# Patient Record
Sex: Female | Born: 1950 | Race: Black or African American | Hispanic: No | Marital: Single | Smoking: Former smoker
Health system: Southern US, Community
[De-identification: ages and names within clinical notes are randomized; demographics above are authoritative.]

## PROBLEM LIST (undated history)

## (undated) DIAGNOSIS — N189 Chronic kidney disease, unspecified: Secondary | ICD-10-CM

## (undated) DIAGNOSIS — G4730 Sleep apnea, unspecified: Secondary | ICD-10-CM

## (undated) DIAGNOSIS — R0602 Shortness of breath: Secondary | ICD-10-CM

## (undated) DIAGNOSIS — N186 End stage renal disease: Secondary | ICD-10-CM

## (undated) DIAGNOSIS — I219 Acute myocardial infarction, unspecified: Secondary | ICD-10-CM

## (undated) DIAGNOSIS — R569 Unspecified convulsions: Secondary | ICD-10-CM

## (undated) DIAGNOSIS — E785 Hyperlipidemia, unspecified: Secondary | ICD-10-CM

## (undated) DIAGNOSIS — J189 Pneumonia, unspecified organism: Secondary | ICD-10-CM

## (undated) DIAGNOSIS — C801 Malignant (primary) neoplasm, unspecified: Secondary | ICD-10-CM

## (undated) DIAGNOSIS — I10 Essential (primary) hypertension: Secondary | ICD-10-CM

## (undated) DIAGNOSIS — M1990 Unspecified osteoarthritis, unspecified site: Secondary | ICD-10-CM

## (undated) DIAGNOSIS — G709 Myoneural disorder, unspecified: Secondary | ICD-10-CM

## (undated) DIAGNOSIS — I2510 Atherosclerotic heart disease of native coronary artery without angina pectoris: Secondary | ICD-10-CM

## (undated) HISTORY — PX: CORONARY ANGIOPLASTY: SHX604

## (undated) HISTORY — PX: OTHER SURGICAL HISTORY: SHX169

## (undated) HISTORY — DX: Hyperlipidemia, unspecified: E78.5

## (undated) HISTORY — DX: Atherosclerotic heart disease of native coronary artery without angina pectoris: I25.10

## (undated) HISTORY — DX: Essential (primary) hypertension: I10

## (undated) HISTORY — PX: ABDOMINAL HYSTERECTOMY: SHX81

## (undated) HISTORY — PX: NECK SURGERY: SHX720

## (undated) HISTORY — PX: ROTATOR CUFF REPAIR: SHX139

## (undated) HISTORY — DX: End stage renal disease: N18.6

---

## 1988-05-07 DIAGNOSIS — J189 Pneumonia, unspecified organism: Secondary | ICD-10-CM

## 1988-05-07 HISTORY — DX: Pneumonia, unspecified organism: J18.9

## 1998-05-07 HISTORY — PX: OTHER SURGICAL HISTORY: SHX169

## 2000-02-11 ENCOUNTER — Encounter: Payer: Self-pay | Admitting: *Deleted

## 2000-02-11 ENCOUNTER — Inpatient Hospital Stay (HOSPITAL_COMMUNITY): Admission: EM | Admit: 2000-02-11 | Discharge: 2000-02-14 | Payer: Self-pay | Admitting: *Deleted

## 2000-05-07 ENCOUNTER — Encounter: Admission: RE | Admit: 2000-05-07 | Discharge: 2000-05-07 | Payer: Self-pay | Admitting: Cardiology

## 2000-06-07 ENCOUNTER — Encounter: Admission: RE | Admit: 2000-06-07 | Discharge: 2000-06-07 | Payer: Self-pay | Admitting: Cardiology

## 2000-07-23 ENCOUNTER — Encounter: Admission: RE | Admit: 2000-07-23 | Discharge: 2000-07-23 | Payer: Self-pay | Admitting: Emergency Medicine

## 2000-07-29 ENCOUNTER — Encounter: Admission: RE | Admit: 2000-07-29 | Discharge: 2000-07-29 | Payer: Self-pay | Admitting: Internal Medicine

## 2003-02-12 ENCOUNTER — Ambulatory Visit (HOSPITAL_COMMUNITY): Admission: RE | Admit: 2003-02-12 | Discharge: 2003-02-12 | Payer: Self-pay | Admitting: Internal Medicine

## 2003-02-12 ENCOUNTER — Encounter: Payer: Self-pay | Admitting: Internal Medicine

## 2003-09-10 ENCOUNTER — Ambulatory Visit (HOSPITAL_COMMUNITY): Admission: RE | Admit: 2003-09-10 | Discharge: 2003-09-10 | Payer: Self-pay | Admitting: Internal Medicine

## 2004-05-15 ENCOUNTER — Ambulatory Visit: Payer: Self-pay | Admitting: Cardiology

## 2004-07-04 ENCOUNTER — Ambulatory Visit (HOSPITAL_BASED_OUTPATIENT_CLINIC_OR_DEPARTMENT_OTHER): Admission: RE | Admit: 2004-07-04 | Discharge: 2004-07-04 | Payer: Self-pay | Admitting: Ophthalmology

## 2004-07-11 ENCOUNTER — Ambulatory Visit (HOSPITAL_COMMUNITY): Admission: RE | Admit: 2004-07-11 | Discharge: 2004-07-11 | Payer: Self-pay | Admitting: Internal Medicine

## 2005-02-22 ENCOUNTER — Ambulatory Visit (HOSPITAL_COMMUNITY): Admission: RE | Admit: 2005-02-22 | Discharge: 2005-02-22 | Payer: Self-pay | Admitting: Unknown Physician Specialty

## 2005-03-02 ENCOUNTER — Encounter: Admission: RE | Admit: 2005-03-02 | Discharge: 2005-03-02 | Payer: Self-pay | Admitting: Unknown Physician Specialty

## 2005-03-16 ENCOUNTER — Encounter: Admission: RE | Admit: 2005-03-16 | Discharge: 2005-03-16 | Payer: Self-pay | Admitting: Unknown Physician Specialty

## 2005-04-09 ENCOUNTER — Encounter: Admission: RE | Admit: 2005-04-09 | Discharge: 2005-04-09 | Payer: Self-pay | Admitting: Unknown Physician Specialty

## 2005-05-22 ENCOUNTER — Ambulatory Visit: Payer: Self-pay | Admitting: *Deleted

## 2005-08-09 ENCOUNTER — Ambulatory Visit (HOSPITAL_COMMUNITY): Admission: RE | Admit: 2005-08-09 | Discharge: 2005-08-09 | Payer: Self-pay | Admitting: Internal Medicine

## 2005-10-07 ENCOUNTER — Inpatient Hospital Stay (HOSPITAL_COMMUNITY): Admission: EM | Admit: 2005-10-07 | Discharge: 2005-10-09 | Payer: Self-pay | Admitting: Cardiology

## 2005-10-07 ENCOUNTER — Encounter: Payer: Self-pay | Admitting: Emergency Medicine

## 2005-10-07 ENCOUNTER — Ambulatory Visit: Payer: Self-pay | Admitting: *Deleted

## 2005-10-23 ENCOUNTER — Ambulatory Visit: Payer: Self-pay | Admitting: *Deleted

## 2005-11-09 ENCOUNTER — Emergency Department (HOSPITAL_COMMUNITY): Admission: EM | Admit: 2005-11-09 | Discharge: 2005-11-09 | Payer: Self-pay | Admitting: Emergency Medicine

## 2005-11-24 ENCOUNTER — Emergency Department (HOSPITAL_COMMUNITY): Admission: EM | Admit: 2005-11-24 | Discharge: 2005-11-24 | Payer: Self-pay | Admitting: Emergency Medicine

## 2005-12-13 ENCOUNTER — Ambulatory Visit: Payer: Self-pay | Admitting: Orthopedic Surgery

## 2006-01-08 ENCOUNTER — Encounter: Admission: RE | Admit: 2006-01-08 | Discharge: 2006-01-08 | Payer: Self-pay | Admitting: Orthopedic Surgery

## 2006-01-11 ENCOUNTER — Ambulatory Visit: Payer: Self-pay | Admitting: Cardiology

## 2006-01-24 ENCOUNTER — Ambulatory Visit: Payer: Self-pay | Admitting: Orthopedic Surgery

## 2006-01-28 ENCOUNTER — Encounter: Admission: RE | Admit: 2006-01-28 | Discharge: 2006-01-28 | Payer: Self-pay | Admitting: Orthopedic Surgery

## 2006-02-21 ENCOUNTER — Encounter: Admission: RE | Admit: 2006-02-21 | Discharge: 2006-02-21 | Payer: Self-pay | Admitting: Orthopedic Surgery

## 2006-03-04 ENCOUNTER — Encounter: Admission: RE | Admit: 2006-03-04 | Discharge: 2006-03-04 | Payer: Self-pay | Admitting: Orthopaedic Surgery

## 2006-04-03 ENCOUNTER — Encounter: Admission: RE | Admit: 2006-04-03 | Discharge: 2006-04-03 | Payer: Self-pay | Admitting: Orthopaedic Surgery

## 2006-07-30 ENCOUNTER — Ambulatory Visit (HOSPITAL_BASED_OUTPATIENT_CLINIC_OR_DEPARTMENT_OTHER): Admission: RE | Admit: 2006-07-30 | Discharge: 2006-07-31 | Payer: Self-pay | Admitting: Orthopedic Surgery

## 2006-11-12 ENCOUNTER — Ambulatory Visit: Payer: Self-pay | Admitting: Cardiovascular Disease

## 2006-11-12 ENCOUNTER — Ambulatory Visit (HOSPITAL_COMMUNITY): Admission: RE | Admit: 2006-11-12 | Discharge: 2006-11-12 | Payer: Self-pay | Admitting: Family Medicine

## 2006-11-19 ENCOUNTER — Ambulatory Visit: Payer: Self-pay | Admitting: Cardiovascular Disease

## 2006-11-19 ENCOUNTER — Encounter (HOSPITAL_COMMUNITY): Admission: RE | Admit: 2006-11-19 | Discharge: 2006-12-19 | Payer: Self-pay | Admitting: Cardiovascular Disease

## 2006-11-21 ENCOUNTER — Ambulatory Visit (HOSPITAL_COMMUNITY): Admission: RE | Admit: 2006-11-21 | Discharge: 2006-11-21 | Payer: Self-pay | Admitting: Cardiovascular Disease

## 2006-11-26 ENCOUNTER — Ambulatory Visit: Payer: Self-pay | Admitting: Cardiovascular Disease

## 2006-11-26 ENCOUNTER — Inpatient Hospital Stay (HOSPITAL_BASED_OUTPATIENT_CLINIC_OR_DEPARTMENT_OTHER): Admission: RE | Admit: 2006-11-26 | Discharge: 2006-11-26 | Payer: Self-pay | Admitting: Cardiovascular Disease

## 2007-01-03 ENCOUNTER — Inpatient Hospital Stay (HOSPITAL_COMMUNITY): Admission: RE | Admit: 2007-01-03 | Discharge: 2007-01-04 | Payer: Self-pay | Admitting: Orthopaedic Surgery

## 2007-02-11 ENCOUNTER — Ambulatory Visit: Payer: Self-pay | Admitting: Cardiovascular Disease

## 2007-10-31 ENCOUNTER — Ambulatory Visit: Payer: Self-pay | Admitting: Cardiovascular Disease

## 2007-11-14 ENCOUNTER — Ambulatory Visit (HOSPITAL_COMMUNITY): Admission: RE | Admit: 2007-11-14 | Discharge: 2007-11-14 | Payer: Self-pay | Admitting: Family Medicine

## 2008-07-29 ENCOUNTER — Ambulatory Visit: Payer: Self-pay | Admitting: Cardiology

## 2008-09-29 DIAGNOSIS — E782 Mixed hyperlipidemia: Secondary | ICD-10-CM | POA: Insufficient documentation

## 2008-09-29 DIAGNOSIS — I2510 Atherosclerotic heart disease of native coronary artery without angina pectoris: Secondary | ICD-10-CM | POA: Insufficient documentation

## 2008-09-29 DIAGNOSIS — I10 Essential (primary) hypertension: Secondary | ICD-10-CM

## 2008-10-01 ENCOUNTER — Ambulatory Visit: Payer: Self-pay | Admitting: Cardiovascular Disease

## 2008-11-17 ENCOUNTER — Other Ambulatory Visit: Admission: RE | Admit: 2008-11-17 | Discharge: 2008-11-17 | Payer: Self-pay | Admitting: Orthopaedic Surgery

## 2009-04-28 ENCOUNTER — Emergency Department (HOSPITAL_COMMUNITY): Admission: EM | Admit: 2009-04-28 | Discharge: 2009-04-28 | Payer: Self-pay | Admitting: Emergency Medicine

## 2009-06-07 ENCOUNTER — Ambulatory Visit (HOSPITAL_COMMUNITY): Admission: RE | Admit: 2009-06-07 | Discharge: 2009-06-07 | Payer: Self-pay | Admitting: Family Medicine

## 2009-06-07 ENCOUNTER — Encounter (INDEPENDENT_AMBULATORY_CARE_PROVIDER_SITE_OTHER): Payer: Self-pay | Admitting: *Deleted

## 2009-06-07 LAB — CONVERTED CEMR LAB
Alkaline Phosphatase: 67 units/L
CO2: 21 meq/L
Creatinine, Ser: 1.07 mg/dL
Free T4: 11.1 ng/dL
Glucose, Bld: 273 mg/dL
TSH: 1.488 microintl units/mL

## 2009-06-10 ENCOUNTER — Encounter (INDEPENDENT_AMBULATORY_CARE_PROVIDER_SITE_OTHER): Payer: Self-pay | Admitting: *Deleted

## 2009-06-22 ENCOUNTER — Ambulatory Visit: Payer: Self-pay | Admitting: Cardiovascular Disease

## 2009-10-28 ENCOUNTER — Emergency Department (HOSPITAL_COMMUNITY): Admission: EM | Admit: 2009-10-28 | Discharge: 2009-10-28 | Payer: Self-pay | Admitting: Emergency Medicine

## 2009-10-30 ENCOUNTER — Emergency Department (HOSPITAL_COMMUNITY): Admission: EM | Admit: 2009-10-30 | Discharge: 2009-10-30 | Payer: Self-pay | Admitting: Emergency Medicine

## 2010-05-28 ENCOUNTER — Encounter: Payer: Self-pay | Admitting: Orthopedic Surgery

## 2010-06-06 NOTE — Assessment & Plan Note (Signed)
Summary: past due for 6 mth f/u per pt phone call/tg   Visit Type:  Follow-up Primary Provider:  DR.DONDIEGO  CC:  NO COMPLAINTS.  History of Present Illness: [name] returns today for followup.  She has known coronary disease.  Shehas a total right coronary artery with collaterals.  She does not havecritical left-sided disease.  I recall doing her catheterization in July2008.  She did not have a lot of insight into her coronary disease.  Itook pictures for and explained to her her anatomy. Her BS have been well controlled according to her.  She does not know her A1c but it was just checked cy [facility]go.  She Denies SSCP, palpitations, or dyspnea.  She has been compliant with her meds.  She is active.  With a birthday coming up on the 29[birth year].    Current Problems (verified): 1)  Mixed Hyperlipidemia  (ICD-272.2) 2)  Hypertension  (ICD-401.9) 3)  Cad  (ICD-414.00)  Current Medications (verified): 1)  Vytorin 10-10 Mg Tabs (Ezetimibe-Simvastatin) .... Take 1 Tab Daily 2)  Cosopt 2-0.5 % Soln (Dorzolamide-Timolol) .... Place 1 Drop Each Eye Two Times A Day 3)  Fish Oil Maximum Strength 1200 Mg Caps (Omega-3 Fatty Acids) .... Take 1 Cap Daily 4)  Imdur 30 Mg Xr24h-Tab (Isosorbide Mononitrate) .... Take 1 Tab Daily 5)  Metoprolol Succinate 100 Mg Xr24h-Tab (Metoprolol Succinate) .... Take 1 Tab Daily 6)  Metformin Hcl 500 Mg Tabs (Metformin Hcl) .... Take 1 Tab Daily 7)  Hydrochlorothiazide 25 Mg Tabs (Hydrochlorothiazide) .... Take 1 Tab Daily 8)  Aspirin Adult Low Strength 81 Mg Tbec (Aspirin) .... Take 1 Tab Daily 9)  Nitro-Dur 0.4 Mg/hr Pt24 (Nitroglycerin) .... Take As Needed For Chestpain 10)  Altace 2.5 Mg Caps (Ramipril) .... Take 1 Tab Daily 11)  Daily Multi  Tabs (Multiple Vitamins-Minerals) .... Take 1 Tab Daily  Allergies (verified): No Known Drug Allergies  Past History:  Past Medical History: Last updated: 09/29/2008 Current Problems:  MIXED HYPERLIPIDEMIA  (ICD-272.2) HYPERTENSION (ICD-401.9) CAD (ICD-414.00)  Past Surgical History: Last updated: 09/29/2008 cath (ptca +stent) mid RCA EF 40-45%  Family History: Last updated: 09/29/2008 Father:alive with cad Mother:deceased  Social History: Last updated: 09/29/2008 Single  Tobacco Use - Former.  Alcohol Use - no Regular Exercise - no Drug Use - yes  Review of Systems       Denies fever, malais, weight loss, blurry vision, decreased visual acuity, cough, sputum, SOB, hemoptysis, pleuritic pain, palpitaitons, heartburn, abdominal pain, melena, lower extremity edema, claudication, or rash. All other systems reviewed and negative  Vital Signs:  Patient profile:   60 year old female Height:      59 inches Weight:      164 pounds BMI:     33.24 Pulse rate:   79 / minute BP sitting:   134 / 75  (right arm)  Vitals Entered By: Doretha Sou, CNA (June 22, 2009 9:40 AM)  Physical Exam  General:  Affect appropriate Healthy:  appears stated age 10: normal Neck supple with no adenopathy JVP normal no bruits no thyromegaly Lungs clear with no wheezing and good diaphragmatic motion Heart:  S1/S2 no murmur,rub, gallop or click PMI normal Abdomen: benighn, BS positve, no tenderness, no AAA no bruit.  No HSM or HJR Distal pulses intact with no bruits No edema Neuro non-focal Skin warm and dry    Impression & Recommendations:  Problem # 1:  CAD (ICD-414.00) Stable no aninga continue ASA and BB Her updated medication list for  this problem includes:    Imdur 30 Mg Xr24h-tab (Isosorbide mononitrate) .Marland Kitchen... Take 1 tab daily    Metoprolol Succinate 100 Mg Xr24h-tab (Metoprolol succinate) .Marland Kitchen... Take 1 tab daily    Aspirin Adult Low Strength 81 Mg Tbec (Aspirin) .Marland Kitchen... Take 1 tab daily    Nitro-dur 0.4 Mg/hr Pt24 (Nitroglycerin) .Marland Kitchen... Take as needed for chestpain    Altace 2.5 Mg Caps (Ramipril) .Marland Kitchen... Take 1 tab daily  Problem # 2:  HYPERTENSION (ICD-401.9) WEll controlled  continue ACE given DM Her updated medication list for this problem includes:    Metoprolol Succinate 100 Mg Xr24h-tab (Metoprolol succinate) .Marland Kitchen... Take 1 tab daily    Hydrochlorothiazide 25 Mg Tabs (Hydrochlorothiazide) .Marland Kitchen... Take 1 tab daily    Aspirin Adult Low Strength 81 Mg Tbec (Aspirin) .Marland Kitchen... Take 1 tab daily    Altace 2.5 Mg Caps (Ramipril) .Marland Kitchen... Take 1 tab daily  Problem # 3:  MIXED HYPERLIPIDEMIA (ICD-272.2) Will check labs from primary office.  Disucssed low carb diet with patient Her updated medication list for this problem includes:    Vytorin 10-10 Mg Tabs (Ezetimibe-simvastatin) .Marland Kitchen... Take 1 tab daily  CHOL: 221 (06/07/2009)   HDL: 31 (06/07/2009)   TG: 485 (06/07/2009)  Patient Instructions: 1)  Your physician recommends that you schedule a follow-up appointment in: 6 months

## 2010-06-06 NOTE — Miscellaneous (Signed)
Summary: LABS CMP,LIPIDS,TSH,T4 06/07/09  Clinical Lists Changes

## 2010-06-06 NOTE — Miscellaneous (Signed)
Summary: LABS CMP,LIPIDS,A1C,T4,TSH 06/07/2009  Clinical Lists Changes  Observations: Added new observation of CALCIUM: 10.0 mg/dL (06/07/2009 14:48) Added new observation of SGPT (ALT): 45 units/L (06/07/2009 14:48) Added new observation of SGOT (AST): 26 units/L (06/07/2009 14:48) Added new observation of ALK PHOS: 67 units/L (06/07/2009 14:48) Added new observation of CREATININE: 1.07 mg/dL (06/07/2009 14:48) Added new observation of BUN: 22 mg/dL (06/07/2009 14:48) Added new observation of BG RANDOM: 273 mg/dL (06/07/2009 14:48) Added new observation of CO2 PLSM/SER: 21 meq/L (06/07/2009 14:48) Added new observation of CL SERUM: 101 meq/L (06/07/2009 14:48) Added new observation of K SERUM: 4.4 meq/L (06/07/2009 14:48) Added new observation of NA: 137 meq/L (06/07/2009 14:48) Added new observation of HDL: 31 mg/dL (06/07/2009 14:48) Added new observation of TRIGLYC TOT: 485 mg/dL (06/07/2009 14:48) Added new observation of CHOLESTEROL: 221 mg/dL (06/07/2009 14:48) Added new observation of TSH: 1.488 microintl units/mL (06/07/2009 14:48) Added new observation of T4, FREE: 11.1 ng/dL (06/07/2009 14:48) Added new observation of HGBA1C: 11.6 % (06/07/2009 14:48)

## 2010-07-12 ENCOUNTER — Ambulatory Visit (INDEPENDENT_AMBULATORY_CARE_PROVIDER_SITE_OTHER): Payer: Medicare Other | Admitting: Cardiovascular Disease

## 2010-07-12 ENCOUNTER — Encounter: Payer: Self-pay | Admitting: Cardiovascular Disease

## 2010-07-12 DIAGNOSIS — I10 Essential (primary) hypertension: Secondary | ICD-10-CM

## 2010-07-12 DIAGNOSIS — I2510 Atherosclerotic heart disease of native coronary artery without angina pectoris: Secondary | ICD-10-CM

## 2010-07-12 DIAGNOSIS — E782 Mixed hyperlipidemia: Secondary | ICD-10-CM

## 2010-07-12 DIAGNOSIS — E119 Type 2 diabetes mellitus without complications: Secondary | ICD-10-CM

## 2010-07-12 DIAGNOSIS — E7800 Pure hypercholesterolemia, unspecified: Secondary | ICD-10-CM

## 2010-07-12 NOTE — Progress Notes (Signed)
[name] returns today for followup.  She has known coronary disease.  Shehas a total right coronary artery with collaterals.  She does not havecritical left-sided disease.  I recall doing her catheterization in July2008.  She did not have a lot of insight into her coronary disease.  Itook pictures for and explained to her her anatomy. .  She Denies SSCP, palpitations, or dyspnea.  She has been compliant with her meds.  She is active.   ]She indicates that her BS in better controlled with A1c down to 8 from 11.  Walks with her mini daschund zodie   ROS: Denies fever, malais, weight loss, blurry vision, decreased visual acuity, cough, sputum, SOB, hemoptysis, pleuritic pain, palpitaitons, heartburn, abdominal pain, melena, lower extremity edema, claudication, or rash.   General:  Affect appropriate Healthy:  appears stated age 10: normal Neck supple with no adenopathy JVP normal no bruits no thyromegaly Lungs clear with no wheezing and good diaphragmatic motion Heart:  S1/S2 no murmur,rub, gallop or click PMI normal Abdomen: benighn, BS positve, no tenderness, no AAA no bruit.  No HSM or HJR Distal pulses intact with no bruits No edema Neuro non-focal Skin warm and dry No muscular weakness

## 2010-07-12 NOTE — Assessment & Plan Note (Signed)
Well controlled continue vytorin

## 2010-07-12 NOTE — Progress Notes (Deleted)
Subjective:      Patient ID: [name] is a 60 y.o. female.  Chief Complaint: HPI {Common ambulatory SmartLinks:[id number]} ROS    Objective:    Physical Exam  Lab Review:  {Recent 0000000 applicable"}    Assessment:     No diagnosis found.   Plan:     ***

## 2010-07-12 NOTE — Patient Instructions (Signed)
F/U Dr [name] 12 months

## 2010-07-12 NOTE — Assessment & Plan Note (Signed)
Stable no angina.  Continue nitrates given collaterals

## 2010-07-12 NOTE — Assessment & Plan Note (Signed)
Well controlled continue ACE and other meds

## 2010-07-18 NOTE — Assessment & Plan Note (Signed)
Summary: 6 mth f/u per pt call/tmj/tg   Visit Type:  Follow-up Primary Provider:  DR.DONDIEGO  CC:  some sob.  History of Present Illness: [name] returns today for followup.  She has known coronary disease.  Shehas a total right coronary artery with collaterals.  She does not havecritical left-sided disease.  I recall doing her catheterization in July2008.  She did not have a lot of insight into her coronary disease.  Itook pictures for and explained to her her anatomy. Her BS have been well controlled according to her.  She does not know her A1c but it was just checked cy DonDiego.  She Denies SSCP, palpitations, or dyspnea.  She has been compliant with her meds.  She is active.    Current Problems (verified): 1)  Mixed Hyperlipidemia  (ICD-272.2) 2)  Hypertension  (ICD-401.9) 3)  Cad  (ICD-414.00)  Current Medications (verified): 1)  Vytorin 10-10 Mg Tabs (Ezetimibe-Simvastatin) .... Take 1 Tab Daily 2)  Cosopt 2-0.5 % Soln (Dorzolamide-Timolol) .... Place 1 Drop Each Eye Two Times A Day 3)  Fish Oil Maximum Strength 1200 Mg Caps (Omega-3 Fatty Acids) .... Take 1 Cap Daily 4)  Imdur 30 Mg Xr24h-Tab (Isosorbide Mononitrate) .... Take 1 Tab Daily 5)  Metoprolol Succinate 100 Mg Xr24h-Tab (Metoprolol Succinate) .... Take 1 Tab Daily 6)  Metformin Hcl 500 Mg Tabs (Metformin Hcl) .... Take 1 Tab Two Times A Day 7)  Hydrochlorothiazide 25 Mg Tabs (Hydrochlorothiazide) .... Take 1/2 Tab Daily 8)  Aspirin Adult Low Strength 81 Mg Tbec (Aspirin) .... Take 1 Tab Daily 9)  Nitro-Dur 0.4 Mg/hr Pt24 (Nitroglycerin) .... Take As Needed For Chestpain 10)  Altace 10 Mg Caps (Ramipril) .... Take 1 Tab Daily 11)  Daily Multi  Tabs (Multiple Vitamins-Minerals) .... Take 1 Tab Daily 12)  Nabumetone 750 Mg Tabs (Nabumetone) .... Take 1 Tab Two Times A Day 13)  Glipizide 5 Mg Tabs (Glipizide) .... Take 1 Tab Daily 14)  Co Q-10 200 Mg Caps (Coenzyme Q10) .... Take 1 Tab Daily 15)  L-Carnitine 500 Mg Tabs  (Levocarnitine) .... Take 1 Tab Daily 16)  Calcium-Magnesium-Zinc 333-133-8.3 Mg Tabs (Calcium-Magnesium-Zinc) .... Take 1 Tab Daily 17)  Daily Multi  Tabs (Multiple Vitamins-Minerals) .... Take 1 Tab Daily 18)  Cinnamon 500 Mg Caps (Cinnamon) .... Take 1 Tab Daily 19)  Chromium 200 Mcg Tabs (Chromium) .... Take 1 Tab Daily 20)  Alpha-Lipoic Acid 50 Mg Caps (Alpha-Lipoic Acid) .... Take 1 Tab Daily 21)  Lutein 22)  Ginkgo Biloba 40 Mg Caps (Ginkgo Biloba) .... Take 1 Tab Daily 23)  Vitamin B-12 1000 Mcg Tabs (Cyanocobalamin) .... Take 1 Tab Daily  Allergies (verified): No Known Drug Allergies  Comments:  Nurse/Medical Assistant: brought meds and list [facility]  Past History:  Past Medical History: Last updated: 09/29/2008 Current Problems:  MIXED HYPERLIPIDEMIA (ICD-272.2) HYPERTENSION (ICD-401.9) CAD (ICD-414.00)  Past Surgical History: Last updated: 09/29/2008 cath (ptca +stent) mid RCA EF 40-45%  Family History: Last updated: 09/29/2008 Father:alive with cad Mother:deceased  Social History: Last updated: 09/29/2008 Single  Tobacco Use - Former.  Alcohol Use - no Regular Exercise - no Drug Use - yes  Review of Systems       Denies fever, malais, weight loss, blurry vision, decreased visual acuity, cough, sputum, SOB, hemoptysis, pleuritic pain, palpitaitons, heartburn, abdominal pain, melena, lower extremity edema, claudication, or rash.   Vital Signs:  Patient profile:   60 year old female Weight:  164 pounds BMI:     33.24 Pulse rate:   88 / minute BP sitting:   146 / 83  (left arm)  Vitals Entered By: Doretha Sou, CNA (July 12, 2010 10:04 AM)  Physical Exam  General:  Affect appropriate Healthy:  appears stated age 56: normal Neck supple with no adenopathy JVP normal no bruits no thyromegaly Lungs clear with no wheezing and good diaphragmatic motion Heart:  S1/S2 no murmur,rub, gallop or click PMI  normal Abdomen: benighn, BS positve, no tenderness, no AAA no bruit.  No HSM or HJR Distal pulses intact with no bruits No edema Neuro non-focal Skin warm and dry    Impression & Recommendations:  Problem # 1:  MIXED HYPERLIPIDEMIA (ICD-272.2)  Continue Vytorin  Discussed low fat diet.  F/U labs Dr Cindie Laroche Her updated medication list for this problem includes:    Vytorin 10-10 Mg Tabs (Ezetimibe-simvastatin) .Marland Kitchen... Take 1 tab daily  CHOL: 221 (06/07/2009)   HDL: 31 (06/07/2009)   TG: 485 (06/07/2009)  Her updated medication list for this problem includes:    Vytorin 10-10 Mg Tabs (Ezetimibe-simvastatin) .Marland Kitchen... Take 1 tab daily  Problem # 2:  HYPERTENSION (ICD-401.9) Well controlled  Her updated medication list for this problem includes:    Metoprolol Succinate 100 Mg Xr24h-tab (Metoprolol succinate) .Marland Kitchen... Take 1 tab daily    Hydrochlorothiazide 25 Mg Tabs (Hydrochlorothiazide) .Marland Kitchen... Take 1/2 tab daily    Aspirin Adult Low Strength 81 Mg Tbec (Aspirin) .Marland Kitchen... Take 1 tab daily    Altace 10 Mg Caps (Ramipril) .Marland Kitchen... Take 1 tab daily  Problem # 3:  CAD (ICD-414.00)  Total RCA with collaterals  No angina  Continue asa, bb and nitrates Her updated medication list for this problem includes:    Imdur 30 Mg Xr24h-tab (Isosorbide mononitrate) .Marland Kitchen... Take 1 tab daily    Metoprolol Succinate 100 Mg Xr24h-tab (Metoprolol succinate) .Marland Kitchen... Take 1 tab daily    Aspirin Adult Low Strength 81 Mg Tbec (Aspirin) .Marland Kitchen... Take 1 tab daily    Nitro-dur 0.4 Mg/hr Pt24 (Nitroglycerin) .Marland Kitchen... Take as needed for chestpain    Altace 10 Mg Caps (Ramipril) .Marland Kitchen... Take 1 tab daily  Her updated medication list for this problem includes:    Imdur 30 Mg Xr24h-tab (Isosorbide mononitrate) .Marland Kitchen... Take 1 tab daily    Metoprolol Succinate 100 Mg Xr24h-tab (Metoprolol succinate) .Marland Kitchen... Take 1 tab daily    Aspirin Adult Low Strength 81 Mg Tbec (Aspirin) .Marland Kitchen... Take 1 tab daily    Nitro-dur 0.4 Mg/hr Pt24 (Nitroglycerin)  .Marland Kitchen... Take as needed for chestpain    Altace 10 Mg Caps (Ramipril) .Marland Kitchen... Take 1 tab daily

## 2010-07-23 LAB — CULTURE, ROUTINE-ABSCESS

## 2010-08-07 LAB — SEDIMENTATION RATE: Sed Rate: 12 mm/hr (ref 0–22)

## 2010-08-09 ENCOUNTER — Other Ambulatory Visit: Payer: Self-pay | Admitting: Orthopaedic Surgery

## 2010-08-09 ENCOUNTER — Other Ambulatory Visit (HOSPITAL_COMMUNITY)
Admission: RE | Admit: 2010-08-09 | Discharge: 2010-08-09 | Disposition: A | Discharge status: Home / Self Care | Payer: Medicare Other | Source: Ambulatory Visit | Attending: Orthopaedic Surgery | Admitting: Orthopaedic Surgery

## 2010-08-09 DIAGNOSIS — M6790 Unspecified disorder of synovium and tendon, unspecified site: Secondary | ICD-10-CM | POA: Insufficient documentation

## 2010-08-09 DIAGNOSIS — M719 Bursopathy, unspecified: Secondary | ICD-10-CM | POA: Insufficient documentation

## 2010-09-19 NOTE — Assessment & Plan Note (Signed)
[name]   [name], [name]                      MRN:          [id number]  DATE:11/12/2006                            DOB:          Sep 26, 1950    [name] is seen today as a new patient by me.  She has previously  been seen by Dr. Wilhemina Cash, and most recently by Dr. Verl Blalock in September  2007.   She had a heart catheterization in June 2007.  At that time, she had  left-to-right and right-to-right collaterals to an occluded right.  She  had a previous stent there which was totally occluded.  She had a 60%  moderate mid LAD lesion.  Her EF was 50% with inferior akinesis.  She  has not had a followup stress test since then.  She has cervical spine  problems, and is actually preoperative for possible disk surgery in  August or September.  She has not had any significant chest pain.  She  has mild exertional dyspnea, which is stable.  She is able to walk quite  well.  She, unfortunately, does not typically get angina, and mostly had  shortness of breath and diaphoresis with her MI.   REVIEW OF SYSTEMS:  The patient has noted increasing lower extremity  edema.  She has had some chronic left lower extremity edema from  previous fasciitis from a boil 5 years ago, but she has noted increased  swelling in the right ankle.  There has been no history of DVT.  She has  been fairly compliant with her diabetic diet, but does use salt.  Review  of systems otherwise negative.   CURRENT MEDICATIONS:  1. Vytorin 10/10.  2. Altace 10 a day.  3. Metformin 1 b.i.d.  4. She has stopped her Actos, and is now on Januvia.  She does not      know the dose.  5. Imdur 30 a day.  6. Hydrochlorothiazide 12.5 a day.  7. Lantus as indicated 15 units at night.  8. Lopressor 100 mg b.i.d.   EXAMINATION:  Remarkable for a fairly healthy-appearing middle aged  black female in no distress.  Affect is appropriate.  Weight is actually down from 198 in September 2007 to 182.  Blood  pressure is 124/78.  Respiratory rate is 14.  Pulse 78 and regular.  HEENT:  Normal.  There is no thyromegaly.  No lymphadenopathy.  No bruits.  NECK:  Supple.  There is no JVP elevation.  LUNGS:  Clear with good diaphragmatic motion.  There is an S1 and S2 with normal heart sounds.  PMI is normal.  ABDOMEN:  Benign.  Bowel sounds are positive.  No hepatosplenomegaly.  No hepatojugular reflux.  No tenderness.  No organomegaly.  Femorals are +2 bilaterally.  She has a tattoo in the medial aspect of  her left ankle.  She has +1 edema on the left and +2 edema, particularly  on the outside of the lateral malleolus in the right leg.  She has a  peripheral neuropathy below the ankles.  Neurologic was otherwise  nonfocal.  There  was no muscular weakness.   IMPRESSION:  1. Coronary artery disease.  My biggest concern is that she has      moderate left anterior descending disease from previous      catheterization, which supplies collateral to the right.  She does      not have a good warning sign, and she is preoperative.  She will      have an adenosine Myoview.  She will continue her aspirin and beta      blocker therapy.  2. Decreased left ventricular function.  Continue ACE inhibitor.  We      will reassess her left ventricular function by Myoview.  She may      need a followup echo.  3. Lower extremity edema.  It sounds dependent on the right side.  She      may have had some ankle injury, as there is fairly focal swelling      outside of the ankle.  For the time being, we will increase her      hydrochlorothiazide to 25 a day.  I will see her back after her      stress test, and we may do a BMET and BNP to further assess this.  4. Diabetes.  I am glad she is off her Actos.  She will continue her      current medications and follow up with Dr. Legrand Rams for quarterly      hemoglobin A1c.  5. Mixed hyperlipidemia.  Stable.   Continue low-fat diet.  Continue      fish oil and Vytorin.  Again, I will see her back after her stress      test to further assess her risk for surgical spine surgery.     [name]. [name], MD, [facility]  Electronically Signed    PCN/MedQ  DD: 11/12/2006  DT: 11/12/2006  Job #: [id number]

## 2010-09-19 NOTE — Assessment & Plan Note (Signed)
[facility] OFFICE NOTE   Amanda, May                      MRN:          [id number]  DATE:10/01/2008                            DOB:          February 16, 1951    [name] returns today for followup.  She has known coronary disease.  She  has a total right coronary artery with collaterals.  She does not have  critical left-sided disease.  I recall doing her catheterization in July  2008.  She did not have a lot of insight into her coronary disease.  I  took pictures for and explained to her her anatomy.   She likes to come to the [facility] as Dr. Cindie Laroche is her medical  doctor.   She has not had a Myoview study since July 2008 when she had her heart  cath.   She is active.  She is under a little bit less stress as her daughter  moved out and she is living in an apartment by herself now.  Her sugar  apparently has been reasonably controlled.  She has not had a change in  her medication.  Unfortunately, she continues to smoke.  I counseled her  on this for less than 10 minutes and I explained to her the relationship  between coronary disease progression and smoking.  She only smokes a  pack every 2 days.  She was just given her prescription for Chantix by  Dr. Cindie Laroche, and I concur with this therapy for her.  I warned her  about sleeping problems and nightmares.   She was seen to be a good candidate for Chantix, otherwise, she is  fairly sedentary.  She is not getting chest pain, PND, or orthopnea.  There has been no diaphoresis.  There has been trace lower extremity  edema.   Her past medical history is otherwise remarkable for hypertension,  diabetes, mixed hyperlipidemia, known coronary artery disease with a  total right coronary artery that is collateralized.   She lives by herself.  She has family in the area.  She is on disability  for previous fasciitis and left lower extremity.  She  smokes a pack  every 2 days and does not drink.   MEDICATIONS:  1. She is on Vytorin 10/10.  2. Altace 10 mg a day.  3. Fish oil.  4. Imdur 60 mg a day.  5. Metoprolol 100 a day.  6. Januvia 100 mics daily.  7. Metformin 500 b.i.d.  8. HCTZ 25 a day.  9. Relafen.   PHYSICAL EXAMINATION:  GENERAL:  Her exam is remarkable for an  overweight black female in no distress.  Affect is appropriate.  VITAL SIGNS:  Weight is 168, blood pressure 140/80, pulse 77 regular,  respiratory rate 14, afebrile.  HEENT:  Unremarkable.  Carotids are normal without bruit.  No  lymphadenopathy, thyromegaly, or JVP elevation.  LUNGS:  Clear.  Good diaphragmatic motion.  No wheezing.  S1 and S2 with  normal heart sounds.  PMI normal.  ABDOMEN:  Benign.  Bowel sounds positive.  No AAA.  No tenderness.  No  bruit.  No hepatosplenomegaly, hepatojugular reflux.  EXTREMITIES:  Edema 1+ bilaterally.  NEURO:  Nonfocal.  SKIN:  Warm and dry.  MUSCULOSKELETAL:  No muscular weakness.   EKG shows sinus rhythm with poor R-wave progression and previous  inferior wall MI.   IMPRESSION:  1. Coronary disease, previous coronary artery occlusion of the right      with collaterals not having symptoms consider followup Myoview in      the year.  Continue aspirin and beta-blocker.  2. Diabetes.  Followup with Dr. Cindie Laroche.  Hemoglobin A1c quarterly.      Continue current dose of medications.  3. Lower extremity edema, chronic low-sodium diet.  Continue current      dose of HCTZ.  4. Hyperlipidemia.  She tells me Dr. Cindie Laroche checked her liver and      lipid profile and they were within range.  Continue Vytorin.  No      side effects.  5. Hypertension currently well-controlled.  Continue current dose of      Altace.  6. Smoking cessation.  The patient will start Chantix.  She will      follow up with Dr. Cindie Laroche.  She was counseled for less than 10      minutes in smoking cessation.     [name]. [name], MD,  [facility]  Electronically Signed    PCN/MedQ  DD: 10/01/2008  DT: 10/01/2008  Job #: [id number]

## 2010-09-19 NOTE — Cardiovascular Report (Signed)
NAMEANAIE, SEAMON               ACCOUNT NO.:  [id number]   MEDICAL RECORD NO.:  [id number]          PATIENT TYPE:  OIB   LOCATION:  1962                         FACILITY:  [facility]   PHYSICIAN:  Peter C. [name], MD, FACCDATE OF BIRTH:  1951-03-22   DATE OF PROCEDURE:  DATE OF DISCHARGE:                            CARDIAC CATHETERIZATION   PROGRESS NOTE:  Coronary arteriography   INDICATIONS:  Abnormal Myoview with known coronary artery disease.   Cine catheterization was done from the right femoral artery and vein.  A  peripheral IV could not be started.  We therefore placed a 5-French  venous sheath   Left main coronary artery was normal.   Left anterior descending artery had a smooth 50% tubular lesion in the  midvessel.  Distal vessel had 20-30% multiple discrete lesions.  There  were two small diagonal branches without focal lesions.   Circumflex coronary artery primarily consisted of an AV groove branch  and a single obtuse marginal branch.  There was no critical disease.   There is significant left-to-right collaterals to the distal right  coronary artery.   The right coronary artery was known to be totally occluded.  The patient  had the entire mid and distal right coronary artery previously stented.  There was trickle flow through a portion of the stents, however there is  primarily right to right collaterals to the distal vessel.   RAO VENTRICULOGRAPHY:  RAO ventriculography showed minimal inferobasal  hypokinesis, mild left ventricular cavity enlargement.  There is no  significant MR.  The EF was in the 50% range.   Aortic pressure was 160/74, LV pressure was 164/10.   IMPRESSION:  The patient's LAD disease is stable. It actually looks less  stenotic than previously described.  It is very smooth area. I do not  think there is any indication for bypass surgery at this time.   The patient's proximal right coronary artery occlusion is chronic.  She  has good right  to right and left-to-right collaterals.  Continued  medical therapy is warranted.   The patient tolerated the procedure well.      [name]. [name], MD, [facility]  Electronically Signed     PCN/MEDQ  D:  11/26/2006  T:  11/26/2006  Job:  [id number]

## 2010-09-19 NOTE — Op Note (Signed)
NAMECORNIE, BOSCH               ACCOUNT NO.:  [id number]   MEDICAL RECORD NO.:  [id number]          PATIENT TYPE:  INP   LOCATION:  5030                         FACILITY:  [facility]   PHYSICIAN:  Rennis Harding, M.D.        DATE OF BIRTH:  06/25/1950   DATE OF PROCEDURE:  01/03/2007  DATE OF DISCHARGE:  01/04/2007                               OPERATIVE REPORT   DIAGNOSIS:  Cervical spondylotic myeloradiculopathy.   PROCEDURE:  1. Anterior cervical diskectomy C3-4, C4-5 and C5-6 with decompression      of the spinal cord and nerve roots bilaterally.  2. Anterior cervical arthrodesis C3-4, C4-5 and C5-6 with placement of      3 PEEK cages, 1 at each level.  3. Anterior cervical plating C3 through C6 using the [employer] Spine      System.  4. Local autogenous bone graft, supplemented with 1-mL Grafton      allograft.   SURGEON:  Rennis Harding, M.D.   ASSISTANT:  Karenann Cai, P.A.   ANESTHESIA:  General endotracheal.   ESTIMATED BLOOD LOSS:  Minimal.   COMPLICATIONS:  None.   NEEDLE AND SPONGE COUNT:  Correct.   INDICATIONS:  Patient is a pleasant 60 year old female with progressive  neck and upper extremity myeloradiculopathy.  Her imaging study shows  severe disk degeneration and spinal stenosis, most advanced at C3-4, C4-  5 and C5-6, secondary to spondylosis and disk herniations.  She has  failed other attempts at conservative treatment modalities and now  presents for ACDF.  Risks, benefits and alternatives were reviewed and  the patient elected to proceed.   PROCEDURE:  After informed consent, she was taken to the operating room.  She underwent general endotracheal anesthesia without difficulty, given  prophylactic IV antibiotics.  Carefully positioned supine on the  operating room table with Mayfield headrest, 5 pounds halter traction  applied.  The neck was prepped and draped in the usual sterile fashion.  A transverse incision was made in the natural skin crease at the  level  of the thyroid cartilage.  Dissection was carried sharply to the  platysma, and the interval between the SCM and strap muscles medially  was developed down to the prevertebral space.  A spinal needle was then  placed at C3-4.  Intraoperative x-ray was taken to confirm the level.  The esophagus, trachea and carotid sheath were identified and protected  at all times.  The longus colli muscle was elevated out over the C3-4,  C4-5, C5-6 disk spaces bilaterally.  Deep retractors were placed.  Leksell rongeur was then used to remove the large anterior osteophytes  from the disk spaces.  The microscope was then draped and brought into  the field.  Caspar retraction pins were placed at C3, C4, C5, C6.  Starting at C3-4 level, general distraction was applied using the [employer].  Radical diskectomy completed.  The disk material was  degenerative and the disk space was collapsed.  High-speed bur was used  to take down the anterior vertebral joint and posterior vertebral  margins.  This bone was  saved for later autografting.  Using Micro-  Kerrisons and curettes, the posterior longitudinal ligament was taken  down and a subligamentous disk herniation was decompressed from the  spinal canal, which was compressing the thecal sac.  The vertebral  margins were undercut.  When we were done, we had a good decompression  of the cord at this level.  We prepared the cartilaginous endplates for  the arthrodesis.  A 6-mm PEEK cage was packed with the local bone graft  obtained from the drill and also from the anterior osteophytes.  This  was packed with the bone graft, along with 1 mL of allograft paste.  Once the cage was packed of the bone graft mixture, it was inserted in  the interspace and countersunk 1 mm.  We then performed a similar  procedure at C4-5.  At this level, the disk herniation was found to be  larger.  It had migrated up behind the vertebral body, and this was  removed using  a nerve hook.  The vertebral margins were undercut and  wide foraminotomies were again completed.  We again used a 6-mm PEEK  cage at this level, which was packed with the bone graft mixture.  The  cage was inserted into the interspace and countersunk 1 mm.  We then  moved down to the C5-6 level.  Again, the disk material was  degenerative.  The posterior longitudinal ligament was taken down and  the wide foraminotomies were completed.  The posterior vertebral margins  were undercut.  We again found subligamentous disk, which was  compressing the cord.  We were satisfied with the decompression.  We  placed a 6-mm PEEK cage at this level, as well, again with the mixture  of local bone graft and Grafton.  The PEEK cage was countersunk 1 mm.  We then placed a 53-mm anterior cervical plate from C3 to C6.  We used  eight 12-mm screws.  The screw purchase was good, and we ensured that  the locking mechanism engaged.  The wound was irrigated.  Intraoperative  x-ray was taken, which showed appropriate positioning of the  instrumentation from C3 to C6.  The lower portion was hard to visualize  due to the patient's body habitus.  The esophagus, trachea and carotid  sheath were inspected and there were no apparent injuries.  Meticulous  hemostasis was achieved.  A deep TLS drain was left in place.  The  platysma was closed with interrupted 2-0 Vicryl, subcutaneous layer  closed with interrupted 3-0 Vicryl followed by a running 4-0  subcuticular Vicryl suture on the skin edge.  Dermabond was applied.  Cervical collar placed.  The patient was extubated without difficulty  and transferred to recovery in stable condition.   I had originally discussed with the patient using a left anterior iliac  crest bone graft.  While we were prepping her, I noted significant  scarring in her groin and left lower extremity from a previous  necrotizing fasciitis.  In addition, she had a very protuberant abdomen,  and  I was concerned about making an incision in this area, with the  possibility of her developing a postoperative infection.  We, therefore,  elected to use the local bone graft and Grafton mixture.   My assistant, Karenann Cai, P.A., was present throughout the procedure,  including the positioning and the exposure.  She assisted me under the  microscope, providing suction and retraction.  She also assisted with  the wound closure.  Rennis Harding, M.D.  Electronically Signed     MC/MEDQ  D:  01/03/2007  T:  01/04/2007  Job:  [id number]

## 2010-09-19 NOTE — Assessment & Plan Note (Signed)
[place] CARDIOLOGY OFFICE NOTE   MADDYSON, DOSSANTOS                      MRN:          [id number]  DATE:02/11/2007                            DOB:          [birth year]    [name] returns today for followup, and the last time I saw her she  needed pre-op clearance for neck surgery with Dr. Patrice Paradise.  She has an  occluded right coronary artery with collaterals and had moderate LAD  disease.  We also treat her for hypercholesterolemia and edema.   Her heart catheterization was done on November 26, 2006.  She had an  occluded right coronary artery which was filled with good collaterals.  The LAD had a 50% tubular lesion in the mid-vessel, which was quite  smooth and non-flow limiting.  Her EF was 50%.  We cleared her for  surgery.  She did fine.  She was very happy with the result and has much  less pain.   REVIEW OF SYSTEMS:  Currently remarkable for a boil under her right arm.  She is on Avelox for this, and it seems to be improving.  I  congratulated her.  She has lost about 40 pounds over the past year.  She is not having any significant angina, and as indicated her neck  seems to be improving. Edema is baseline on current dose of diuretic.   REVIEW OF SYSTEMS:  Otherwise negative.   CURRENT MEDICATIONS:  Include:  1. Vytorin 10/40.  2. Altace 10 mg a day.  3. Metformin 1 gram b.i.d.  4. Lantus 10 units q.h.s.  5. Fish oil.  6. Imdur 30 a day.  7. Hydrochlorothiazide 12.5 a day.  8. Lopressor 50 b.i.d.  9. Januvia 100 a day.   EXAMINATION:  GENERAL:  Remarkable for a healthy-appearing black female  in no distress.  VITAL SIGNS:  Her blood pressure is 130/70, pulse 68 and regular.  Weight is down to 165.  Respiratory rate is 12.  She is afebrile.  HEENT:  Normal.  NECK:  Carotids normal without bruits.  There is no lymphadenopathy, no  thyromegaly, no JVP elevation.  She has an anterior cervical scar from  her recent surgery.  LUNGS:  Clear.  Good diaphragmatic motion, no wheezing.  There is an S1,  S2 with normal heart sounds.  PMI is normal.  ABDOMEN:  Benign.  Bowel sounds are positive.  No tenderness, no AAA, no  bruits.  No hepatosplenomegaly or hepatojugular reflux.  She is status  post hysterectomy.  EXTREMITIES:  Femorals are +3 bilaterally without bruits.  Cath site is  well healed.  Distal pulses are intact.  She has a tattoo in her left  ankle area.  There is trace lower extremity edema, which is also  markedly improved on hydrochlorothiazide.  NEURO:  Nonfocal.  There is no muscular weakness.  SKIN:  Warm and dry.   IMPRESSION:  1. Coronary disease, stable, 50% LAD disease with collateralized      right.  No complications during surgery.  Continue aspirin and beta      blocker.  2. Hypertension,  currently well controlled.  Continue low-salt diet.      Current dose of Altace is adequate.  3. Hyperlipidemia.  Continue Vytorin [id number], particularly in light of      known coronary disease.  Follow-up lipid and liver profile in 6      months.  4. Boil under right arm, not specifically examined today.  Follow up      with Dr. Legrand Rams, continue Avelox.  She seems to think it is      improving.  5. Cervical spine problems.  Follow up with Dr. Rennis Harding.  Continue      physical therapy and rehab.  Significant improvement with      kyphoplasty.  6. Diabetes.  Continue current doses of oral hypoglycemic and Lantus.      Follow-up hemoglobin A1c quarterly.  See eye doctor on a yearly      basis.  7. Lower extremity edema.  Continue hydrochlorothiazide on the current      dose, markedly improved.  Again, I congratulated the patient on her      marked weight loss over the last 6 months.  I will see her back in      6 month's time.     [name]. [name], MD, [facility]  Electronically Signed    PCN/MedQ  DD: 02/11/2007  DT: 02/11/2007  Job #: [id number]

## 2010-09-19 NOTE — Assessment & Plan Note (Signed)
[name]   [name], [name]                      MRN:          [id number]  DATE:10/31/2007                            DOB:          Nov 01, 1950    [name] returns today for followup.  She has known coronary artery  disease.  We cathed her in July 2008.  At that time, she had an occluded  right coronary artery, which was chronic with 50% LAD disease.  She has  been treated medically.   She has done extremely well.  She is using a diet that involves high  protein and juicing and she has lost about 30 pounds.  I congratulated  her on this.  She is now off Lantus and only on Januvia and metformin  for her sugars.  She has been compliant with her other medications.   She has had a couple of episodes of chest pain since I last saw her;  however, they do not sound anginal.  They were at rest.  They were with  lying down.  They may be related to reflux or dysmotility.  She is  walking on a regular basis with her cane and does not get chest pain.  She had been complaint with her medications.   Her review of systems otherwise is remarkable for improved diabetes  control, the boil under her right arm and her axilla is gone and she is  no longer on antibiotics.  She had a recent trip to [place] to see a  relative with uterine cancer and developed shingles.  This has resolved.   Otherwise, negative.  She is on Vytorin 10/10, Cosopt eye drops, Altace  10 a day, fish oil, Imdur 30 a day, metoprolol 50 b.i.d., Januvia 100 a  day, metformin 500 b.i.d., HCTZ 12.5 b.i.d., and baby aspirin a day.   PHYSICAL EXAMINATION:  GENERAL:  Her exam is remarkable for an  overweight black female.  Weight is down to 166, blood pressure 112/72,  pulse 79 and regular, respiratory rate 14, afebrile.  HEENT:  Unremarkable.  Carotids are normal without bruit.  No  lymphadenopathy, thyromegaly, or JVP elevation.  Status  post anterior  cervical surgery.  LUNGS:  Clear, good diaphragmatic motion.  No wheezing.  CARDIAC:  S1 and S2 with normal heart sounds.  PMI normal.  ABDOMEN:  Benign, bowel sounds positive, no AAA, no bruit, no  hepatosplenomegaly, no hepatojugular reflux.  EXTREMITIES:  Distal pulses are intact.  No edema.  NEURO:  Nonfocal.  SKIN:  Warm and dry.  No muscular weakness.   IMPRESSION:  1. Coronary artery disease, chronic total occlusion of the right with      collaterals, 50% LAD disease.  Continue aspirin, beta blocker, and      long-acting nitrates, probable follow-up stress test in a year.  2. Hypercholesterolemia.  Continue Vytorin 10/10.  Follow up lipid and      liver profile with Dr. Lorriane Shire.  3. Diabetes, improved, controlled with weight loss.  Continue current      diet.  Continue Januvia and metformin.  Hemoglobin A1c  quarterly.  4. Lower extremity edema, improved, dependent, much better with weight      loss.  Continue HCTZ b.i.d., elevate legs at the end of the day.  5. Hypertension, currently well controlled.  Continue Altace 10 mg a      day, particularly in part due to its protein-sparing effect with      her diabetes.  She will be seen in followup in 6 months' time.     [name]. [name], MD, [facility]  Electronically Signed    PCN/MedQ  DD: 10/31/2007  DT: 11/01/2007  Job #: [id number]

## 2010-09-19 NOTE — Assessment & Plan Note (Signed)
[place] CARDIOLOGY OFFICE NOTE   [name], [name]                      MRN:          [id number]  DATE:07/29/2008                            DOB:          12-16-1950    CARDIOLOGIST:  Previously, Dr. Johnsie May in the [facility].   PRIMARY CARE PHYSICIAN:  Amanda Curet, MD   REASON FOR VISIT:  Routine followup.   HISTORY OF PRESENT ILLNESS:  [name] is a 60 year old female patient  with a history of coronary disease status post prior inferior myocardial  infarction in 2001 treated with PCI of the RCA who subsequently  underwent cardiac catheterization in 2007, that demonstrated occlusive  in-stent restenosis of the RCA.  She has been treated medically.  She  last underwent cardiac catheterization by Dr. Johnsie May in July 2008.  At  that time, she had stable anatomy with chronically occluded RCA, left to  right collaterals, 50% mid and 20-30% distal LAD stenosis.  Her last  stress test was in July 2008, that demonstrated inferior wall infarct  with a suggestion of apical ischemia.  Her EF was 58%.  She returns  today for routine followup.  Since last being seen, she has noted some  episodes of chest discomfort.  She describes left-sided tightness when  she exerted herself outside in the cooler temperatures in the fall.  This was with walking.  She did not have to stop and her symptoms  subsided while she continued to walk.  She did not take any  nitroglycerin.  She did not have any arm or jaw symptoms.  She has had  some slight shortness of breath with exertion, but this seems to be  stable without significant changes.  She denies any symptoms at rest.  She does have some back pain from time to time that concerns her.  She  does take nitroglycerin for this.  However, her symptoms subside with  changes in positioning.  She denies orthopnea, PND, or pedal edema.  She  denies syncope.  She has been  walking on the treadmill over the winter  without any exertional chest symptoms.  She has been walking outside for  the last couple of weeks without any exertional chest symptoms.   CURRENT MEDICATIONS:  Vytorin 10 mg daily, Cosopt eye drops b.i.d.,  Altace 10 mg daily, Fish oil 1200 mg b.i.d., Imdur 30 mg daily,  Metoprolol 100 mg daily, Januvia 100 mg daily,  Metformin 500 mg b.i.d., Hydrochlorothiazide 25 mg daily, Aspirin 81 mg  daily, Albuterol p.r.n., Nitroglycerin p.r.n.   SOCIAL HISTORY:  She continues to smoke cigarettes.   REVIEW OF SYSTEMS:  She has occasional left hip pain that limits her  walking up and down steps.   PHYSICAL EXAMINATION:  GENERAL:  She is a well-nourished well-developed  female in no acute distress.  VITAL SIGNS:  Blood pressure is 120/80, pulse 76, weight 164 pounds.  HEENT:  Normal.  NECK:  Without JVD.  CARDIAC:  Normal S1 and S2.  Regular rate and rhythm.  No murmur.  LUNGS:  Clear to auscultation bilaterally.  No wheezing, no  rhonchi, no  rales.  ABDOMEN:  Soft, nontender.  EXTREMITIES:  Without edema.  NEUROLOGIC:  She is alert and oriented x3.  Cranial nerves II-XII  grossly intact.  VASCULAR:  No carotid bruits noted bilaterally.  Distal pulses are  diminished bilaterally.  SKIN:  Warm and dry.   Electrocardiogram demonstrates normal sinus rhythm with a heart rate of  75, inferior Q-waves, poor R-wave progression, interventricular  conduction delay, no acute changes.   ASSESSMENT AND PLAN:  1. Chest discomfort in a 60 year old female patient with a history of      coronary artery disease status post prior inferior myocardial      infarction, treated with the right coronary artery stenting, and      subsequently cardiac catheterization demonstrating chronically      occluded right coronary artery with left to right collaterals, and      mild-to-moderate nonobstructive disease in the LAD with overall      preserved left ventricular  function.  She certainly has anatomy to      explain chronic exertional angina.  Overall, her symptoms are mild.      I have reassured her about her back pain today.  This seems to be      musculoskeletal.  I have recommended increasing her Imdur from 30-      60 mg a day.  She will follow up in several months.  I have asked      to return sooner if she notes an escalation of her symptoms.  2. Dyslipidemia.  This is followed by Dr. Lorriane May.  We will request      recent labs.  Her goal LDL is less than or equal to 70.  3. Diabetes mellitus.  She tells me that she is having problems      controlling her sugars.  I have recommended she look into the [employer] to help with food choices and controlling her sugars.      She will continue to follow up with Dr. Lorriane May for medical      management of her diabetes mellitus.  4. Hypertension.  This is well controlled.  5. Tobacco abuse.  I have recommended she discontinue smoking.   DISPOSITION:  The patient will be brought back in followup in the next 3  months with me or Dr. Johnsie May or sooner p.r.n.      Amanda Dopp, PA-C  Electronically Signed      [name]. Amanda Haw, MD, [facility]  Electronically Signed   SW/MedQ  DD: 07/29/2008  DT: 07/30/2008  Job #: [id number]   cc:   Amanda Lightning, MD

## 2010-09-22 NOTE — Cardiovascular Report (Signed)
NAMEMELISHA, [name]               ACCOUNT NO.:  [id number]   MEDICAL RECORD NO.:  [id number]          PATIENT TYPE:  INP   LOCATION:  2033                         FACILITY:  [facility]   PHYSICIAN:  Ethelle Lyon, M.D. LHCDATE OF BIRTH:  June 21, 1950   DATE OF PROCEDURE:  10/08/2005  DATE OF DISCHARGE:                              CARDIAC CATHETERIZATION   PROCEDURE:  Left heart catheterization, left ventriculography, coronary  angiography.   INDICATIONS:  [name] is a 60 year old woman with prior coronary disease  for which she is status post placement of two overlapping bare metal stents  in the right coronary artery over an area of 60 mm back in 2001.  She now  presents with at least several months of exertional chest discomfort and  then an episode of chest discomfort occurring at rest on Saturday which  prompted admission to the hospital.  She ruled out for myocardial infarction  by serial enzymes and electrocardiograms.  Electrocardiogram showed possible  old inferior infarction without acute change.  She was referred for  diagnostic angiography.   PROCEDURAL TECHNIQUE:  Informed consent was obtained.  Under 1% lidocaine  local anesthesia, a 5-French sheath was placed in the right common femoral  artery using the modified Seldinger technique.  Diagnostic angiography and  ventriculography were performed using JL-4, JR-4, and pigtail catheters.  The patient tolerated the procedure well, was transferred to the holding  room in stable condition.  Sheath will be removed there.   COMPLICATIONS:  None.   FINDINGS:  1.  LV 155/19/26.  EF 50% with inferior akinesis sparing the apex.  2.  No aortic stenosis or mitral regurgitation.  3.  Left main:  Angiographically normal.  4.  LAD:  The LAD is a large vessel reaching the apex of the heart and      giving rise to three diagonals.  The first diagonal is a very large      vessel and supplies essentially the entirety of the lateral  wall in a      fashion much like a large obtuse marginal.  The mid LAD has a 60%      stenosis spanning the region between the second and third diagonals.  5.  Circumflex:  Very small vessel which is angiographically normal.  6.  RCA:  Moderate-sized dominant vessel.  There is a previously placed      stent throughout the mid vessel.  The beginning of the stents has an      area of approximately 15 mm of total occlusion.  The vessel then      reconstitutes via collaterals.  The remainder of the stented segment has      90% in-stent restenosis throughout its length.  The distal vessel      receives collaterals from both the right and the left which are fairly      modest.   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  She also has a moderate  stenosis of the mid left anterior descending coronary artery.  The inferior  wall appears infarcted.  She may well have some peri-infarct ischemia to  account for her angina.  We will attempt medical therapy for this.  We will  continue her beta blocker.  We will add long-acting nitrate and add  hydrochlorothiazide with the goal of improving her blood pressure control.      Ethelle Lyon, M.D. [facility]  Electronically Signed     WED/MEDQ  D:  10/08/2005  T:  10/09/2005  Job:  [id number]   cc:   Tesfaye D. Legrand Rams, MD  Fax: [phone]   Scarlett Presto, M.D.  Fax: [phone]

## 2010-09-22 NOTE — Op Note (Signed)
[name], [name]               ACCOUNT NO.:  [id number]   MEDICAL RECORD NO.:  [id number]          PATIENT TYPE:  AMB   LOCATION:  [place]                          FACILITY:  [facility]   PHYSICIAN:  Youlanda Mighty. Sypher, M.D. DATE OF BIRTH:  [birth year]   DATE OF PROCEDURE:  07/30/2006  DATE OF DISCHARGE:                               OPERATIVE REPORT   PREOPERATIVE DIAGNOSIS:  Chronic pain right shoulder with clinical  examination and magnetic resonance imaging suggesting a full-thickness  rotator cuff tear with chronic subacromial impingement, mild  acromioclavicular arthropathy and possible adhesive capsulitis.   POSTOPERATIVE DIAGNOSIS:  Confirmed adhesive capsulitis right shoulder  with stage III subacromial impingement, mild acromioclavicular  arthropathy, labral degeneration and a full-thickness rotator cuff tear  involving the conjoint tendon.   OPERATION:  1. Examination of right shoulder under anesthesia.  2. Arthroscopic evaluation glenohumeral joint followed by arthroscopic      debridement of labrum and adhesive capsulitis tissues.  3. Subacromial decompression with bursectomy, coracoacromial ligament      relaxation and acromioplasty.  4. Open repair of a degenerative retracted tear of the conjoint      tendon.   OPERATING SURGEON:  Theodis Sato, M.D.   ASSISTANT:  Herbie Baltimore Dasnoit PA-C.   ANESTHESIA:  General endotracheal supplemented by right interscalene  block, supervising anesthesiologist is Dr. Maryjean Morn.   INDICATION:  [name] is a 60 year old woman referred by Dr. Rennis May and Dr. Legrand Rams of [facility] for evaluation and management of a  painful right shoulder.  [name] had a number of orthopedic concerns  after a fall.  She has a complex previous medical history with a history  of chronic spinal pain, type 2 diabetes, hypertension and possible  glaucoma.  She has a remote history of fasciitis involving a leg that  required skin grafting.   She was  referred to see Dr. Patrice Paradise for evaluation of spinal complaints.  During her evaluation by Dr. Patrice Paradise, she noted right shoulder pain.  Plain x-rays and MRI obtained by Dr. Patrice Paradise confirmed the presence of a  degenerative right rotator cuff tear.  Dr. Patrice Paradise referred her for  evaluation and management of her shoulder.   Preoperatively she was seen by Dr. Verl Blalock, her attending cardiologist and  was cleared for anesthesia.  She now presents for evaluation and  management of her chronic right shoulder predicament.   After informed consent, she is brought to the operating room at this  time.   Preoperatively she was provided 1 gram of Ancef as an IV prophylactic  antibiotic.  In view of her history of prior necrotizing fasciitis, I  will also provide vancomycin in the perioperative period to be certain  that she is not a MRSA carrier.   PROCEDURE:  Caroly Pagel is brought to the operating room and placed in  the supine position on the operating table.   Following an anesthesia consult by Dr. Maryjean Morn, a right interscalene block  was placed for perioperative analgesia.  [name] was transferred to  room six, placed in supine position on the operating table and under Dr.  Cruz direct supervision general endotracheal anesthesia induced.   [name] was carefully positioned in the beach-chair position with  __________ torso and head holders __________  arthroscopy.  The  procedure commenced with examination of the right shoulder under  anesthesia.  She was noted have significant adhesive capsulitis with  inability to extend the shoulder to neutral.  She can externally rotate  at 90 degrees abduction only 70 degrees and abduct approximately 160  degrees.  With gentle manipulation her abduction arc was increased to  175 degrees, extension increased to neutral, external rotation to 90  degrees and internal rotation 70 degrees.   The right arm and forequarter were prepped with DuraPrep and draped  with  impervious arthroscopy drapes.  The shoulder was then instrumented  through a standard posterior viewing portal with a arthroscope using a  blunt trocar.  Diagnostic arthroscopy confirmed adhesive capsulitis,  granulation tissue, a small full-thickness rotator cuff tear involving  the conjoint tendon and significant labral degeneration.   An anterior portal was created under direct vision followed by  debridement of the labrum, adhesive capsulitis tissues and deep surface  rotator cuff tear tissues.   After photographic documentation of the anatomy with a digital camera,  the scope was removed from the glenohumeral joint and placed in the  subacromial space.  Subacromial space was obliterated by florid  bursitis.  This was cleared with a suction shaver followed by release of  the coracoacromial ligament.  The acromion was leveled to a type 1  morphology with the suction bur.  The [facility] joint capsule was not violated  and the distal clavicle not particularly prominent.   The bursal side of the cuff was carefully inspected.  There was a full-  thickness tear involving the conjoint tendon measuring 2.5 cm from  anterior to posterior dimension and the full-thickness of the cuff.  This was debrided with the suction shaver and the cortical bone of the  greater tuberosity debrided to a bleeding edge.   We contemplated possible arthroscopic repair.  However, due to the  significant mesomorphic characteristics of Ms. Mangual, I elected to  proceed with an open incision to facilitate proper suture placement.   Following removal of the arthroscopic equipment, we fashioned a 4 cm  scission and proceeded through the subcutaneous tissue to the deltoid  which was split over a distance of 2.5 cm.  The cuff tear was  immediately visualized.  A power bur was used to decorticate the greater  tuberosity followed by placement of a McLaughlin through bone mattress  suture.   The mattress suture was  placed through bone tunnels and tensioned with  the arm in abduction.  This anatomically repaired the conjoint tendon  tear.   The margin of the acromion was feathered with a rasp creating a very  smooth margin.  The deltoid split was then repaired with a corner suture  of #2 FiberWire repaired to intact fascia of the deltoid anteriorly.  The deltoid split laterally was repaired with simple suture of 0 Vicryl.   Subcutaneous tissues were gathered with multiple interrupted sutures of  0 Vicryl followed by repair of the skin with intradermal 3-0 Prolene and  Steri-Strips.   Ms. Gleed was placed in a compressive dressing and sling.  She was  awakened from general anesthesia and transferred to recovery with stable  signs.   Given her history of MRSA infection in the past, she will be provided  prophylactic vancomycin as well as IV Ancef  as well as p.o. and IV  Dilaudid as a postoperative analgesic.   There were no apparent complications.      Youlanda Mighty Sypher, M.D.  Electronically Signed     RVS/MEDQ  D:  07/30/2006  T:  07/30/2006  Job:  [id number]   cc:   Rennis May, M.D.  Dr. Legrand Rams

## 2010-09-22 NOTE — Op Note (Signed)
NAME:  [name], [name]                         ACCOUNT NO.:  [id number]   MEDICAL RECORD NO.:  [id number]                   PATIENT TYPE:  AMB   LOCATION:  DAY                                  FACILITY:  [facility]   PHYSICIAN:  R. Garfield Cornea, M.D.              DATE OF BIRTH:  Dec 03, 1950   DATE OF PROCEDURE:  09/10/2003  DATE OF DISCHARGE:                                 OPERATIVE REPORT   PROCEDURE:  Colonoscopy with biopsy.   INDICATIONS FOR PROCEDURE:  The patient is a 60 year old lady sent over the  courtesy of Dr. Legrand Rams for colorectal cancer screening.  She is devoid of any  lower GI tract symptoms, aside from occasional constipation and laxative  use.   She tells me that she underwent a screening colonoscopy at age 76 at [facility] in [place], and no abnormalities were found.  There is no  family history of colorectal neoplasia.  Colonoscopy is now being done as a  screening maneuver.  This approach has been discussed with the patient at  length.  The potential risks, benefits, and alternatives have been reviewed  and questions answered.  She is agreeable.  Please see my handwritten H&P  for more information.   PROCEDURE:  O2 saturation, blood pressure, pulses, and respirations were  monitored throughout the entirety of the procedure.  Conscious sedation was  with Versed 4 mg IV, Demerol 100 mg IV in divided doses.  The instrument  used was the Olympus video chip system.   FINDINGS:  Digital rectal examination revealed no abnormalities.   ENDOSCOPIC FINDINGS:  The prep was good.   Rectum:  Examination of the rectal mucosa including retroflex view of the  anal verge revealed no abnormalities.   Colon:  The colonic mucosa was surveyed from the rectosigmoid junction  through the left, transverse, right colon to the area of the appendiceal  orifice, ileocecal valve, and cecum.  These structures were well-seen and  photographed for the record.  From this level, the  scope was slowly  withdrawn.  All previously mentioned mucosal surfaces were again seen.  The  patient was noted to have (1) melanosis coli, (2) a 5-mm polyp in the mid-  descending colon at 45 cm.  It was cold biopsy/removed.  Otherwise, the  colonic mucosa appeared normal.  The patient tolerated the procedure well  and was reactive in endoscopy.   IMPRESSION:  1. Normal rectum.  2. Melanosis coli.  3. Diminutive polyp in the mid-descending colon at 45 cm cold     biopsy/removed.  The remainder of the colonic mucosa appeared normal.   RECOMMENDATIONS:  1. Follow up on pathology.  2. Further recommendations to follow.      ___________________________________________  Bridgette Habermann, M.D.   RMR/MEDQ  D:  09/10/2003  T:  09/10/2003  Job:  [id number]   cc:   Tesfaye D. Legrand Rams, M.D.  [address]  [place]  [place] [address]  Fax: [phone]

## 2010-09-22 NOTE — Assessment & Plan Note (Signed)
[facility] OFFICE NOTE   Amanda, May                      MRN:          [id number]  DATE:01/11/2006                            DOB:          1950-08-19    [name] comes in today for follow up of her coronary artery disease. She  was admitted for chest pain and ruled out for myocardial infarction in June.  Cardiac catheterization showed an occluded proximal stent in the right  coronary artery with left-to-left, and right-to-right collaterals.  She had  a 60% moderate stenosis in the mid LAD,  EF 50% with inferior akinesia.  Her  circumflex had no obstructive disease.   She is having no angina.   MEDICATIONS:  1. Vytorin 10/10 daily.  2. Eye drops.  3. Altace 10 mg daily.  4. Metformin 1000 b.i.d.  5. Lantus 17 units q.h.s.  6. Fish oil 1200 mg b.i.d.  7. Actos 45 mg daily.  8. Imdur 30 mg daily.  9. Hydrochlorothiazide 12.5 daily.  10.Metoprolol 50 b.i.d.   PHYSICAL EXAMINATION:  VITAL SIGNS:  Her blood pressure is 98/62, pulse 70  and regular. Her weight is 198, up 12.  NECK:  Shows no JVD. Carotids are full without bruits, there is no  thyromegaly. Trachea is midline.  LUNGS:  Clear.  HEART:  Reveals a regular rate and rhythm. There is a normal S2.  ABDOMEN:  Soft with good bowel sounds.  EXTREMITIES:  No clubbing, cyanosis, 1+ edema at the ankles. Pulses are  intact.   ASSESSMENT AND PLAN:  [name] is doing well.  I am concerned about her  weight gain some of which may be due to Actos which seems to be added  recently. We talked about the Avandia controversy and it may spill over into  Actos.  She will discuss this with Dr. Legrand Rams.  If her blood sugars can be  controlled with metformin, I would probably opt for that at this point in  time.   I will plan on seeing her back in June of 2008.                                   Thomas C. Verl Blalock, MD, [facility]   TCW/MedQ  DD:   01/11/2006  DT:  01/11/2006  Job #:  [id number]   cc:   Tesfaye D. Legrand Rams, MD

## 2010-09-22 NOTE — Discharge Summary (Signed)
[place]. [facility]  Patient:    [name], [name]                      MRN: [id number] Adm. Date:  [id number] Disc. Date: 02/14/00 Attending:  Christy Sartorius Dictator:   Ermalinda Barrios, P.A.C.                  Referring Physician Discharge [place]  ADMITTING DIAGNOSIS:  Acute myocardial infarction.  DISCHARGE DIAGNOSES: 1. Status post diaphragmatic myocardial infarction, treated with stenting of    the right coronary artery with residual 50-60% left anterior descending    artery, ejection fraction 40-45%. 2. Diabetes mellitus. 3. Smoker. 4. Status post hysterectomy.  BRIEF HISTORY AND PHYSICAL:  Please see H&P for details.  This is a 60 year old black female patient with diabetes for the past five years, but has not been followed by a doctor since moving here two years ago to take care of her father.  She presented to [facility] approximately midnight complaining of substernal chest pain that had gone on and off all day long, since 1 p.m. on October 6.  She had shortness of breath, diaphoresis, and nausea and vomiting.  She had partial relief with nitroglycerin, and EKG showed ST elevation inferiorly.  She was transferred and underwent cardiac catheterization by Dr. Lyndel Safe, which revealed left main luminal irregularities, 50% proximal and distal LAD, small distal circumflex OM, ramus intermedius was a large vessel with two OMs, RCA was dominant with a mid 80% marginal and then a total, with severe throughout.  She underwent stenting of the long RCA lesion and ejection fraction was 40-45% with severe inferior hypokinesis.  The patient did have a small hematoma develop initially after catheterization, but that resolved quickly.  She was followed by a diabetes coordinator.  The patient drinks regular Coke, tea, and smokes regularly, and has not been taking care of her diabetes.  She was eventually transferred to telemetry.  She did have mild chest pain  on October 10, but EKG showed no change and she had no change with ambulation. Chest pain resolved and Dr. Lyndel Safe felt that she was stable for discharge.  LABORATORY VALUES:  Hemoglobin on admission 10.6, hematocrit 29.  Follow-up hemoglobin 9.8, hematocrit 27.  White count 1.7.  Sodium 136, potassium 3.6, chloride 105, CO2 26, BUN 5, creatinine 0.6.  Hemoglobin A1c was 13.4.  CK-MBs as follows:  First was 790/159; second was 1612/260; third was 1008/139; fourth was 365/21.  Troponins as follows:  5.77 and 29.  Cholesterol was 162, triglycerides 169, HDL 19, LDL 109.  Initial urinalysis showed large hemoglobin, amber color, rare bacteria.  Blood type O positive.  Chest x-ray:  Moderate bibasilar atelectasis, no evidence of failure or pneumonia.  EKG:  Normal sinus rhythm with inferior ST elevation.  Follow-up EKG:  Small inferior Qs.  DISPOSITION:  Patient is discharged home in stable condition on the following medications.  DISCHARGE MEDICATIONS: 1. Coated aspirin q.d. 2. Plavix 75 mg q.d. for four weeks. 3. Lopressor 50 mg one-and-a-half b.i.d. 4. Glucotrol XL 10 mg q.d. 5. Lipitor 10 mg q.d. 6. Nitroglycerin p.r.n.  ACTIVITY:  She is to do no heavy lifting or strenuous activity, or drive until she is seen by the doctor.  DIET:  She is to follow a low fat diabetic diet.  SPECIAL INSTRUCTIONS:  She is to stop smoking.  She is to contact [facility] diabetes education program and she has been given  that number.  She is to find a primary medical doctor and she is interested in someone in [place].  FOLLOW-UP:  She will see the P.A. back with Dr. Verl Blalock on February 27, 2000 at 11 a.m.  Because she has no insurance and financial burdens, case managers are trying to get her medicines here for her before discharge.  She should have a follow-up lipid profile and LFTs in approximately 4-6 weeks and a follow-up Cardiolite after that. DD:  02/14/00 TD:  02/14/00 Job:  [id number]

## 2010-09-22 NOTE — Letter (Signed)
May 28, 2006    Youlanda Mighty. Sypher, M.D.  [facility] [address]  [place], [place] [address]   RE:  EDELL, DELEON  MRN:  [id number]  /  DOB:  [birth year]   Dear Leonor Liv:   I received your letter May 22, 2006.  Misk Dorothy has been seen by  me in the office in September 2007.  She had had a catheterization in  June 2007.   She is stable from a cardiovascular standpoint.  I think she is low  operative risk from our standpoint.  I will go ahead and clear her for  surgery.   Thank you for your consideration and inquiry.  I sincerely hope she will  do well.    Sincerely,      Marijo Conception. Verl Blalock, MD, [facility]  Electronically Signed    TCW/MedQ  DD: 05/28/2006  DT: 05/28/2006  Job #: [id number]

## 2010-09-22 NOTE — Discharge Summary (Signed)
[name], [name]               ACCOUNT NO.:  [id number]   MEDICAL RECORD NO.:  [id number]          PATIENT TYPE:  INP   LOCATION:  2033                         FACILITY:  [facility]   PHYSICIAN:  Ethelle Lyon, M.D. LHCDATE OF BIRTH:  Jul 07, 1950   DATE OF ADMISSION:  10/07/2005  DATE OF DISCHARGE:  10/09/2005                                 DISCHARGE SUMMARY   PRIMARY CARDIOLOGIST:  Scarlett Presto, M.D.   PRINCIPAL DIAGNOSES:  1.  Acute coronary syndrome.      1.  Normal serial cardiac markers.  2.  Known coronary artery disease.      1.  100% InStent restenosis of proximal right coronary artery stent by          cardiac catheterization this admission.      2.  Residual non-critical coronary artery disease.      3.  Mild left ventricular dysfunction (EF 50%) with inferior akinesis.  3.  Probable peripheral vascular disease.      1.  Bilateral femoral bruits.   SECONDARY DIAGNOSES:  1.  Type 2 diabetes mellitus.  2.  Hypertension.  3.  Hyperlipidemia.   PROCEDURES:  Cardiac catheterization by Dr. Sabino Snipes on October 08, 2005.   REASON FOR ADMISSION:  [name] is a 60 year old female with known  coronary artery disease, status post prior stenting of the right mid  coronary artery in 2001, who presented with signs and symptoms worrisome for  unstable angina pectoris.   HOSPITAL COURSE:  The patient ruled out for myocardial infarction with all  serial cardiac markers within normal limits.   Cardiac catheterization performed by Dr. Sabino Snipes (see report for full  details) notable for 100% occlusion of the proximal RCA stent with left-  right and right-right distal collateralization.  The left coronary system  notable for normal CFX and non-obstructive LAD disease.  Left ventricular  function showed mild left ventricular dysfunction (EF 50%) with inferior  kinesis.   Dr. Albertine Patricia recommended continued medical therapy and added Imdur and  hydrochlorothiazide.   At  the time of discharge, Toprol was up-titrated to 150 daily for better  control of basal heart rate.  The patient was kept for overnight  observation, and cleared for discharge the following morning in  hemodynamically stable condition.  The right groin incision site was stable  with no evidence of hematoma; however, there was the finding of bilateral  femoral bruits (left greater than right) suggestive of underlying peripheral  vascular disease.  The distal pulses were brisk bilaterally.   The recommendation is to consider outpatient lower extremity arterial  Dopplers to exclude significant peripheral vascular disease.   OUTSTANDING LABORATORIES:  Serial cardiac markers normal.  Liver profile -  total cholesterol 91, triglycerides 124, HDL 26 and LDL 40.  Hemoglobin A1C  of 8.1.  Electrolytes and renal function normal.  Normal liver enzymes with  decreased albumin at 2.9.  Glucose 161 on admission.  WBC of 14.6 on  admission, subsequently normalized.  Normal hemoglobin/hematocrit and  platelets on admission.   ADMISSION CHEST X-RAY:  No acute changes.  DISCHARGE MEDICATIONS:  1.  Imdur 30 mg daily (new).  2.  Hydrochlorothiazide 12.5 mg daily (new).  3.  Coated aspirin 325 mg daily.  4.  Vytorin 10/10 mg daily.  5.  Altace 10 mg daily.  6.  Actos 45 mg daily.  7.  Lantus 17 units q.h.s.  8.  Metformin 1000 mg b.i.d.  9.  Toprol-XL 150 mg daily.  10. Fish oil 1200 mg b.i.d.  11. Cosopt eye solution, one drop OU b.i.d.  12. Nitrostat 0.4 mg as directed.   INSTRUCTIONS:  The patient is to refer to cardiac catheterization discharge  sheet for full instructions.   FOLLOWUP:  1.  Scarlett Presto, M.D. on October 23, 2005 at 1 p.m.  2.  The patient instructed to arrange follow up with Dr. Legrand Rams in 2 weeks.   DISPOSITION:  Stable.   DISCHARGE DURATION:  35 minutes.      Mannie Stabile, P.A. [facility]      Ethelle Lyon, M.D. [facility]  Electronically Signed    GS/MEDQ  D:   10/09/2005  T:  10/10/2005  Job:  [id number]   cc:   Tesfaye D. Legrand Rams, MD  Fax: [phone]

## 2010-09-22 NOTE — Cardiovascular Report (Signed)
[facility]  Patient:    [name], [name]                      MRN: [id number] Proc. Date: 02/11/00 Adm. Date:  [id number] Disc. Date: [id number] Attending:  Christy Sartorius CC:         Christy Sartorius, M.D. [facility]   Cardiac Catheterization  PROCEDURES PERFORMED: 1.  Left heart catheterization. 2.  Left ventriculogram. 3.  Selective coronary angiography. 4.  Percutaneous transluminal coronary angioplasty and stenting of the mid     right coronary artery. 5.  Perclose of right femoral artery.  DIAGNOSES: 1.  Severe single-vessel coronary artery disease. 2.  Mild left ventricular systolic dysfunction. 3.  Acute inferior wall myocardial infarction.  HISTORY:  Ms. Degraff is a 60 year old black female with a history of diabetes mellitus and tobacco use, who presents with substernal chest discomfort.  The patient has had waxing and waning symptoms x 24 hours and finally presented to [facility] where she had nonspecific ECG changes and slight elevation of her troponin enzymes.  The patient was transferred to [facility]. She initially was pain free, however, she has had recurrence of chest discomfort.  ECGs showed nondiagnostic ST elevation in the inferior leads and repeat cardiac markers were elevated, consistent with myocardial infarction. Due to recurrence of pain, the patient was brought to the catheterization laboratory for urgent left heart catheterization.  TECHNIQUE:  After informed consent was obtained, the patient was brought to the catheterization laboratory where both groins were sterilely prepped and draped.  One percent Lidocaine was used to infiltrate the right groin and a 7-French sheath was placed in the right femoral artery using the modified Seldinger technique.  Six [place] JL-4 and JR-4 catheters were then used to engage the left and right coronary arteries and selective angiography was performed in various projections  using manual injections of contrast.  A 6-French pigtail catheter was then advanced into the left ventricle and a left ventriculogram was performed using power injections of contrast.  FINDINGS:  The initial findings are as follows:  1.  Left main trunk:  Large caliber vessel with luminal irregularities.  2.  Left anterior descending artery:  This is a medium caliber vessel which     provides several small diagonal branches as it extended to the apex.     There is moderate diffuse disease throughout the LAD with narrowings of     50% in the proximal and distal segment.  3.  Left circumflex artery:  This is a trivial vessel that provides a small     distal marginal branch.  There is a large ramus intermedius that     essentially provides the circumflex territory and two marginal branches     are noted to arise from this branch.  Mild irregularities are seen in the     vessel.  4.  Right coronary artery:  This is a dominant vessel and is a medium caliber     vessel.  It provides the posterior descending artery and several     posteroventricular branches in its terminal segment.  The right coronary     artery has mild diffuse disease in the proximal of 30%.  The mid section     is severely and diffusely diseased with narrowings of 80% at the takeoff     of the RV marginal branch.  This is followed by a 100% occlusion.  The  distal RCA has moderate disease of 70% after the occlusion, but     terminating prior to the takeoff of the posterior descending artery.     Distal to the posterior descending artery, the right coronary artery     has moderate disease of 50% to 60%.  The branch vessels have mild     irregularities.  5.  Left ventriculogram:  Normal end-systolic and end-diastolic dimensions.     Overall left ventricular function is mildly impaired.  Ejection fraction     is approximately 40% to 45%.  There is severe hypokinesis of the inferior     wall.  No mitral regurgitation is  noted.  LV pressure is 110/10, aortic     pressure 110/70.  LV EDP is 17.  INTERVENTIONAL PROCEDURE:  With these findings, we elected to proceed with percutaneous intervention of the right coronary artery.  The patient had previously been on aspirin and heparin.  She was given Plavix 300 mg orally and started on a ReoPro bolus and drip on a weight-adjusted basis.  A 7-French JR-4 guide catheter with sideholes was advanced to the origin of the right coronary artery and a 0.014 inch high-torque floppy wire was advanced into the distal segment of the vessel.  A 2.5 mm x 20 mm CrossSail balloon was introduced and used to dilate the distal lesion at 8 atm x 60 seconds.  This balloon was also used to dilate the proximal lesion at 8 atm x 60 seconds. Repeat angiography showed recanalization of the vessel with improvement in blood flow, however, there was severe diffuse disease throughout the mid section of the right coronary artery and evidence of a small dissection at the distal lesion.  A 3.0 mm x 20 mm CrossSail balloon was introduced and used to further dilate the mid RCA.  A total of five inflations were performed at up to 14 atm x 30 seconds.  Again, angiography showed improvement in vessel lumen.  The proximal lesion was extremely fibrotic and difficult to dilate. There was moderate waste at the distal lesion as well.  We elected to proceed with stenting of the mid right coronary artery.  A 3.0 mm x 28 mm Penta stent was then carefully positioned at the distal lesion in the mid and distal RCA and deployed at 12 atm x 60 seconds.  A 3.0 mm x 33 mm Penta stent was then positioned in the mid RCA, just proximal to the previously placed stent and deployed at 12 atm x 60 seconds.  Repeat angiography showed excellent result with significant improvement in the vessel lumen and flow and moderate residual narrowings at the two fibrotic stenoses.  A 3.25 mm x 20 mm Quantum monorail balloon was  introduced and used to postdilate the stents.  The distal stent was dilated at 14 atm x 30 seconds in its distal segment and at  16 atm x 60 seconds in the proximal segment.  The junction of the two stents was dilated at 16 atm x 30 seconds.  The mid section of the proximally placed stent in the area of fibrotic plaque was dilated to 20 atm x 60 seconds and proximal segment of the stent was dilated at 18 atm x 60 seconds.  Repeat angiography showed significant improvement in the vessel lumen, no residual vessel damage, and mild residual narrowing of 30% at the two stenotic sites. Final angiography was performed in various projections.  There was no residual vessel damage that was uncovered and only mild  residual stenosis.  TIMI flow improved from grade 1 to grade 3.  A repeat angiogram was performed at the ostium of the right coronary artery to further access this lesion as the catheter deep-throated the vessel due to the angle of its takeoff and this showed only mild stenosis of 30%.  The guide catheter was then removed.  The Perclose suture closure device was then deployed to the right femoral artery until adequate hemostasis was achieved.  The patient tolerated the procedure well and was then transferred to the floor in stable condition.  FINAL RESULTS:  Successful percutaneous transluminal coronary angioplasty and stenting of the mid right coronary artery with reduction of sequential 80% and 100% narrowings to less than 20% with placement of a 3.0 mm x 33 mm Penta stent followed by a 3.0 mm x 28 mm Penta stent. DD:  02/11/00 TD:  02/12/00 Job: [id number]

## 2010-09-22 NOTE — H&P (Signed)
[name], [name]               ACCOUNT NO.:  [id number]   MEDICAL RECORD NO.:  [id number]          PATIENT TYPE:  INP   LOCATION:  2923                         FACILITY:  [facility]   PHYSICIAN:  Titus Mould, MD      DATE OF BIRTH:  07-Apr-1951   DATE OF ADMISSION:  10/07/2005  DATE OF DISCHARGE:                                HISTORY & PHYSICAL   CHIEF COMPLAINT:  Chest pain.   HISTORY OF PRESENT ILLNESS:  [name] is a 60 year old female with a  history of coronary artery disease status post stent placement to the mid-  RCA in 2001, who presented to an outside emergency department with  complaints of chest pain.  While lying in bed tonight, the patient had onset  of substernal mid chest discomfort/pressure that radiated to her left upper  extremity.  The patient took two sublingual nitroglycerin at home, with no  relief of her pain.  At worst, the chest pain was a 7/10.  The patient  presented to an outside emergency department, and her pain was improved with  morphine and nitroglycerin.  The patient had no associated shortness of  breath, no nausea or diaphoresis.  At the outside hospital, the patient was  placed on a heparin and nitroglycerin drip.  Currently, the patient  describes a 1/10 chest pressure.  The patient has had no episodes of  syncope, no presyncope, no palpitations.   PAST MEDICAL HISTORY:  1.  Coronary artery disease status post MI, status post Penta 3 x 33 stent      placement to the mid-RCA in 2001, status post 3 x 28 Penta to the mid-      RCA in 2001.  2.  Diabetes mellitus, type 2.  3.  History of tobacco use.  4.  Hypertension.  5.  Hyperlipidemia.  6.  Glaucoma.   MEDICATIONS:  1.  Aspirin 81 mg p.o. daily.  2.  Toprol-XL 100 mg p.o. daily.  3.  Metformin 1000 mg p.o. b.i.d.  4.  Altace 10 mg p.o. daily.  5.  Actos 45 mg p.o. daily.  6.  Lantus 15 to 20 units subcu nightly.  7.  Vytorin 10/10 one tablet p.o. daily.   ALLERGIES:  NO KNOWN  DRUG ALLERGIES.   SOCIAL HISTORY:  The patient is a former smoker, is not married.   FAMILY HISTORY:  Father with history of coronary artery disease in his 58s.   REVIEW OF SYSTEMS:  All systems are reviewed in detail and are negative,  except as noted in the history of present illness.   PHYSICAL EXAMINATION:  VITAL SIGNS:  Heart rate in the 70s, blood pressure  in the 120s/70s.  GENERAL:  Slightly obese female, alert and oriented x3, in no apparent  distress.  HEENT:  Atraumatic, normocephalic.  Pupils are equal, round, and reactive to  light.  Extraocular movements intact.  Oropharynx clear.  NECK:  Supple.  No adenopathy, no JVD, no carotid bruits.  CHEST:  Decreased breath sounds throughout; otherwise, clear and equal  bilaterally.  CORONARY:  Regular rhythm, normal rate.  Normal S1 and S2.  No murmurs,  rubs, or gallops.  ABDOMEN:  Soft, nontender, nondistended.  Active bowel sounds.  No  hepatosplenomegaly.  EXTREMITIES:  No clubbing, cyanosis, or edema.  NEUROLOGIC:  No focal deficits.  SKIN:  No rashes.  PSYCHIATRIC:  Normal affect.   LABORATORY DATA:  From the outside hospital, white blood cell count 14.6,  hematocrit 42, platelets 162.  Sodium 141, potassium 3.9, chloride 107,  bicarb 27, BUN 13, creatinine 0.9, glucose 161.  INR 1.  Myoglobin 72.  CK-  MB 1, troponin less than 0.05.   EKG from the outside hospital showed normal sinus rhythm, possible old  inferior infarct, nonspecific ST and T wave changes.   IMPRESSION:  [name] is a 60 year old female with multiple cardiac risk  factors, known coronary artery disease status post stent placement x2 to the  right coronary artery in 2001 who presents with chest discomfort concerning  for acute coronary syndrome.   PLAN:  1.  Pulmonary:  Portable chest x-ray.  2.  Cardiovascular:  EKG, serial cardiac enzymes, telemetry bed, continue      home dose of aspirin, statin, ACE inhibitor, and beta-blocker.  Continue       the patient on a heparin and nitroglycerin drip.  Check a BNP.  3.  Fluids, electrolytes, and nutrition:  Cardiac diet, check electrolytes.  4.  GI:  Check liver function tests.  5.  Hematologic:  Check CBC, coagulation panel, guaiac all stools.  6.  Endocrine:  Check a hemoglobin A1C, place the patient on sliding scale      insulin, and continue her home dose of Lantus.      Titus Mould, MD  Electronically Signed     TRK/MEDQ  D:  10/07/2005  T:  10/07/2005  Job:  [id number]

## 2011-02-16 LAB — URINALYSIS, ROUTINE W REFLEX MICROSCOPIC
Glucose, UA: NEGATIVE
Ketones, ur: NEGATIVE
Specific Gravity, Urine: 1.015
pH: 6.5

## 2011-02-16 LAB — URINE CULTURE: Colony Count: 35000

## 2011-02-16 LAB — COMPREHENSIVE METABOLIC PANEL
AST: 22
Albumin: 3.6
Alkaline Phosphatase: 52
Chloride: 106
Creatinine, Ser: 0.88
GFR calc Af Amer: >60
Potassium: 4
Total Bilirubin: 0.5

## 2011-02-16 LAB — DIFFERENTIAL
Basophils Absolute: 0.1
Eosinophils Relative: 1
Lymphocytes Relative: 30
Monocytes Absolute: 0.7

## 2011-02-16 LAB — CBC
Platelets: 177
WBC: 9.5

## 2011-04-17 ENCOUNTER — Other Ambulatory Visit (HOSPITAL_COMMUNITY): Payer: Self-pay | Admitting: Family Medicine

## 2011-04-17 DIAGNOSIS — Z139 Encounter for screening, unspecified: Secondary | ICD-10-CM

## 2011-05-08 HISTORY — PX: ROBOTIC ASSITED PARTIAL NEPHRECTOMY: SHX6087

## 2011-07-17 ENCOUNTER — Encounter: Payer: Self-pay | Admitting: Cardiovascular Disease

## 2011-07-18 ENCOUNTER — Other Ambulatory Visit: Payer: Self-pay

## 2011-07-18 ENCOUNTER — Encounter: Payer: Self-pay | Admitting: Cardiovascular Disease

## 2011-07-18 ENCOUNTER — Ambulatory Visit (INDEPENDENT_AMBULATORY_CARE_PROVIDER_SITE_OTHER): Payer: Medicare Other | Admitting: Cardiovascular Disease

## 2011-07-18 VITALS — BP 122/71 | HR 77 | Ht 59.0 in | Wt 165.0 lb

## 2011-07-18 DIAGNOSIS — I2510 Atherosclerotic heart disease of native coronary artery without angina pectoris: Secondary | ICD-10-CM

## 2011-07-18 DIAGNOSIS — E119 Type 2 diabetes mellitus without complications: Secondary | ICD-10-CM | POA: Insufficient documentation

## 2011-07-18 MED ORDER — NITROGLYCERIN 0.4 MG SL SUBL: 0.4000 mg | SUBLINGUAL_TABLET | SUBLINGUAL | Status: DC | PRN

## 2011-07-18 NOTE — Assessment & Plan Note (Signed)
Discussed low carb diet.  Target hemoglobin A1c is 7.0 or less.  Continue current medications.

## 2011-07-18 NOTE — Assessment & Plan Note (Signed)
Stable with no angina and good activity level.  Continue medical Rx  

## 2011-07-18 NOTE — Assessment & Plan Note (Signed)
Well controlled.  Continue current medications and low sodium Dash type diet.    

## 2011-07-18 NOTE — Assessment & Plan Note (Signed)
Cholesterol is at goal.  Continue current dose of statin and diet Rx.  No myalgias or side effects.  F/U  LFT's in 6 months. No results found for this basename: [place]  Labs with [facility]

## 2011-07-18 NOTE — Patient Instructions (Signed)
**Note De-identified  Obfuscation** Your physician recommends that you continue on your current medications as directed. Please refer to the Current Medication list given to you today.  Your physician recommends that you schedule a follow-up appointment in: 1 year  

## 2011-07-18 NOTE — Progress Notes (Signed)
[name] returns today for followup. She has known coronary disease. Shehas a total right coronary artery with collaterals. She does not have critical left-sided disease. I recall doing her catheterization in July2008. She did not have a lot of insight into her coronary disease. I took pictures for her  and explained to her her anatomy. . She Denies SSCP, palpitations, or dyspnea. She has been compliant with her meds. Needs nitro refilled  She is active.  BS poorly controlled again.  On lantus now and ? A1c as high as 15.  Was juicing and had too many fruits as well as bread.  Discussed low carb diet with her  ROS: Denies fever, malais, weight loss, blurry vision, decreased visual acuity, cough, sputum, SOB, hemoptysis, pleuritic pain, palpitaitons, heartburn, abdominal pain, melena, lower extremity edema, claudication, or rash.  All other systems reviewed and negative  General: Affect appropriate Affable Overweight black female HEENT: normal Neck supple with no adenopathy JVP normal no bruits no thyromegaly Lungs clear with no wheezing and good diaphragmatic motion Heart:  S1/S2 no murmur, no rub, gallop or click PMI normal Abdomen: benighn, BS positve, no tenderness, no AAA no bruit.  No HSM or HJR Distal pulses intact with no bruits No edema Neuro non-focal Skin warm and dry No muscular weakness   Current Outpatient Prescriptions  Medication Sig Dispense Refill  . aspirin 325 MG EC tablet Take 325 mg by mouth daily.        . brimonidine-timolol (COMBIGAN) 0.2-0.5 % ophthalmic solution Place 1 drop into both eyes every 12 (twelve) hours.      Marland Kitchen ezetimibe-simvastatin (VYTORIN) 10-10 MG per tablet Take 1 tablet by mouth at bedtime.        Marland Kitchen glipiZIDE (GLUCOTROL) 5 MG 24 hr tablet Take 10 mg by mouth daily.       . hydrochlorothiazide 25 MG tablet Take 25 mg by mouth daily.        Marland Kitchen HYDROcodone-acetaminophen (NORCO) 7.5-325 MG per tablet Take 1 tablet by mouth 2 (two) times daily.      .  insulin glargine (LANTUS) 100 UNIT/ML injection Inject 12 Units into the skin as needed.      . isosorbide mononitrate (IMDUR) 30 MG 24 hr tablet Take 30 mg by mouth daily.        . metFORMIN (GLUCOPHAGE) 500 MG tablet Take 500 mg by mouth 2 (two) times daily with a meal.        . methocarbamol (ROBAXIN) 750 MG tablet Take 750 mg by mouth as needed.      . metoprolol (TOPROL-XL) 100 MG 24 hr tablet Take 100 mg by mouth daily.        . Multiple Vitamin (MULTIVITAMIN) tablet Take 1 tablet by mouth daily. MULTIPLE OTC SUPPLEMENTS      . nitroGLYCERIN (NITRODUR - DOSED IN MG/24 HR) 0.4 mg/hr Place 1 patch onto the skin daily.      . nitroGLYCERIN (NITROSTAT) 0.4 MG SL tablet Place 0.4 mg under the tongue every 5 (five) minutes as needed.      . ramipril (ALTACE) 10 MG capsule Take 10 mg by mouth daily.        Marland Kitchen senna (SENOKOT) 8.6 MG tablet Take 1 tablet by mouth as needed.        Allergies  Review of patient's allergies indicates not on file.  Electrocardiogram:  Assessment and Plan

## 2011-07-19 ENCOUNTER — Telehealth (HOSPITAL_COMMUNITY): Payer: Self-pay | Admitting: Dietician

## 2011-07-19 NOTE — Telephone Encounter (Signed)
Received referral via telephone message from Dr. Glo Herring [facility]) for dx: diabetes on 07/19/11 at 9:44 AM.

## 2011-07-20 NOTE — Telephone Encounter (Signed)
Appointment scheduled for 07/25/11 at 10:30 AM.

## 2011-07-25 ENCOUNTER — Encounter (HOSPITAL_COMMUNITY): Payer: Self-pay | Admitting: Dietician

## 2011-07-25 NOTE — Progress Notes (Signed)
Outpatient Initial Nutrition Assessment  Date:07/25/2011   Time: 10:00 AM  Referring Physician: Dr. Glo Herring [facility]) Reason for Visit: diabetes  Noted pt scheduled at 10:30 AM. Pt arrived early, so pt worked in for 10:00 AM.   Nutrition Assessment:  Ht: Height: 4\' 11"  (149.9 cm)   Wt:Weight: 163 lb (73.936 kg)   IBW: 98# %IBW: 166% UBW: 165# %UBW: 199# BMI: Body mass index is 32.92 kg/(m^2).  Goal Weight: 140# (pt desire) Weight hx: Pt reports her lowest weight was 120#, when she was 61 years old. Her highest weight was 250# in 1995, when she was first diagnosed with diabetes. She reports that she has able to lose weight and maintain loss for several years. She is very proud of her weight loss.   Estimated nutritional needs: 1495-1749 kcals daily, 59-74 grams protein daily, 1.5-1.7 L fluid daily  PMH:  Past Medical History  Diagnosis Date  . CAD (coronary artery disease)     Cath 2008 total RCA with collaterals  . HTN (hypertension), benign   . Hyperlipidemia   . Diabetes mellitus     Medications:  Current Outpatient Rx  Name Route Sig Dispense Refill  . ASPIRIN 325 MG PO TBEC Oral Take 325 mg by mouth daily.      Marland Kitchen BRIMONIDINE TARTRATE-TIMOLOL 0.2-0.5 % OP SOLN Both Eyes Place 1 drop into both eyes every 12 (twelve) hours.    Marland Kitchen EZETIMIBE-SIMVASTATIN 10-10 MG PO TABS Oral Take 1 tablet by mouth at bedtime.      Marland Kitchen GLIPIZIDE ER 5 MG PO TB24 Oral Take 10 mg by mouth daily.     Marland Kitchen HYDROCHLOROTHIAZIDE 25 MG PO TABS Oral Take 25 mg by mouth daily.      Marland Kitchen HYDROCODONE-ACETAMINOPHEN 7.5-325 MG PO TABS Oral Take 1 tablet by mouth 2 (two) times daily.    . INSULIN GLARGINE 100 UNIT/ML  SOLN Subcutaneous Inject 12 Units into the skin as needed.    . ISOSORBIDE MONONITRATE ER 30 MG PO TB24 Oral Take 30 mg by mouth daily.      Marland Kitchen METFORMIN HCL 500 MG PO TABS Oral Take 500 mg by mouth 2 (two) times daily with a meal.      . METHOCARBAMOL 750 MG PO TABS Oral Take 750 mg by mouth  as needed.    Marland Kitchen METOPROLOL SUCCINATE ER 100 MG PO TB24 Oral Take 100 mg by mouth daily.      Marland Kitchen ONE-DAILY MULTI VITAMINS PO TABS Oral Take 1 tablet by mouth daily. MULTIPLE OTC SUPPLEMENTS    . NITROGLYCERIN 0.4 MG SL SUBL Sublingual Place 1 tablet (0.4 mg total) under the tongue every 5 (five) minutes as needed. 90 tablet 1  . RAMIPRIL 10 MG PO CAPS Oral Take 10 mg by mouth daily.      . SENNOSIDES 8.6 MG PO TABS Oral Take 1 tablet by mouth as needed.      Labs: CMP     Component Value Date/Time   NA 137 06/07/2009   K 4.4 06/07/2009   CL 101 06/07/2009   CO2 21 06/07/2009   GLUCOSE 273 06/07/2009   BUN 22 06/07/2009   CREATININE 1.07 06/07/2009   CALCIUM 10.0 06/07/2009   PROT 6.7 01/03/2007 0955   ALBUMIN 3.6 01/03/2007 0955   AST 26 06/07/2009   ALT 45 06/07/2009   ALKPHOS 67 06/07/2009   BILITOT 0.5 01/03/2007 0955   GFRNONAA >60 01/03/2007 0955   GFRAA  Value: >60  The eGFR has been calculated using the MDRD equation. This calculation has not been validated in all clinical 01/03/2007 0955    Lipid Panel     Component Value Date/Time   CHOL 221 06/07/2009   TRIG 485 06/07/2009   HDL 31 06/07/2009     Lab Results  Component Value Date   HGBA1C 11.6 06/07/2009   Lab Results  Component Value Date   CREATININE 1.07 06/07/2009     Lifestyle/ social habits: [name] lives alone in [place], [place]. She has an adult daughter who lives in [place]. She moved to [place] from [place] several years ago to help care for her father, who has since passed away. 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 She reports her stress level as an 8, but elaborated very little on her causes of stress, but did mention that her daughter's father recently passed away.   Nutrition hx/habits: [name] reports she has no set eating  schedule. She reports she does not enjoy eating and would rather drink her foods. She recently stopped juicing, after having this a main part of her diet for 6-7 years. She eats out once a month, usually frequenting [place] or [place]. She reports she has never checked her blood sugars, even though she has had diabetes since 1995. She reports that she has struggled the most with her diabetes since she moved to [facility], where "good foods" are not as readily available as they are in [place]. Most recent recorded Hgb A1c was 11.6, although Dr. Kyla Balzarine most recent note stated that it has been as high as 15. She reports she was given a script for a glucometer and she is awaiting for the glucometer to arrive in the mail. She has a very extensive food pantry of frozen foods that she prepared herself and multiple shelves of canned goods and baking supplies (she brought her most recent grocery receipts and pictures of contents of her pantry, which were reviewed). She drinks water and adds raw sugar to her tea. She also drinks soda once a week. She drinks soy and almond milk, as she cannot tolerate cow's milk; she has never been diagnosed as lactose intolerant. She is very interested in being able to control her diabetes with diet. She disclosed that she fears she may have to resort to eating baby food, as she did this for a period of time to help her lose weight. She walks about 1-3 hours daily for her job; she also uses her home treadmill. She joined [employer] in 2010, but discontinued membership due to lack of transportation.   Diet recall: Breakfast (1:00 PM): scrambled eggs, sausage OR toast; Lunch (3-4 PM): oatmeal OR egg OR rice and beans; Dinner (9 PM): sandwich or soup; HS snack: bowl of cereal  Nutrition Diagnosis: Inconsistent carbohydrate intake r/t disordered eating pattern, nutrition related knowledge deficit AEB Hgb A1c: 11.6.   Nutrition Intervention: Nutrition rx:1200 kcal NAS, diabetic diet;  3 meals per day (4-5 hours apart); limit 1 starch per meal; 30 minutes physical activity daily; low calorie beverages only  Education/Counseling Provided: Educated pt on diabetic diet. Emphasized plate method, sources of carbohydrate, and portion sizes. Discussed importance of eating 3 meals per day and developing a consistent meal schedule. Encouraged pt for regular exercise. Discussed importance of limiting sugar  sweetened beverages and discussed alternatives. Majority of visit was spent counseling pt on lifestyle changes. Provided plate method handout.   Understanding, Motivation, Ability to Follow Recommendations: Expect fair to good compliance.  Monitoring and Evaluation: Goals: 1) 1-2# weight loss per week; 2) 3 meals per day; 3) Hgb A1c < 7.0  Recommendations: 1) For weight loss: 1095-1249 kcals daily; 2) Meals 4-5 hours apart; 3) Drink water or low calorie beverages (ex low calorie powdered drink mix); 4) Continue with physical activity  F/U: PRN. Provided RD contact information.   [name], RD  07/25/2011  Time: 10:00 AM

## 2011-08-06 DIAGNOSIS — N189 Chronic kidney disease, unspecified: Secondary | ICD-10-CM

## 2011-08-06 HISTORY — DX: Chronic kidney disease, unspecified: N18.9

## 2011-08-13 ENCOUNTER — Emergency Department (HOSPITAL_COMMUNITY): Payer: Medicare Other

## 2011-08-13 ENCOUNTER — Emergency Department (HOSPITAL_COMMUNITY)
Admission: EM | Admit: 2011-08-13 | Discharge: 2011-08-13 | Disposition: A | Discharge status: Home / Self Care | Payer: Medicare Other | Attending: Emergency Medicine | Admitting: Emergency Medicine

## 2011-08-13 ENCOUNTER — Encounter (HOSPITAL_COMMUNITY): Payer: Self-pay | Admitting: *Deleted

## 2011-08-13 DIAGNOSIS — E785 Hyperlipidemia, unspecified: Secondary | ICD-10-CM | POA: Insufficient documentation

## 2011-08-13 DIAGNOSIS — E119 Type 2 diabetes mellitus without complications: Secondary | ICD-10-CM | POA: Insufficient documentation

## 2011-08-13 DIAGNOSIS — R1013 Epigastric pain: Secondary | ICD-10-CM | POA: Insufficient documentation

## 2011-08-13 DIAGNOSIS — K859 Acute pancreatitis without necrosis or infection, unspecified: Secondary | ICD-10-CM

## 2011-08-13 DIAGNOSIS — I2510 Atherosclerotic heart disease of native coronary artery without angina pectoris: Secondary | ICD-10-CM | POA: Insufficient documentation

## 2011-08-13 DIAGNOSIS — Z7982 Long term (current) use of aspirin: Secondary | ICD-10-CM | POA: Insufficient documentation

## 2011-08-13 DIAGNOSIS — H409 Unspecified glaucoma: Secondary | ICD-10-CM | POA: Insufficient documentation

## 2011-08-13 DIAGNOSIS — R197 Diarrhea, unspecified: Secondary | ICD-10-CM | POA: Insufficient documentation

## 2011-08-13 DIAGNOSIS — Z794 Long term (current) use of insulin: Secondary | ICD-10-CM | POA: Insufficient documentation

## 2011-08-13 DIAGNOSIS — R112 Nausea with vomiting, unspecified: Secondary | ICD-10-CM | POA: Insufficient documentation

## 2011-08-13 DIAGNOSIS — Z79899 Other long term (current) drug therapy: Secondary | ICD-10-CM | POA: Insufficient documentation

## 2011-08-13 DIAGNOSIS — I10 Essential (primary) hypertension: Secondary | ICD-10-CM | POA: Insufficient documentation

## 2011-08-13 DIAGNOSIS — N2889 Other specified disorders of kidney and ureter: Secondary | ICD-10-CM

## 2011-08-13 DIAGNOSIS — N289 Disorder of kidney and ureter, unspecified: Secondary | ICD-10-CM | POA: Insufficient documentation

## 2011-08-13 LAB — COMPREHENSIVE METABOLIC PANEL
ALT: 18 U/L (ref 0–35)
AST: 14 U/L (ref 0–37)
Alkaline Phosphatase: 58 U/L (ref 39–117)
GFR calc Af Amer: 67 mL/min — ABNORMAL LOW (ref >90)
Glucose, Bld: 265 mg/dL — ABNORMAL HIGH (ref 70–99)
Potassium: 3.6 mEq/L (ref 3.5–5.1)
Sodium: 133 mEq/L — ABNORMAL LOW (ref 135–145)
Total Protein: 6.9 g/dL (ref 6.0–8.3)

## 2011-08-13 LAB — CBC
Hemoglobin: 13.4 g/dL (ref 12.0–15.0)
MCHC: 33.3 g/dL (ref 30.0–36.0)
Platelets: 170 10*3/uL (ref 150–400)
RBC: 4.39 MIL/uL (ref 3.87–5.11)

## 2011-08-13 MED ORDER — IOHEXOL 300 MG/ML  SOLN
100.0000 mL | Freq: Once | INTRAMUSCULAR | Status: AC | PRN
  Administered 2011-08-13: 100 mL via INTRAVENOUS

## 2011-08-13 MED ORDER — ONDANSETRON HCL 4 MG PO TABS: 4.0000 mg | ORAL_TABLET | Freq: Four times a day (QID) | ORAL | Status: AC

## 2011-08-13 MED ORDER — MORPHINE SULFATE 4 MG/ML IJ SOLN
4.0000 mg | Freq: Once | INTRAMUSCULAR | Status: AC
  Administered 2011-08-13: 4 mg via INTRAVENOUS
  Filled 2011-08-13: qty 1

## 2011-08-13 MED ORDER — HYDROCODONE-ACETAMINOPHEN 5-500 MG PO TABS: 1.0000 | ORAL_TABLET | Freq: Four times a day (QID) | ORAL | Status: DC | PRN

## 2011-08-13 MED ORDER — ONDANSETRON HCL 4 MG/2ML IJ SOLN
4.0000 mg | Freq: Once | INTRAMUSCULAR | Status: AC
  Administered 2011-08-13: 4 mg via INTRAVENOUS
  Filled 2011-08-13: qty 2

## 2011-08-13 MED ORDER — SODIUM CHLORIDE 0.9 % IV BOLUS (SEPSIS)
1000.0000 mL | Freq: Once | INTRAVENOUS | Status: AC
  Administered 2011-08-13: 1000 mL via INTRAVENOUS

## 2011-08-13 MED ORDER — HYDROMORPHONE HCL 2 MG PO TABS: 2.0000 mg | ORAL_TABLET | Freq: Four times a day (QID) | ORAL | Status: AC | PRN

## 2011-08-13 MED ORDER — MORPHINE SULFATE 4 MG/ML IJ SOLN
INTRAMUSCULAR | Status: AC
  Administered 2011-08-13: 4 mg
  Filled 2011-08-13: qty 1

## 2011-08-13 MED ORDER — SODIUM CHLORIDE 0.9 % IV SOLN: INTRAVENOUS | Status: DC

## 2011-08-13 NOTE — ED Provider Notes (Signed)
History     CSN: [id number]  Arrival date & time 08/13/11  0122   First MD Initiated Contact with Patient 08/13/11 0211      Chief Complaint  Patient presents with  . Nausea  . Emesis  . Abdominal Pain  . Diarrhea    (Consider location/radiation/quality/duration/timing/severity/associated sxs/prior treatment) The history is provided by the patient.   symptoms began with diarrhea about a week ago lasted a few days and resolved. 2 days later developed diarrhea again this time with some epigastric burning. No radiation of pain. mild to moderate in severity. Some intermittent nausea but no vomiting. No history of similar symptoms in the past. No trauma. No fevers. No chills. No right upper quadrant abdominal pain. Symptoms not associated with eating. Patient states she's been drinking lots of water to her diarrhea. She denies any blood in her stools. No mucus in stools. She is diabetic but denies any risk factors for or history of C. Difficile otherwise.  no recent antibiotics. No recent travel.  Past Medical History  Diagnosis Date  . CAD (coronary artery disease)     Cath 2008 total RCA with collaterals  . HTN (hypertension), benign   . Hyperlipidemia   . Diabetes mellitus   . Glaucoma     Past Surgical History  Procedure Date  . Neck surgery   . Rotator cuff repair     right arm  . Abdominal hysterectomy   . Nacrotizing faschitis     Family History  Problem Relation Age of Onset  . Heart attack Father     History  Substance Use Topics  . Smoking status: Former Research scientist (life sciences)  . Smokeless tobacco: Not on file  . Alcohol Use: No    OB History    Grav Para Term Preterm Abortions TAB SAB Ect Mult Living                  Review of Systems  Constitutional: Negative for fever and chills.  HENT: Negative for neck pain and neck stiffness.   Eyes: Negative for pain.  Respiratory: Negative for shortness of breath.   Cardiovascular: Negative for chest pain.    Gastrointestinal: Positive for abdominal pain and diarrhea.  Genitourinary: Negative for dysuria.  Musculoskeletal: Negative for back pain.  Skin: Negative for rash.  Neurological: Negative for headaches.  All other systems reviewed and are negative.    Allergies  Other  Home Medications   Current Outpatient Rx  Name Route Sig Dispense Refill  . ASPIRIN 325 MG PO TBEC Oral Take 325 mg by mouth daily.      Marland Kitchen BRIMONIDINE TARTRATE-TIMOLOL 0.2-0.5 % OP SOLN Both Eyes Place 1 drop into both eyes every 12 (twelve) hours.    Marland Kitchen EZETIMIBE-SIMVASTATIN 10-10 MG PO TABS Oral Take 1 tablet by mouth at bedtime.      Marland Kitchen GLIPIZIDE ER 5 MG PO TB24 Oral Take 10 mg by mouth daily.     Marland Kitchen HYDROCHLOROTHIAZIDE 25 MG PO TABS Oral Take 25 mg by mouth daily.      Marland Kitchen HYDROCODONE-ACETAMINOPHEN 7.5-325 MG PO TABS Oral Take 1 tablet by mouth 2 (two) times daily.    . INSULIN GLARGINE 100 UNIT/ML [place] SOLN Subcutaneous Inject 12 Units into the skin as needed.    . ISOSORBIDE MONONITRATE ER 30 MG PO TB24 Oral Take 30 mg by mouth daily.      Marland Kitchen METFORMIN HCL 500 MG PO TABS Oral Take 500 mg by mouth 2 (two) times daily with  a meal.      . METOPROLOL SUCCINATE ER 100 MG PO TB24 Oral Take 100 mg by mouth daily.      Marland Kitchen ONE-DAILY MULTI VITAMINS PO TABS Oral Take 1 tablet by mouth daily. MULTIPLE OTC SUPPLEMENTS    . NITROGLYCERIN 0.4 MG SL SUBL Sublingual Place 1 tablet (0.4 mg total) under the tongue every 5 (five) minutes as needed. 90 tablet 1  . RAMIPRIL 10 MG PO CAPS Oral Take 10 mg by mouth daily.      . SENNOSIDES 8.6 MG PO TABS Oral Take 1 tablet by mouth as needed.      BP 151/57  Pulse 73  Temp 98.1 F (36.7 C)  Resp 20  Wt 163 lb (73.936 kg)  SpO2 98%  Physical Exam  Constitutional: She is oriented to person, place, and time. She appears well-developed and well-nourished.  HENT:  Head: Normocephalic and atraumatic.  Eyes: Conjunctivae and EOM are normal. Pupils are equal, round, and reactive to light.   Neck: Trachea normal. Neck supple. No thyromegaly present.  Cardiovascular: Normal rate, regular rhythm, S1 normal, S2 normal and normal pulses.     No systolic murmur is present   No diastolic murmur is present  Pulses:      Radial pulses are 2+ on the right side, and 2+ on the left side.  Pulmonary/Chest: Effort normal and breath sounds normal. She has no wheezes. She has no rhonchi. She has no rales. She exhibits no tenderness.  Abdominal: Soft. Normal appearance and bowel sounds are normal. She exhibits no mass. There is no rebound, no guarding, no CVA tenderness and negative Murphy's sign.       Mild epigastric tenderness without peritonitis. No right quadrant tenderness. No lower abdominal tenderness.  Musculoskeletal:       BLE:s Calves nontender, no cords or erythema, negative Homans sign  Neurological: She is alert and oriented to person, place, and time. She has normal strength. No cranial nerve deficit or sensory deficit. GCS eye subscore is 4. GCS verbal subscore is 5. GCS motor subscore is 6.  Skin: Skin is warm and dry. No rash noted. She is not diaphoretic.  Psychiatric: Her speech is normal.       Cooperative and appropriate    ED Course  Procedures (including critical care time)  Labs Reviewed  CBC - Abnormal; Notable for the following:    WBC 11.0 (*)    All other components within normal limits  COMPREHENSIVE METABOLIC PANEL - Abnormal; Notable for the following:    Sodium 133 (*)    Glucose, Bld 265 (*)    Total Bilirubin 0.2 (*)    GFR calc non Af Amer 58 (*)    GFR calc Af Amer 67 (*)    All other components within normal limits  LIPASE, BLOOD - Abnormal; Notable for the following:    Lipase 80 (*)    All other components within normal limits   Ct Abdomen Pelvis W Contrast  08/13/2011  *RADIOLOGY REPORT*  Clinical Data: Diffuse abdominal pain; history of diabetes.  CT ABDOMEN AND PELVIS WITH CONTRAST  Technique:  Multidetector CT imaging of the abdomen and  pelvis was performed following the standard protocol during bolus administration of intravenous contrast.  Contrast: 127mL OMNIPAQUE IOHEXOL 300 MG/ML  SOLN  Comparison: MRI of the lumbar spine performed 03/04/2006  Findings: Mild bibasilar atelectasis or scarring is noted.  There is mild soft tissue edema noted about the pancreatic head and second segment  of the duodenum, raising question for mild pancreatitis.  Minimal soft tissue stranding is noted tracking along Gerota's fascia on the right side.  There is slight distension of the pancreatic duct at the pancreatic head.  There is no evidence of devascularization or pseudocyst formation.  The body and tail of the pancreas are unremarkable in appearance.  The liver and spleen are within normal limits.  Relatively high attenuation within the gallbladder likely reflects vicarious region of contrast.  The adrenal glands are within normal limits.  There is a relatively high attenuation slightly heterogeneous soft tissue mass arising at the anterior aspect of the left kidney, measuring 3.7 cm in size.  This raises concern for malignancy.  Scattered smaller bilateral renal cysts are seen.  The kidneys are otherwise unremarkable in appearance.  There is no evidence of hydronephrosis.  No renal or ureteral stones are identified.  No significant perinephric stranding is seen.  The small bowel is otherwise unremarkable in appearance.  The stomach is within normal limits.  No acute vascular abnormalities are seen.  Diffuse calcification is noted along the abdominal aorta and its branches, with associated intramural thrombus noted along the distal abdominal aorta, causing likely moderate luminal narrowing.  The appendix is normal in caliber and contains minimal air, without evidence for appendicitis.  The colon is largely filled with stool and scattered high-density material, and is unremarkable in appearance.  The bladder is mildly distended and grossly unremarkable in  appearance.  The patient is status post hysterectomy; no suspicious adnexal masses are seen.  No inguinal lymphadenopathy is seen.  No acute osseous abnormalities are identified.  IMPRESSION:  1.  Suspect very mild pancreatitis involving the pancreatic head, with mild soft tissue edema about the pancreatic head and second segment of the duodenum.  No evidence of devascularization or pseudocyst formation; the body and tail of the pancreas are unremarkable in appearance. 2.  Somewhat heterogeneous soft tissue mass noted arising at the anterior aspect of the left kidney, measuring 3.7 cm in size.  This is suspicious for primary renal malignancy.  Further evaluation is recommended. 3.  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 4.  Scattered small bilateral renal cysts seen. 5.  Mild bibasilar atelectasis or scarring noted.  Original Report Authenticated By: Santa Lighter, M.D.   Patient feeling much better on recheck at 5 AM is requesting to be discharged home. She has scheduled followup with her physician Dr. Lorriane Shire, tomorrow.  Case discussed with hospitalist on-call Dr. Megan Salon who recommends consulting primary care physician versus hospitalist.   Case discussed as above with Dr. Luan Pulling on call for Dr. Lorriane Shire. He agrees with plan outpatient followup given minimal symptoms and improved condition in the ED.    MDM   Abdominal pain and diarrhea with mild pancreatitis as above.CT results shared with patient and planned followup tomorrow in clinic as scheduled. She also has left renal mass concerning for malignancy. She will followup with her doctor regarding this as well.         Teressa Lower, MD 08/13/11 2257

## 2011-08-13 NOTE — ED Notes (Signed)
Pt assisted to restroom by Hassan Rowan, RN. Pt has no complaints at this time.

## 2011-08-13 NOTE — ED Notes (Signed)
Pt drinking contrast. 

## 2011-08-13 NOTE — Discharge Instructions (Signed)
Is very important that you followup as discussed regarding new diagnosis of pancreatitis in addition to concerning mass on the CT scan involving the left kidney.  Acute Pancreatitis  The pancreas is a large gland located behind your stomach. It produces (secretes) enzymes. These enzymes help digest food. It also releases the hormones glucagon and insulin. These hormones help regulate blood sugar. When the pancreas becomes inflamed, the disease is called pancreatitis. Inflammation of the pancreas occurs when enzymes from the pancreas begin attacking and digesting the pancreas.  CAUSES  Most cases ofsudden onset (acute) pancreatitis are caused by:  Alcohol abuse.  Gallstones.  Other less common causes are:  Some medications.  Exposure to certain chemicals  Infection.  Damage caused by an accident (trauma).  Surgery of the belly (abdomen).  SYMPTOMS  Acute pancreatitis usually begins with pain in the upper abdomen and may radiate to the back. This pain may last a couple days. The constant pain varies from mild to severe. The acute form of this disease may vary from mild, nonspecific abdominal pain to profound shock with coma. About 1 in 5 cases are severe. These patients become dehydrated and develop low blood pressure. In severe cases, bleeding into the pancreas can lead to shock and death. The lungs, heart, and kidneys may fail.  DIAGNOSIS  Your caregiver will form a clinical opinion after giving you an exam. Laboratory work is used to confirm this diagnosis. Often,a digestive enzyme from the pancreas (serum amylase) and other enzymes are elevated. Sugars and fats (lipids) in the blood may be elevated. There may also be changes in the following levels: calcium, magnesium, potassium, chloride and bicarbonate (chemicals in the blood). X-rays, a CT scan, or ultrasound of your abdomen may be necessary to search for other causes of your abdominal pain.  TREATMENT  Most pancreatitis requires treatment of  symptoms. Most acute attacks last a couple of days. Your caregiver can discuss the treatment options with you.  If complications occur, hospitalization may be necessary for pain control and intravenous (IV) fluid replacement.  Sometimes, a tube may be put into the stomach to control vomiting.  Food may not be allowed for 3 to 4 days. This gives the pancreas time to rest. Giving the pancreas a rest means there is no stimulation that would produce more enzymes and cause more damage.  Medicines (antibiotics) that kill germs may be given if infection is the cause.  Sometimes, surgery may be required.  Following an acute attack, your caregiver will determine the cause, if possible, and offer suggestions to prevent recurrences.  HOME CARE INSTRUCTIONS  Eat smaller, more frequent meals. This reduces the amount of digestive juices the pancreas produces.  Decrease the amount of fat in your diet. This may help reduce loose, diarrheal stools.  Drink enough water and fluids to keep your urine clear or pale yellow. This is to avoid dehydration which can cause increased pain.  Talk to your caregiver about pain relievers or other medicines that may help.  Avoid anything that may have triggered your pancreatitis (for example, alcohol).  Follow the diet advised by your caregiver. Do not advance the diet too soon.  Take medicines as prescribed.  Get plenty of rest.  Check your blood sugar at home as directed by your caregiver.  If your caregiver has given you a follow-up appointment, it is very important to keep that appointment. Not keeping the appointment could result in a lasting (chronic) or permanent injury, pain, and disability. If there  is any problem keeping the appointment, you must call to reschedule.  SEEK MEDICAL CARE IF:  You are not recovering in the time described by your caregiver.  You have persistent pain, weakness, or feel sick to your stomach (nauseous).  You have recovered and then have  another bout of pain.  SEEK IMMEDIATE MEDICAL CARE IF:  You are unable to eat or keep fluids down.  Your pain increases a lot or changes.  You have an oral temperature above 102 F (38.9 C), not controlled by medicine.  Your skin or the white part of your eyes look yellow (jaundice).  You develop vomiting.  You feel dizzy or faint.  Your blood sugar is high (over 300).  MAKE SURE YOU:  Understand these instructions.  Will watch your condition.  Will get help right away if you are not doing well or get worse.

## 2011-08-13 NOTE — ED Notes (Signed)
Pt lying in bed listening to music

## 2011-08-13 NOTE — ED Notes (Signed)
Pt c/o n/v/d and abdominal pain since Easter Monday.

## 2011-10-24 ENCOUNTER — Encounter: Payer: Self-pay | Admitting: Cardiovascular Disease

## 2011-10-24 ENCOUNTER — Ambulatory Visit (INDEPENDENT_AMBULATORY_CARE_PROVIDER_SITE_OTHER): Payer: Medicare Other | Admitting: Cardiovascular Disease

## 2011-10-24 ENCOUNTER — Other Ambulatory Visit: Payer: Self-pay

## 2011-10-24 VITALS — BP 108/65 | HR 77 | Resp 16 | Ht 59.0 in | Wt 165.0 lb

## 2011-10-24 DIAGNOSIS — I2510 Atherosclerotic heart disease of native coronary artery without angina pectoris: Secondary | ICD-10-CM

## 2011-10-24 DIAGNOSIS — E782 Mixed hyperlipidemia: Secondary | ICD-10-CM

## 2011-10-24 DIAGNOSIS — Z01818 Encounter for other preprocedural examination: Secondary | ICD-10-CM

## 2011-10-24 DIAGNOSIS — E119 Type 2 diabetes mellitus without complications: Secondary | ICD-10-CM

## 2011-10-24 DIAGNOSIS — I10 Essential (primary) hypertension: Secondary | ICD-10-CM

## 2011-10-24 NOTE — Assessment & Plan Note (Signed)
Cholesterol is at goal.  Continue current dose of statin and diet Rx.  No myalgias or side effects.  F/U  LFT's in 6 months. No results found for this basename: LDLCALC             

## 2011-10-24 NOTE — Assessment & Plan Note (Signed)
Preop clearence.  Myovue with Lexiscan.  No chest pain but moderate risk surgery in diabetic with known disease

## 2011-10-24 NOTE — Assessment & Plan Note (Signed)
Well controlled.  Continue current medications and low sodium Dash type diet.    

## 2011-10-24 NOTE — Assessment & Plan Note (Signed)
Now on insulin.  F/U Dr Lorriane Shire.  Discussed low carb diet.  Target hemoglobin A1c is 6.5 or less.  Continue current medications.

## 2011-10-24 NOTE — Progress Notes (Signed)
Patient ID: [name], female   DOB: [birth year], 61 y.o.   MRN: [id number] Jenisse returns today for followup. She has known coronary disease. Shehas a total right coronary artery with collaterals. She does not havecritical left-sided disease Last cath 2008  . I recall doing her catheterization in July2008. She did not have a lot of insight into her coronary disease. Itook pictures for and explained to her her anatomy. Her BS have been well controlled according to her. She does not know her A1c but it was just checked cy [facility]. She Denies SSCP, palpitations, or dyspnea. She has been compliant with her meds. She is active.   Was hospitalized for pancreatitis in April.  CT showed left renal mass 3.7cm.  Is scheduled to have robotic nephrectomy with   [facility]   [facility] [place]  Fax [phone] Phone [phone]  Needs clearence.  Discussed with patient.  She is a diabetic with known disease and having significant abdominal surgery.  Myovue will be abnormal due to collateralized RCA but should be able to risk stratify with myovue and only do cath to clear for surgery if scan high risk and suggestive of multivessel/left sided disease  ROS: Denies fever, malais, weight loss, blurry vision, decreased visual acuity, cough, sputum, SOB, hemoptysis, pleuritic pain, palpitaitons, heartburn, abdominal pain, melena, lower extremity edema, claudication, or rash.  All other systems reviewed and negative  General: Affect appropriate Obese black female HEENT: normal Neck supple with no adenopathy JVP normal no bruits no thyromegaly Lungs clear with no wheezing and good diaphragmatic motion Heart:  S1/S2 no murmur, no rub, gallop or click PMI normal Abdomen: benighn, BS positve, no tenderness, no AAA no bruit.  No HSM or HJR Distal pulses intact with no bruits Plus one bilateral edema Neuro non-focal Skin warm and dry No muscular weakness   Current  Outpatient Prescriptions  Medication Sig Dispense Refill  . aspirin 325 MG EC tablet Take 325 mg by mouth daily.        . brimonidine-timolol (COMBIGAN) 0.2-0.5 % ophthalmic solution Place 1 drop into both eyes every 12 (twelve) hours.      Marland Kitchen ezetimibe-simvastatin (VYTORIN) 10-10 MG per tablet Take 1 tablet by mouth at bedtime.        Marland Kitchen glipiZIDE (GLUCOTROL) 5 MG 24 hr tablet Take 10 mg by mouth daily.       . hydrochlorothiazide 25 MG tablet Take 25 mg by mouth daily.        Marland Kitchen HYDROcodone-acetaminophen (NORCO) 7.5-325 MG per tablet Take 1 tablet by mouth 2 (two) times daily.      . insulin glargine (LANTUS) 100 UNIT/ML injection Inject 12 Units into the skin as needed.      . isosorbide mononitrate (IMDUR) 30 MG 24 hr tablet Take 30 mg by mouth daily.        . metFORMIN (GLUCOPHAGE) 500 MG tablet Take 500 mg by mouth 2 (two) times daily with a meal.        . metoprolol (TOPROL-XL) 100 MG 24 hr tablet Take 100 mg by mouth daily.        . Multiple Vitamin (MULTIVITAMIN) tablet Take 1 tablet by mouth daily. MULTIPLE OTC SUPPLEMENTS      . nitroGLYCERIN (NITROSTAT) 0.4 MG SL tablet Place 1 tablet (0.4 mg total) under the tongue every 5 (five) minutes as needed.  90 tablet  1  . ramipril (ALTACE) 10 MG capsule Take 10  mg by mouth daily.        Marland Kitchen senna (SENOKOT) 8.6 MG tablet Take 1 tablet by mouth as needed.        Allergies  Other  Electrocardiogram:  NSR rate 77 LAD low voltage Old IMI   Assessment and Plan

## 2011-10-24 NOTE — Patient Instructions (Addendum)
**Note De-Identified  Obfuscation** Your physician has requested that you have a lexiscan myoview. For further information please visit [email or web address]. Please follow instruction sheet, as given.  Your physician recommends that you continue on your current medications as directed. Please refer to the Current Medication list given to you today.  Your physician recommends that you schedule a follow-up appointment in: 1 year

## 2011-10-31 ENCOUNTER — Encounter (HOSPITAL_COMMUNITY)
Admission: RE | Admit: 2011-10-31 | Discharge: 2011-10-31 | Disposition: A | Discharge status: Home / Self Care | Payer: Medicare Other | Source: Ambulatory Visit | Attending: Cardiovascular Disease | Admitting: Cardiovascular Disease

## 2011-10-31 ENCOUNTER — Encounter (HOSPITAL_COMMUNITY): Admission: RE | Admit: 2011-10-31 | Payer: Medicare Other | Source: Ambulatory Visit

## 2011-10-31 DIAGNOSIS — Z01818 Encounter for other preprocedural examination: Secondary | ICD-10-CM

## 2011-11-05 ENCOUNTER — Encounter (HOSPITAL_COMMUNITY)
Admission: RE | Admit: 2011-11-05 | Discharge: 2011-11-05 | Disposition: A | Discharge status: Home / Self Care | Payer: Medicare Other | Source: Ambulatory Visit | Attending: Cardiovascular Disease | Admitting: Cardiovascular Disease

## 2011-11-05 ENCOUNTER — Ambulatory Visit (INDEPENDENT_AMBULATORY_CARE_PROVIDER_SITE_OTHER): Payer: Medicare Other

## 2011-11-05 ENCOUNTER — Encounter (HOSPITAL_COMMUNITY): Payer: Self-pay

## 2011-11-05 DIAGNOSIS — Z01810 Encounter for preprocedural cardiovascular examination: Secondary | ICD-10-CM | POA: Insufficient documentation

## 2011-11-05 DIAGNOSIS — Z01818 Encounter for other preprocedural examination: Secondary | ICD-10-CM

## 2011-11-05 DIAGNOSIS — E785 Hyperlipidemia, unspecified: Secondary | ICD-10-CM | POA: Insufficient documentation

## 2011-11-05 DIAGNOSIS — I2510 Atherosclerotic heart disease of native coronary artery without angina pectoris: Secondary | ICD-10-CM

## 2011-11-05 DIAGNOSIS — E119 Type 2 diabetes mellitus without complications: Secondary | ICD-10-CM | POA: Insufficient documentation

## 2011-11-05 HISTORY — DX: Malignant (primary) neoplasm, unspecified: C80.1

## 2011-11-05 MED ORDER — TECHNETIUM TC 99M TETROFOSMIN IV KIT
30.0000 | PACK | Freq: Once | INTRAVENOUS | Status: AC | PRN
  Administered 2011-11-05: 30 via INTRAVENOUS

## 2011-11-05 MED ORDER — TECHNETIUM TC 99M TETROFOSMIN IV KIT
10.0000 | PACK | Freq: Once | INTRAVENOUS | Status: AC | PRN
  Administered 2011-11-05: 10 via INTRAVENOUS

## 2011-11-05 NOTE — Progress Notes (Signed)
Stress Lab Nurses Notes - [name]  [name] 11/05/2011 Reason for doing test: CAD and Surgical Clearance Type of test: Leane Call Nurse performing test: Gerrit Halls, RN Nuclear Medicine Tech: Melburn Hake Echo Tech: Not Applicable MD performing test: Myles Gip & Jory Sims NP Family MD: [facility] Test explained and consent signed: yes IV started: 22g jelco, Saline lock flushed, No redness or edema and Saline lock started in radiology Symptoms: chest tightness Treatment/Intervention: None Reason test stopped: protocol completed After recovery IV was: Discontinued via X-ray tech and No redness or edema Patient to return to [place]. Med at : 12:45 Patient discharged: Home Patient's Condition upon discharge was: stable Comments: During test BP 132/68 & HR 93.  Recovery BP 128/68 & HR 90.  Symptoms resolved in recovery. Geanie Cooley T

## 2011-11-07 ENCOUNTER — Encounter: Payer: Self-pay | Admitting: *Deleted

## 2011-11-16 DIAGNOSIS — K859 Acute pancreatitis without necrosis or infection, unspecified: Secondary | ICD-10-CM | POA: Insufficient documentation

## 2011-11-16 DIAGNOSIS — M726 Necrotizing fasciitis: Secondary | ICD-10-CM | POA: Insufficient documentation

## 2011-11-16 DIAGNOSIS — Z720 Tobacco use: Secondary | ICD-10-CM | POA: Insufficient documentation

## 2012-03-20 DIAGNOSIS — C649 Malignant neoplasm of unspecified kidney, except renal pelvis: Secondary | ICD-10-CM | POA: Insufficient documentation

## 2012-07-04 ENCOUNTER — Encounter (HOSPITAL_COMMUNITY): Payer: Self-pay | Admitting: Pharmacy Technician

## 2012-07-15 ENCOUNTER — Encounter (HOSPITAL_COMMUNITY): Payer: Self-pay

## 2012-07-15 ENCOUNTER — Encounter (HOSPITAL_COMMUNITY)
Admission: RE | Admit: 2012-07-15 | Discharge: 2012-07-15 | Disposition: A | Discharge status: Home / Self Care | Payer: 59 | Source: Ambulatory Visit | Attending: Ophthalmology | Admitting: Ophthalmology

## 2012-07-15 HISTORY — DX: Chronic kidney disease, unspecified: N18.9

## 2012-07-15 HISTORY — DX: Myoneural disorder, unspecified: G70.9

## 2012-07-15 HISTORY — DX: Shortness of breath: R06.02

## 2012-07-15 HISTORY — DX: Pneumonia, unspecified organism: J18.9

## 2012-07-15 HISTORY — DX: Unspecified osteoarthritis, unspecified site: M19.90

## 2012-07-15 HISTORY — DX: Acute myocardial infarction, unspecified: I21.9

## 2012-07-15 HISTORY — DX: Unspecified convulsions: R56.9

## 2012-07-15 LAB — BASIC METABOLIC PANEL
CO2: 29 mEq/L (ref 19–32)
Chloride: 104 mEq/L (ref 96–112)
Glucose, Bld: 108 mg/dL — ABNORMAL HIGH (ref 70–99)
Potassium: 3.9 mEq/L (ref 3.5–5.1)
Sodium: 142 mEq/L (ref 135–145)

## 2012-07-15 MED ORDER — CYCLOPENTOLATE-PHENYLEPHRINE 0.2-1 % OP SOLN
OPHTHALMIC | Status: AC
  Filled 2012-07-15: qty 2

## 2012-07-15 NOTE — Patient Instructions (Addendum)
Your procedure is scheduled on:  07/21/2012  Report to [name] at   6:15   AM.  Call this number if you have problems the morning of surgery: [phone]   Remember:   Do not eat or drink :After Midnight.    Take these medicines the morning of surgery with A SIP OF WATER: Metoprplol, Altace, Imdur and use albuterol inhaler   Do not wear jewelry, make-up or nail polish.  Do not wear lotions, powders, or perfumes. You may wear deodorant.  Do not shave 48 hours prior to surgery.  Do not bring valuables to the hospital.  Contacts, dentures or bridgework may not be worn into surgery.  Patients discharged the day of surgery will not be allowed to drive home.  Name and phone number of your driver:    Please read over the following fact sheets that you were given: Pain Booklet, Surgical Site Infection Prevention, Anesthesia Post-op Instructions and Care and Recovery After Surgery  Cataract Surgery  A cataract is a clouding of the lens of the eye. When a lens becomes cloudy, vision is reduced based on the degree and nature of the clouding. Surgery may be needed to improve vision. Surgery removes the cloudy lens and usually replaces it with a substitute lens (intraocular lens, IOL). LET YOUR EYE DOCTOR KNOW ABOUT:  Allergies to food or medicine.   Medicines taken including herbs, eyedrops, over-the-counter medicines, and creams.   Use of steroids (by mouth or creams).   Previous problems with anesthetics or numbing medicine.   History of bleeding problems or blood clots.   Previous surgery.   Other health problems, including diabetes and kidney problems.   Possibility of pregnancy, if this applies.  RISKS AND COMPLICATIONS  Infection.   Inflammation of the eyeball (endophthalmitis) that can spread to both eyes (sympathetic ophthalmia).   Poor wound healing.   If an IOL is inserted, it can later fall out of proper position. This is very uncommon.   Clouding of the part of  your eye that holds an IOL in place. This is called an "after-cataract." These are uncommon, but easily treated.  BEFORE THE PROCEDURE  Do not eat or drink anything except small amounts of water for 8 to 12 before your surgery, or as directed by your caregiver.   Unless you are told otherwise, continue any eyedrops you have been prescribed.   Talk to your primary caregiver about all other medicines that you take (both prescription and non-prescription). In some cases, you may need to stop or change medicines near the time of your surgery. This is most important if you are taking blood-thinning medicine.Do not stop medicines unless you are told to do so.   Arrange for someone to drive you to and from the procedure.   Do not put contact lenses in either eye on the day of your surgery.  PROCEDURE There is more than one method for safely removing a cataract. Your doctor can explain the differences and help determine which is best for you. Phacoemulsification surgery is the most common form of cataract surgery.  An injection is given behind the eye or eyedrops are given to make this a painless procedure.   A small cut (incision) is made on the edge of the clear, dome-shaped surface that covers the front of the eye (cornea).   A tiny probe is painlessly inserted into the eye. This device gives off ultrasound waves that soften and break up the cloudy center of the  lens. This makes it easier for the cloudy lens to be removed by suction.   An IOL may be implanted.   The normal lens of the eye is covered by a clear capsule. Part of that capsule is intentionally left in the eye to support the IOL.   Your surgeon may or may not use stitches to close the incision.  There are other forms of cataract surgery that require a larger incision and stiches to close the eye. This approach is taken in cases where the doctor feels that the cataract cannot be easily removed using phacoemulsification. AFTER THE  PROCEDURE  When an IOL is implanted, it does not need care. It becomes a permanent part of your eye and cannot be seen or felt.   Your doctor will schedule follow-up exams to check on your progress.   Review your other medicines with your doctor to see which can be resumed after surgery.   Use eyedrops or take medicine as prescribed by your doctor.  Document Released: 04/12/2011 Document Reviewed: 04/09/2011 [facility] Patient Information 2012 [place].  .Cataract Surgery Care After Refer to this sheet in the next few weeks. These instructions provide you with information on caring for yourself after your procedure. Your caregiver may also give you more specific instructions. Your treatment has been planned according to current medical practices, but problems sometimes occur. Call your caregiver if you have any problems or questions after your procedure.  HOME CARE INSTRUCTIONS   Avoid strenuous activities as directed by your caregiver.   Ask your caregiver when you can resume driving.   Use eyedrops or other medicines to help healing and control pressure inside your eye as directed by your caregiver.   Only take over-the-counter or prescription medicines for pain, discomfort, or fever as directed by your caregiver.   Do not to touch or rub your eyes.   You may be instructed to use a protective shield during the first few days and nights after surgery. If not, wear sunglasses to protect your eyes. This is to protect the eye from pressure or from being accidentally bumped.   Keep the area around your eye clean and dry. Avoid swimming or allowing water to hit you directly in the face while showering. Keep soap and shampoo out of your eyes.   Do not bend or lift heavy objects. Bending increases pressure in the eye. You can walk, climb stairs, and do light household chores.   Do not put a contact lens into the eye that had surgery until your caregiver says it is okay to do so.    Ask your doctor when you can return to work. This will depend on the kind of work that you do. If you work in a dusty environment, you may be advised to wear protective eyewear for a period of time.   Ask your caregiver when it will be safe to engage in sexual activity.   Continue with your regular eye exams as directed by your caregiver.  What to expect:  It is normal to feel itching and mild discomfort for a few days after cataract surgery. Some fluid discharge is also common, and your eye may be sensitive to light and touch.   After 1 to 2 days, even moderate discomfort should disappear. In most cases, healing will take about 6 weeks.   If you received an intraocular lens (IOL), you may notice that colors are very bright or have a blue tinge. Also, if you have been  in bright sunlight, everything may appear reddish for a few hours. If you see these color tinges, it is because your lens is clear and no longer cloudy. Within a few months after receiving an IOL, these extra colors should go away. When you have healed, you will probably need new glasses.  SEEK MEDICAL CARE IF:   You have increased bruising around your eye.   You have discomfort not helped by medicine.  SEEK IMMEDIATE MEDICAL CARE IF:   You have a fever.   You have a worsening or sudden vision loss.   You have redness, swelling, or increasing pain in the eye.   You have a thick discharge from the eye that had surgery.  MAKE SURE YOU:  Understand these instructions.   Will watch your condition.   Will get help right away if you are not doing well or get worse.  Document Released: 11/10/2004 Document Revised: 04/12/2011 Document Reviewed: 12/15/2010 [facility] Patient Information 2012 [place].

## 2012-07-21 ENCOUNTER — Encounter (HOSPITAL_COMMUNITY)
Admission: RE | Disposition: A | Discharge status: Home / Self Care | Payer: Self-pay | Source: Ambulatory Visit | Attending: Ophthalmology

## 2012-07-21 ENCOUNTER — Encounter (HOSPITAL_COMMUNITY): Payer: Self-pay | Admitting: Anesthesiology

## 2012-07-21 ENCOUNTER — Ambulatory Visit (HOSPITAL_COMMUNITY)
Admission: RE | Admit: 2012-07-21 | Discharge: 2012-07-21 | Disposition: A | Discharge status: Home / Self Care | Payer: 59 | Source: Ambulatory Visit | Attending: Ophthalmology | Admitting: Ophthalmology

## 2012-07-21 ENCOUNTER — Ambulatory Visit (HOSPITAL_COMMUNITY): Payer: 59 | Admitting: Anesthesiology

## 2012-07-21 ENCOUNTER — Encounter (HOSPITAL_COMMUNITY): Payer: Self-pay

## 2012-07-21 DIAGNOSIS — Z01812 Encounter for preprocedural laboratory examination: Secondary | ICD-10-CM | POA: Insufficient documentation

## 2012-07-21 DIAGNOSIS — H2510 Age-related nuclear cataract, unspecified eye: Secondary | ICD-10-CM | POA: Insufficient documentation

## 2012-07-21 DIAGNOSIS — Z5309 Procedure and treatment not carried out because of other contraindication: Secondary | ICD-10-CM | POA: Insufficient documentation

## 2012-07-21 LAB — GLUCOSE, CAPILLARY: Glucose-Capillary: 214 mg/dL — ABNORMAL HIGH (ref 70–99)

## 2012-07-21 SURGERY — CANCELLED PROCEDURE
Anesthesia: Monitor Anesthesia Care

## 2012-07-21 MED ORDER — CYCLOPENTOLATE-PHENYLEPHRINE 0.2-1 % OP SOLN
1.0000 [drp] | Freq: Once | OPHTHALMIC | Status: AC
  Administered 2012-07-21: 1 [drp] via OPHTHALMIC

## 2012-07-21 MED ORDER — LIDOCAINE HCL 3.5 % OP GEL
OPHTHALMIC | Status: AC
  Filled 2012-07-21: qty 5

## 2012-07-21 MED ORDER — EPINEPHRINE HCL 1 MG/ML IJ SOLN
INTRAMUSCULAR | Status: AC
  Filled 2012-07-21: qty 1

## 2012-07-21 MED ORDER — GATIFLOXACIN 0.5 % OP SOLN OPTIME - NO CHARGE
1.0000 [drp] | Freq: Once | OPHTHALMIC | Status: AC
  Administered 2012-07-21: 1 [drp] via OPHTHALMIC
  Filled 2012-07-21: qty 2.5

## 2012-07-21 MED ORDER — KETOROLAC TROMETHAMINE 0.4 % OP SOLN - NO CHARGE
1.0000 [drp] | Freq: Once | OPHTHALMIC | Status: AC
  Administered 2012-07-21: 1 [drp] via OPHTHALMIC
  Filled 2012-07-21: qty 5

## 2012-07-21 MED ORDER — TETRACAINE HCL 0.5 % OP SOLN: 1.0000 [drp] | Freq: Once | OPHTHALMIC | Status: DC

## 2012-07-21 MED ORDER — MOXIFLOXACIN HCL 0.5 % OP SOLN - NO CHARGE
1.0000 [drp] | Freq: Once | OPHTHALMIC | Status: DC
  Filled 2012-07-21: qty 3

## 2012-07-21 MED ORDER — MIDAZOLAM HCL 2 MG/2ML IJ SOLN
INTRAMUSCULAR | Status: AC
  Filled 2012-07-21: qty 2

## 2012-07-21 MED ORDER — TETRACAINE HCL 0.5 % OP SOLN
OPHTHALMIC | Status: AC
  Filled 2012-07-21: qty 2

## 2012-07-21 SURGICAL SUPPLY — 6 items
CLOTH BEACON ORANGE TIMEOUT ST (SAFETY) ×2 IMPLANT
GLOVE BIOGEL PI IND STRL 7.0 (GLOVE) ×1 IMPLANT
GLOVE BIOGEL PI INDICATOR 7.0 (GLOVE) ×1
INST SET CATARACT ~~LOC~~ (KITS) ×3 IMPLANT
PAD ARMBOARD 7.5X6 YLW CONV (MISCELLANEOUS) ×2 IMPLANT
WATER STERILE IRR 250ML POUR (IV SOLUTION) ×2 IMPLANT

## 2012-07-21 NOTE — Anesthesia Preprocedure Evaluation (Signed)
Anesthesia Evaluation  Patient identified by MRN, date of birth, ID band Patient awake    Reviewed: Allergy & Precautions, H&P , NPO status , Patient's Chart, lab work & pertinent test results  Airway Mallampati: II  Neck ROM: Full    Dental  (+) Partial Upper and Partial Lower   Pulmonary shortness of breath and with exertion, pneumonia -, resolved,  breath sounds clear to auscultation        Cardiovascular hypertension, Pt. on medications + CAD ( ) and + Past MI Rhythm:Regular Rate:Normal     Neuro/Psych Seizures -, Well Controlled,     GI/Hepatic   Endo/Other  diabetes, Type 2  Renal/GU Renal InsufficiencyRenal disease     Musculoskeletal   Abdominal   Peds  Hematology   Anesthesia Other Findings   Reproductive/Obstetrics                           Anesthesia Physical Anesthesia Plan  ASA: III  Anesthesia Plan: MAC   Post-op Pain Management:    Induction: Intravenous  Airway Management Planned: Nasal Cannula  Additional Equipment:   Intra-op Plan:   Post-operative Plan:   Informed Consent: I have reviewed the patients History and Physical, chart, labs and discussed the procedure including the risks, benefits and alternatives for the proposed anesthesia with the patient or authorized representative who has indicated his/her understanding and acceptance.     Plan Discussed with:   Anesthesia Plan Comments:         Anesthesia Quick Evaluation

## 2012-07-21 NOTE — OR Nursing (Signed)
Office called to inform  Dr. Romona Curls of consult with patient . Patient waiting before discharge to see if he can see patient today.

## 2012-07-21 NOTE — OR Nursing (Signed)
Attempted multiple times to gain IV access for surgery. Dr Patsey Berthold also attempted multiple time both with peripheral iv and IJ attempt x3. Dr Iona Hansen notified and in to talk to patient. Patient cancelled for now. Dr Romona Curls paged to talk with patient about permanent IV access. Patient up to postop and drinking gingerale waiting on DR [facility].

## 2012-07-21 NOTE — H&P (Signed)
I have reviewed the pre printed H&P, the patient was re-examined, and I have identified no significant interval changes in the patient's medical condition.  There is no change in the plan of care since the history and physical of record. 

## 2012-07-21 NOTE — Progress Notes (Signed)
Patient has no available IV access sites on physical inspection or ultrasound.  After consultation with Dr. Patsey Berthold the procedure was cancelled and a consult with Dr. Romona Curls was requested for a  permanent IV access procedure.  Upon resolution the cataract surgery will be reconsidered.

## 2012-07-25 ENCOUNTER — Encounter (HOSPITAL_COMMUNITY): Payer: Self-pay | Admitting: Pharmacy Technician

## 2012-08-01 ENCOUNTER — Ambulatory Visit (HOSPITAL_COMMUNITY)
Admission: RE | Admit: 2012-08-01 | Discharge: 2012-08-01 | Disposition: A | Discharge status: Home / Self Care | Payer: 59 | Source: Ambulatory Visit | Attending: Ophthalmology | Admitting: Ophthalmology

## 2012-08-01 ENCOUNTER — Inpatient Hospital Stay (HOSPITAL_COMMUNITY): Admission: RE | Admit: 2012-08-01 | Payer: 59 | Source: Ambulatory Visit

## 2012-08-04 ENCOUNTER — Encounter (HOSPITAL_COMMUNITY): Admission: RE | Payer: Self-pay | Source: Ambulatory Visit

## 2012-08-04 ENCOUNTER — Ambulatory Visit (HOSPITAL_COMMUNITY): Admission: RE | Admit: 2012-08-04 | Payer: 59 | Source: Ambulatory Visit | Admitting: General Surgery

## 2012-08-04 SURGERY — INSERTION, TUNNELED CENTRAL VENOUS DEVICE, WITH PORT
Anesthesia: Choice

## 2012-08-13 ENCOUNTER — Ambulatory Visit (HOSPITAL_COMMUNITY)
Admission: RE | Admit: 2012-08-13 | Discharge: 2012-08-13 | Disposition: A | Discharge status: Home / Self Care | Payer: PRIVATE HEALTH INSURANCE | Source: Ambulatory Visit | Attending: Family Medicine | Admitting: Family Medicine

## 2012-08-13 ENCOUNTER — Other Ambulatory Visit (HOSPITAL_COMMUNITY): Payer: Self-pay | Admitting: Family Medicine

## 2012-08-13 DIAGNOSIS — M545 Low back pain, unspecified: Secondary | ICD-10-CM | POA: Insufficient documentation

## 2012-08-13 DIAGNOSIS — M25559 Pain in unspecified hip: Secondary | ICD-10-CM | POA: Insufficient documentation

## 2012-08-13 DIAGNOSIS — M1990 Unspecified osteoarthritis, unspecified site: Secondary | ICD-10-CM

## 2012-08-13 DIAGNOSIS — Z9181 History of falling: Secondary | ICD-10-CM | POA: Insufficient documentation

## 2012-09-15 ENCOUNTER — Ambulatory Visit (INDEPENDENT_AMBULATORY_CARE_PROVIDER_SITE_OTHER): Payer: 59 | Admitting: Cardiovascular Disease

## 2012-09-15 ENCOUNTER — Encounter: Payer: Self-pay | Admitting: Cardiovascular Disease

## 2012-09-15 VITALS — BP 116/64 | HR 86 | Ht 60.0 in

## 2012-09-15 DIAGNOSIS — E782 Mixed hyperlipidemia: Secondary | ICD-10-CM

## 2012-09-15 DIAGNOSIS — I2510 Atherosclerotic heart disease of native coronary artery without angina pectoris: Secondary | ICD-10-CM

## 2012-09-15 DIAGNOSIS — I10 Essential (primary) hypertension: Secondary | ICD-10-CM

## 2012-09-15 NOTE — Patient Instructions (Signed)
Your physician wants you to follow-up in: 1 year with Dr. Nishan You will receive a reminder letter in the mail two months in advance. If you don't receive a letter, please call our office to schedule the follow-up appointment.  Your physician recommends that you continue on your current medications as directed. Please refer to the Current Medication list given to you today.  

## 2012-09-15 NOTE — Assessment & Plan Note (Signed)
Well controlled.  Continue current medications and low sodium Dash type diet.    

## 2012-09-15 NOTE — Assessment & Plan Note (Signed)
Stable with no angina and good activity level.  Continue medical Rx  

## 2012-09-15 NOTE — Progress Notes (Signed)
Patient ID: [name], female   DOB: 03/04/1951, 62 y.o.   MRN: [id number] [name] returns today for followup. She has known coronary disease. Shehas a total right coronary artery with collaterals. She does not havecritical left-sided disease Last cath 2008 . I recall doing her catheterization in July2008. She did not have a lot of insight into her coronary disease. I took pictures for and explained to her her anatomy. Her BS have been well controlled according to her. She does not know her A1c but it was just checked by [facility]. She Denies SSCP, palpitations, or dyspnea. She has been compliant with her meds. She is active.  Was hospitalized for pancreatitis in April 2013 . 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 There were no diagnostic ST-segment changes. Perfusion imaging is consistent with breast attenuation in the anteroseptal distribution, and probable scar in the inferoposterior wall with minor lateral apical periinfarct ischemia. LVEF is 50% with basal septal hypokinesis.   ROS: Denies fever, malais, weight loss, blurry vision, decreased visual acuity, cough, sputum, SOB, hemoptysis, pleuritic pain, palpitaitons, heartburn, abdominal pain, melena, lower extremity edema, claudication, or rash.  All other systems reviewed and negative  General: Affect appropriate Obese black female HEENT: normal Neck supple with no adenopathy JVP normal no bruits no thyromegaly Lungs clear with no wheezing and good diaphragmatic motion Heart:  S1/S2 no murmur, no rub, gallop or click PMI normal Abdomen: benighn, BS positve, no tenderness, no AAA robotic left lap nephrectomy scar no bruit.  No HSM or HJR Distal pulses intact with no bruits No edema Neuro non-focal Skin warm and dry No muscular weakness   Current Outpatient Prescriptions  Medication Sig Dispense Refill  .  albuterol (PROVENTIL HFA;VENTOLIN HFA) 108 (90 BASE) MCG/ACT inhaler Inhale 2 puffs into the lungs every 6 (six) hours as needed for wheezing.      Marland Kitchen aspirin EC 81 MG tablet Take 81 mg by mouth daily.      . brimonidine-timolol (COMBIGAN) 0.2-0.5 % ophthalmic solution Place 1 drop into both eyes 2 (two) times daily.       . Coenzyme Q10 (CO Q 10) 100 MG CAPS Take 1 tablet by mouth at bedtime.      Marland Kitchen doxycycline (VIBRA-TABS) 100 MG tablet Take 100 mg by mouth 2 (two) times daily.      Marland Kitchen ezetimibe-simvastatin (VYTORIN) 10-10 MG per tablet Take 1 tablet by mouth at bedtime.        . Glucosamine-Chondroit-Vit C-Mn (GLUCOSAMINE CHONDR 1500 COMPLX PO) Take 1 tablet by mouth 2 (two) times daily.      . hydrochlorothiazide 25 MG tablet Take 25 mg by mouth daily.        . insulin aspart protamine-insulin aspart (NOVOLOG 70/30) (70-30) 100 UNIT/ML injection Inject 20 Units into the skin 2 (two) times daily with a meal.       . insulin glargine (LANTUS) 100 UNIT/ML injection Inject 40 Units into the skin at bedtime.       . isosorbide mononitrate (IMDUR) 30 MG 24 hr tablet Take 30 mg by mouth daily.        . magnesium oxide (MAG-OX) 400 MG tablet Take 400 mg by mouth daily.      . metoprolol (TOPROL-XL) 100 MG 24 hr tablet Take 100 mg by mouth daily.        . Multiple Vitamin (MULTIVITAMIN) tablet Take 1  tablet by mouth daily.       . nitroGLYCERIN (NITROSTAT) 0.4 MG SL tablet Place 0.4 mg under the tongue every 5 (five) minutes as needed for chest pain.      Marland Kitchen oxyCODONE (OXY IR/ROXICODONE) 5 MG immediate release tablet Take 5 mg by mouth every 4 (four) hours as needed for pain.      Marland Kitchen oxymetazoline (AFRIN) 0.05 % nasal spray Place 2 sprays into the nose 2 (two) times daily.      . ramipril (ALTACE) 10 MG capsule Take 10 mg by mouth daily.        Marland Kitchen senna (SENOKOT) 8.6 MG tablet Take 1 tablet by mouth as needed for constipation.       Marland Kitchen Spirulina 500 MG TABS Take 500 mg by mouth 3 (three) times daily.        No current facility-administered medications for this visit.    Allergies  Other; Propine; Travatan; and Xalatan  Electrocardiogram:  SR rate 81 poor R wave progression otherwise normal and no change from 2013  Assessment and Plan

## 2012-09-15 NOTE — Assessment & Plan Note (Signed)
Cholesterol is at goal.  Continue current dose of statin and diet Rx.  No myalgias or side effects.  F/U  LFT's in 6 months. No results found for this basename: LDLCALC             

## 2012-10-02 ENCOUNTER — Other Ambulatory Visit (HOSPITAL_COMMUNITY): Payer: Self-pay | Admitting: Orthopaedic Surgery

## 2012-10-02 DIAGNOSIS — M545 Low back pain: Secondary | ICD-10-CM

## 2012-10-07 ENCOUNTER — Ambulatory Visit (HOSPITAL_COMMUNITY)
Admission: RE | Admit: 2012-10-07 | Discharge: 2012-10-07 | Disposition: A | Discharge status: Home / Self Care | Payer: PRIVATE HEALTH INSURANCE | Source: Ambulatory Visit | Attending: Orthopaedic Surgery | Admitting: Orthopaedic Surgery

## 2012-10-07 DIAGNOSIS — M545 Low back pain: Secondary | ICD-10-CM

## 2012-10-22 ENCOUNTER — Ambulatory Visit: Payer: 59 | Admitting: Cardiovascular Disease

## 2012-10-23 ENCOUNTER — Ambulatory Visit (HOSPITAL_COMMUNITY): Admission: RE | Admit: 2012-10-23 | Payer: PRIVATE HEALTH INSURANCE | Source: Ambulatory Visit

## 2012-10-27 ENCOUNTER — Other Ambulatory Visit (HOSPITAL_COMMUNITY): Payer: Self-pay | Admitting: Orthopaedic Surgery

## 2012-10-27 DIAGNOSIS — M545 Low back pain: Secondary | ICD-10-CM

## 2012-11-03 ENCOUNTER — Ambulatory Visit (HOSPITAL_COMMUNITY)
Admission: RE | Admit: 2012-11-03 | Discharge: 2012-11-03 | Disposition: A | Discharge status: Home / Self Care | Payer: PRIVATE HEALTH INSURANCE | Source: Ambulatory Visit | Attending: Orthopaedic Surgery | Admitting: Orthopaedic Surgery

## 2012-11-03 DIAGNOSIS — M545 Low back pain, unspecified: Secondary | ICD-10-CM | POA: Insufficient documentation

## 2012-11-03 DIAGNOSIS — M5124 Other intervertebral disc displacement, thoracic region: Secondary | ICD-10-CM | POA: Insufficient documentation

## 2012-11-03 MED ORDER — GADOBENATE DIMEGLUMINE 529 MG/ML IV SOLN
15.0000 mL | Freq: Once | INTRAVENOUS | Status: AC | PRN
  Administered 2012-11-03: 15 mL via INTRAVENOUS

## 2013-01-27 ENCOUNTER — Ambulatory Visit (HOSPITAL_COMMUNITY)
Admission: RE | Admit: 2013-01-27 | Discharge: 2013-01-27 | Disposition: A | Discharge status: Home / Self Care | Payer: PRIVATE HEALTH INSURANCE | Source: Ambulatory Visit | Attending: Family Medicine | Admitting: Family Medicine

## 2013-01-27 ENCOUNTER — Other Ambulatory Visit (HOSPITAL_COMMUNITY): Payer: Self-pay | Admitting: Family Medicine

## 2013-01-27 DIAGNOSIS — R079 Chest pain, unspecified: Secondary | ICD-10-CM | POA: Insufficient documentation

## 2013-01-27 DIAGNOSIS — R0781 Pleurodynia: Secondary | ICD-10-CM

## 2013-03-20 ENCOUNTER — Other Ambulatory Visit (HOSPITAL_COMMUNITY): Payer: Self-pay | Admitting: Family Medicine

## 2013-03-20 DIAGNOSIS — Z139 Encounter for screening, unspecified: Secondary | ICD-10-CM

## 2013-04-09 ENCOUNTER — Ambulatory Visit (HOSPITAL_COMMUNITY)
Admission: RE | Admit: 2013-04-09 | Discharge: 2013-04-09 | Disposition: A | Discharge status: Home / Self Care | Payer: PRIVATE HEALTH INSURANCE | Source: Ambulatory Visit | Attending: Family Medicine | Admitting: Family Medicine

## 2013-04-09 DIAGNOSIS — Z139 Encounter for screening, unspecified: Secondary | ICD-10-CM

## 2013-04-09 DIAGNOSIS — Z1231 Encounter for screening mammogram for malignant neoplasm of breast: Secondary | ICD-10-CM | POA: Insufficient documentation

## 2013-10-29 ENCOUNTER — Ambulatory Visit (INDEPENDENT_AMBULATORY_CARE_PROVIDER_SITE_OTHER): Payer: 59 | Admitting: Cardiovascular Disease

## 2013-10-29 VITALS — BP 130/80 | HR 74 | Ht 59.0 in | Wt 174.0 lb

## 2013-10-29 DIAGNOSIS — I2510 Atherosclerotic heart disease of native coronary artery without angina pectoris: Secondary | ICD-10-CM

## 2013-10-29 DIAGNOSIS — E782 Mixed hyperlipidemia: Secondary | ICD-10-CM

## 2013-10-29 DIAGNOSIS — E1165 Type 2 diabetes mellitus with hyperglycemia: Secondary | ICD-10-CM

## 2013-10-29 DIAGNOSIS — E119 Type 2 diabetes mellitus without complications: Secondary | ICD-10-CM

## 2013-10-29 DIAGNOSIS — I10 Essential (primary) hypertension: Secondary | ICD-10-CM

## 2013-10-29 NOTE — Assessment & Plan Note (Signed)
Stable with no angina and good activity level.  Continue medical Rx  

## 2013-10-29 NOTE — Assessment & Plan Note (Signed)
Discussed low carb diet.  Target hemoglobin A1c is 6.5 or less.  Continue current medications.  

## 2013-10-29 NOTE — Assessment & Plan Note (Signed)
Cholesterol is at goal.  Continue current dose of statin and diet Rx.  No myalgias or side effects.  F/U  LFT's in 6 months. No results found for this basename: LDLCALC             

## 2013-10-29 NOTE — Progress Notes (Signed)
Patient ID: [name], female   DOB: 03/09/1951, 63 y.o.   MRN: [id number] [name] returns today for followup. She has known coronary disease. Shehas a total right coronary artery with collaterals. She does not havecritical left-sided disease Last cath 2008 . I recall doing her catheterization in July2008. She did not have a lot of insight into her coronary disease. I took pictures for and explained to her her anatomy. Her BS have been well controlled according to her. She does not know her A1c but it was just checked by [facility]. She Denies SSCP, palpitations, or dyspnea. She has been compliant with her meds. She is active.  Was hospitalized for pancreatitis in April 2013 . 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 There were no diagnostic ST-segment changes. Perfusion imaging is consistent with breast attenuation in the anteroseptal distribution, and probable scar in the inferoposterior wall with minor lateral apical periinfarct ischemia. LVEF is 50% with basal septal hypokinesis.  No chest pain  Still eating a lot of bread     ROS: Denies fever, malais, weight loss, blurry vision, decreased visual acuity, cough, sputum, SOB, hemoptysis, pleuritic pain, palpitaitons, heartburn, abdominal pain, melena, lower extremity edema, claudication, or rash.  All other systems reviewed and negative  General: Affect appropriate Obese black female HEENT: normal Neck supple with no adenopathy JVP normal no bruits no thyromegaly Lungs clear with no wheezing and good diaphragmatic motion Heart:  S1/S2 no murmur, no rub, gallop or click PMI normal Abdomen: benighn, BS positve, no tenderness, no AAA no bruit.  No HSM or HJR Distal pulses intact with no bruits No edema Neuro non-focal Skin warm and dry No muscular weakness   Current Outpatient Prescriptions  Medication Sig Dispense  Refill  . aspirin EC 81 MG tablet Take 81 mg by mouth daily.      Marland Kitchen b complex vitamins tablet Take 1 tablet by mouth daily.      . brimonidine-timolol (COMBIGAN) 0.2-0.5 % ophthalmic solution Place 1 drop into both eyes 2 (two) times daily.       . Coenzyme Q10 (CO Q 10) 100 MG CAPS Take 1 tablet by mouth at bedtime.      Marland Kitchen doxycycline (VIBRA-TABS) 100 MG tablet Take 100 mg by mouth 2 (two) times daily.      Marland Kitchen ezetimibe-simvastatin (VYTORIN) 10-10 MG per tablet Take 1 tablet by mouth at bedtime.        . Glucosamine-Chondroit-Vit C-Mn (GLUCOSAMINE CHONDR 1500 COMPLX PO) Take 1 tablet by mouth 2 (two) times daily.      . hydrochlorothiazide 25 MG tablet Take 25 mg by mouth daily.        Marland Kitchen HYDROmorphone (DILAUDID) 4 MG tablet Take 4 mg by mouth every 4 (four) hours as needed for severe pain.      Marland Kitchen insulin aspart protamine-insulin aspart (NOVOLOG 70/30) (70-30) 100 UNIT/ML injection Inject 20 Units into the skin 2 (two) times daily with a meal.       . insulin glargine (LANTUS) 100 UNIT/ML injection Inject 40 Units into the skin at bedtime.       . isosorbide mononitrate (IMDUR) 30 MG 24 hr tablet Take 30 mg by mouth daily.        . metoprolol (TOPROL-XL) 100 MG 24 hr tablet Take 100 mg by mouth daily.        . Multiple Vitamin (MULTIVITAMIN) tablet  Take 1 tablet by mouth daily.       . nitroGLYCERIN (NITROSTAT) 0.4 MG SL tablet Place 0.4 mg under the tongue every 5 (five) minutes as needed for chest pain.      Marland Kitchen oxymetazoline (AFRIN) 0.05 % nasal spray Place 2 sprays into the nose 2 (two) times daily.      . ramipril (ALTACE) 10 MG capsule Take 10 mg by mouth daily.        Marland Kitchen senna (SENOKOT) 8.6 MG tablet Take 1 tablet by mouth as needed for constipation.       Marland Kitchen Spirulina 500 MG TABS Take 500 mg by mouth 3 (three) times daily.      . vitamin B-12 (CYANOCOBALAMIN) 1000 MCG tablet Take 1,000 mcg by mouth daily.       No current facility-administered medications for this visit.     Allergies  Other; Propine; Travatan; and Xalatan  Electrocardiogram:  SR old IMI poor R wave progression 2014 today no change SR rate 85 Old IMI poor R wave progression minor lateral T wave changes   Assessment and Plan

## 2013-10-29 NOTE — Assessment & Plan Note (Signed)
Well controlled.  Continue current medications and low sodium Dash type diet.    

## 2013-10-29 NOTE — Patient Instructions (Signed)
Your physician wants you to follow-up in: 1 year You will receive a reminder letter in the mail two months in advance. If you don't receive a letter, please call our office to schedule the follow-up appointment.    Your physician recommends that you continue on your current medications as directed. Please refer to the Current Medication list given to you today.     Thank you for choosing [facility] !  

## 2014-05-20 ENCOUNTER — Encounter (HOSPITAL_COMMUNITY): Payer: Self-pay | Admitting: Ophthalmology

## 2014-07-05 ENCOUNTER — Other Ambulatory Visit: Payer: Self-pay | Admitting: Orthopedic Surgery

## 2014-07-05 DIAGNOSIS — M25512 Pain in left shoulder: Secondary | ICD-10-CM

## 2014-07-16 ENCOUNTER — Other Ambulatory Visit: Payer: Self-pay | Admitting: Rehabilitation

## 2014-07-16 DIAGNOSIS — M5416 Radiculopathy, lumbar region: Secondary | ICD-10-CM

## 2014-07-22 ENCOUNTER — Ambulatory Visit
Admission: RE | Admit: 2014-07-22 | Discharge: 2014-07-22 | Disposition: A | Discharge status: Home / Self Care | Payer: No Typology Code available for payment source | Source: Ambulatory Visit | Attending: Orthopedic Surgery | Admitting: Orthopedic Surgery

## 2014-07-22 DIAGNOSIS — M25512 Pain in left shoulder: Secondary | ICD-10-CM

## 2014-08-05 ENCOUNTER — Ambulatory Visit
Admission: RE | Admit: 2014-08-05 | Discharge: 2014-08-05 | Disposition: A | Discharge status: Home / Self Care | Payer: No Typology Code available for payment source | Source: Ambulatory Visit | Attending: Rehabilitation | Admitting: Rehabilitation

## 2014-08-05 DIAGNOSIS — M5416 Radiculopathy, lumbar region: Secondary | ICD-10-CM

## 2014-08-26 ENCOUNTER — Ambulatory Visit: Payer: Medicare Other | Admitting: Physical Therapy

## 2014-09-16 ENCOUNTER — Ambulatory Visit: Payer: Medicare Other | Attending: Family Medicine | Admitting: Physical Therapy

## 2014-09-16 DIAGNOSIS — M25612 Stiffness of left shoulder, not elsewhere classified: Secondary | ICD-10-CM | POA: Insufficient documentation

## 2014-09-16 DIAGNOSIS — M545 Low back pain: Secondary | ICD-10-CM

## 2014-09-16 DIAGNOSIS — M25512 Pain in left shoulder: Secondary | ICD-10-CM | POA: Insufficient documentation

## 2014-09-16 NOTE — Therapy (Signed)
[facility] [place], [place], [address] Phone: [phone]   Fax:  [phone]  Physical Therapy Evaluation  Patient Details  Name: [name] MRN: [id number] Date of Birth: June 14, 1950 Referring Provider:  Lucia Gaskins, MD  Encounter Date: 09/16/2014      PT End of Session - 09/16/14 1418    Visit Number 1   Number of Visits 12   Date for PT Re-Evaluation 11/11/14   PT Start Time 0145   PT Stop Time 0231   PT Time Calculation (min) 46 min   Activity Tolerance Patient tolerated treatment well   Behavior During Therapy [facility] for tasks assessed/performed      Past Medical History  Diagnosis Date  . CAD (coronary artery disease)     Cath 2008 total RCA with collaterals  . HTN (hypertension), benign   . Hyperlipidemia   . Diabetes mellitus   . Glaucoma   . Myocardial infarction 2000, 2007  . Shortness of breath   . Pneumonia 1990  . Chronic kidney disease 08/2011    kidney cancer  . Seizures 40 years ago    pre eclampsia  . Neuromuscular disorder     neck & bilateral hands  . Cancer     kidney  . Arthritis     Past Surgical History  Procedure Laterality Date  . Neck surgery    . Rotator cuff repair      right arm  . Abdominal hysterectomy    . Nacrotizing faschitis      There were no vitals filed for this visit.  Visit Diagnosis:  Left shoulder pain - Plan: PT plan of care cert/re-cert  Shoulder stiffness, left - Plan: PT plan of care cert/re-cert  Right low back pain, with sciatica presence unspecified - Plan: PT plan of care cert/re-cert      Subjective Assessment - 09/16/14 1415    Subjective Low back pain for 3 years+ and left shoulder year and a half.  Left leg weak due to necrotizing fasciitis.   Limitations Sitting;Standing;Walking   How long can you sit comfortably? 15-20 minutes.   How long can you stand comfortably? 15-20 minutes.   How long can you walk comfortably? 15-20  minutes.   Patient Stated Goals Want to be able to walk more---exercise more.   Currently in Pain? Yes   Pain Score 8    Pain Location Shoulder   Pain Orientation Left;Right   Pain Descriptors / Indicators Aching;Burning;Constant;Numbness   Pain Type Chronic pain   Pain Onset More than a month ago   Aggravating Factors  sit; stand and walking.   Pain Relieving Factors Lying with legs inclined.            [place] PT Assessment - 09/16/14 0001    Assessment   Medical Diagnosis Left shoulder and low back pain   Onset Date --  1.5 to 3 years.   Next MD Visit --  10/12/14   Precautions   Precautions None   Balance Screen   Has the patient fallen in the past 6 months No   Has the patient had a decrease in activity level because of a fear of falling?  No   Is the patient reluctant to leave their home because of a fear of falling?  Yes   Posture/Postural Control   Posture Comments Loss of lumbar lordosis.   ROM / Strength   AROM / PROM / Strength AROM;Strength   AROM   Overall  AROM Comments Left shoulder flexion=108 degrees; ER= 40 degrees and IR to left gluteal region.  Active lumbar extension= 5 degrees and flexion decreased by 50%.   Strength   Overall Strength Comments Left shoulder ER= 3+/5; Abduction= 2+/5.   Palpation   Palpation Very tender to palpation over right lumbar erector spinae musculature and right SIJ and some tenderness over left acromial ridge.   Special Tests    Special Tests Rotator Cuff Impingement;Lumbar;Sacrolliac Tests;Leg LengthTest   Rotator Cuff Impingment tests --  Positive left Hawkins-Neer test.  Drop arm mildly positive.   Sacroiliac Tests  --  Pos. right SLR. Pos right FABER.   Leg length test  --  Equal leg lengths.                   [facility] Adult PT Treatment/Exercise - Oct 04, 2014 0001    Modalities   Modalities Electrical Stimulation   Electrical Stimulation   Electrical Stimulation Location Left shoulder and right low back.    Electrical Stimulation Action 80-150HZ  x 20 minutes.   Electrical Stimulation Goals Pain                  PT Short Term Goals - 10/04/14 1421    PT SHORT TERM GOAL #1   Title Ind with initial HEP.   Time 2   Period Weeks   Status New           PT Long Term Goals - 10-04-2014 1421    PT LONG TERM GOAL #1   Title Ind wiht advanced HEP.   Time 6   Period Weeks   Status New   PT LONG TERM GOAL #2   Title Left shoulder flexion= 125 degrees.   Time 6   Period Weeks   Status New   PT LONG TERM GOAL #3   Title Left shoulder ER to 70 degrees to don/doff clothers easier.   Time 6   Period Weeks   Status New   PT LONG TERM GOAL #4   Title Perform ADL's with pain not > 3-4/10.   Time 6   Period Weeks   Status New   PT LONG TERM GOAL #5   Title Eliminate right LE symptoms.   Time 6   Period Weeks   Status New   Additional Long Term Goals   Additional Long Term Goals Yes   PT LONG TERM GOAL #6   Title Sit/stand 30 minutes with pain not > 3-4/10.               Plan - 10/04/2014 1351    Clinical Impression Statement [place] 3 years ago and low back pain wih radiation into right LE all the way to foot.  7-8/10 pain-level for left shoulder and back.  Feel numbness and tingling all long right LE.  Pain increases with walking, sitting and standing when done for prolonged periods oftime.   Pt will benefit from skilled therapeutic intervention in order to improve on the following deficits Decreased activity tolerance;Pain   Rehab Potential Good   PT Frequency 3x / week   PT Duration 4 weeks   PT Treatment/Interventions ADLs/Self Care Home Management;Moist Heat;Therapeutic activities;Patient/family education;Therapeutic exercise;Ultrasound;Neuromuscular re-education;Electrical Stimulation   PT Next Visit Plan Can try int traction at 70# x 15 minutes.  Right low back modalities.  P and AAROMto left shoulder.          G-Codes - 2014-10-04 1419    Functional Assessment  Tool Used FOTO.  Functional Limitation Mobility: Walking and moving around   Mobility: Walking and Moving Around Current Status [phone]) At least 60 percent but less than 80 percent impaired, limited or restricted   Mobility: Walking and Moving Around Goal Status [phone]) At least 20 percent but less than 40 percent impaired, limited or restricted       Problem List Patient Active Problem List   Diagnosis Date Noted  . DM (diabetes mellitus) 07/18/2011  . MIXED HYPERLIPIDEMIA 09/29/2008  . HYPERTENSION 09/29/2008  . CAD 09/29/2008    Konrad Hoak, [place] MPT 09/16/2014, 2:32 PM  [facility] [address] [place], [place], [address] Phone: [phone]   Fax:  [phone]

## 2014-09-23 ENCOUNTER — Encounter: Payer: Self-pay | Admitting: *Deleted

## 2014-09-23 ENCOUNTER — Ambulatory Visit: Payer: Medicare Other | Admitting: *Deleted

## 2014-09-23 DIAGNOSIS — M25612 Stiffness of left shoulder, not elsewhere classified: Secondary | ICD-10-CM

## 2014-09-23 DIAGNOSIS — M25512 Pain in left shoulder: Secondary | ICD-10-CM

## 2014-09-23 DIAGNOSIS — M545 Low back pain: Secondary | ICD-10-CM

## 2014-09-23 NOTE — Patient Instructions (Signed)
ROM: External / Internal Rotation - Wand   Holding wand with left hand palm up, push out from body with other hand, palm down. Keep both elbows bent. When stretch is felt, hold _5___ seconds. Repeat to other side, leading with same hand. Keep elbows bent. Repeat __10__ times per set. Do __2-3__ sets per session. Do __2__ sessions per day.  Isometric Abdominal   Lying on back with knees bent, tighten stomach by pulling navel down. Hold ____ seconds. Repeat __5__ times per set. Do __5__ sets per session. Do _3-5___ sessions per day.  http://orth.exer.us/1086   Copyright  VHI. All rights reserved.  Bent Leg Lift (Hook-Lying)   Tighten stomach and slowly raise right leg _6___ inches from floor. Keep trunk rigid. Hold _2-3___ seconds. Repeat _10___ times per set. Do _3___ sets per session. Do _2-3___ sessions per day.  http://orth.exer.us/1090   Copyright  VHI. All rights reserved.  Bridging   Slowly raise buttocks from floor, keeping stomach tight. Repeat __10__ times per set. Do _3___ sets per session. Do __2-3__ sessions per day.  http://orth.exer.us/1096   Copyright  VHI. All rights reserved.    http://orth.exer.us/748   Copyright  VHI. All rights reserved.  ROM: Flexion - Wand (Supine)   Lie on back holding wand. Raise arms over head.  Repeat __10__ times per set. Do _2-3___ sets per session. Do _2___ sessions per day.  http://orth.exer.us/928   Copyright  VHI. All rights reserved.

## 2014-09-23 NOTE — Therapy (Signed)
[facility] [place], [place], [address] Phone: [phone]   Fax:  [phone]  Physical Therapy Treatment  Patient Details  Name: [name] MRN: [id number] Date of Birth: 1950/06/02 Referring Provider:  Lucia Gaskins, MD  Encounter Date: 09/23/2014      PT End of Session - 09/23/14 1409    Visit Number 2   Number of Visits 12   Date for PT Re-Evaluation 11/11/14   PT Start Time [id number]   PT Stop Time [id number]   PT Time Calculation (min) 52 min      Past Medical History  Diagnosis Date  . CAD (coronary artery disease)     Cath 2008 total RCA with collaterals  . HTN (hypertension), benign   . Hyperlipidemia   . Diabetes mellitus   . Glaucoma   . Myocardial infarction 2000, 2007  . Shortness of breath   . Pneumonia 1990  . Chronic kidney disease 08/2011    kidney cancer  . Seizures 40 years ago    pre eclampsia  . Neuromuscular disorder     neck & bilateral hands  . Cancer     kidney  . Arthritis     Past Surgical History  Procedure Laterality Date  . Neck surgery    . Rotator cuff repair      right arm  . Abdominal hysterectomy    . Nacrotizing faschitis      There were no vitals filed for this visit.  Visit Diagnosis:  Shoulder stiffness, left  Right low back pain, with sciatica presence unspecified  Left shoulder pain      Subjective Assessment - 09/23/14 1308    Subjective Low back pain for 3 years+ and left shoulder year and a half.  Left leg weak due to necrotizing fasciitis. Sore from E-stim   Limitations Sitting;Standing;Walking   How long can you sit comfortably? 15-20 minutes.   How long can you stand comfortably? 15-20 minutes.   How long can you walk comfortably? 15-20 minutes.   Patient Stated Goals Want to be able to walk more---exercise more.   Currently in Pain? Yes   Pain Score 7    Pain Location Shoulder   Pain Orientation Left   Pain Descriptors / Indicators  Aching;Burning   Pain Type Chronic pain   Pain Onset More than a month ago   Aggravating Factors  sitting, standing, walking   Pain Relieving Factors Lying with legs inclined   Multiple Pain Sites Yes   Pain Score 6   Pain Location Back                         [facility] Adult PT Treatment/Exercise - 09/23/14 0001    Exercises   Exercises Lumbar;Shoulder   Lumbar Exercises: Supine   Ab Set 15 reps;5 seconds  Draw-in focus to activate TA   Bent Knee Raise 10 reps;3 seconds  SLOW and only 2in heel lift. Focus onTA   Shoulder Exercises: Supine   Flexion AAROM;Left;10 reps  with cane   Other Supine Exercises press up  x10, with cane   Electrical Stimulation   Electrical Stimulation Location Left shoulder and right low back.   Electrical Stimulation Action 80-150 hz x15 min in hooklying   Electrical Stimulation Goals Pain   Manual Therapy   Manual Therapy Passive ROM   Passive ROM PROM to LT shldr for elevation and ER with gentle end-range holds in supine  PT Education - 09/23/14 1408    Education provided Yes   Education Details core exs and LT shldr supine cane exs   Person(s) Educated Patient   Methods Explanation;Demonstration;Tactile cues;Verbal cues;Handout   Comprehension Verbalized understanding;Returned demonstration          PT Short Term Goals - 09/16/14 1421    PT SHORT TERM GOAL #1   Title Ind with initial HEP.   Time 2   Period Weeks   Status New           PT Long Term Goals - 09/16/14 1421    PT LONG TERM GOAL #1   Title Ind wiht advanced HEP.   Time 6   Period Weeks   Status New   PT LONG TERM GOAL #2   Title Left shoulder flexion= 125 degrees.   Time 6   Period Weeks   Status New   PT LONG TERM GOAL #3   Title Left shoulder ER to 70 degrees to don/doff clothers easier.   Time 6   Period Weeks   Status New   PT LONG TERM GOAL #4   Title Perform ADL's with pain not > 3-4/10.   Time 6   Period Weeks    Status New   PT LONG TERM GOAL #5   Title Eliminate right LE symptoms.   Time 6   Period Weeks   Status New   Additional Long Term Goals   Additional Long Term Goals Yes   PT LONG TERM GOAL #6   Title Sit/stand 30 minutes with pain not > 3-4/10.               Plan - 09/23/14 1410    Clinical Impression Statement Pt did fair with exs today and feels that she will be able to do them at home. She had good contraction of her TA today, but her LT shldr  ROM was limited by pain   PT Frequency 3x / week   PT Duration 4 weeks   PT Treatment/Interventions ADLs/Self Care Home Management;Moist Heat;Therapeutic activities;Patient/family education;Therapeutic exercise;Ultrasound;Neuromuscular re-education;Electrical Stimulation   PT Next Visit Plan Can try int traction at 70# x 15 minutes.  Right low back modalities.  P and AAROMto left shoulder.   Consulted and Agree with Plan of Care Patient        Problem List Patient Active Problem List   Diagnosis Date Noted  . DM (diabetes mellitus) 07/18/2011  . MIXED HYPERLIPIDEMIA 09/29/2008  . HYPERTENSION 09/29/2008  . CAD 09/29/2008    Kit Brubacher,CHRIS, PTA 09/23/2014, 2:15 PM  [facility] [address] [place], [place], [address] Phone: [phone]   Fax:  [phone]

## 2014-09-30 ENCOUNTER — Ambulatory Visit: Payer: Medicare Other | Admitting: *Deleted

## 2014-09-30 ENCOUNTER — Encounter: Payer: Self-pay | Admitting: *Deleted

## 2014-09-30 DIAGNOSIS — M545 Low back pain: Secondary | ICD-10-CM

## 2014-09-30 DIAGNOSIS — M25512 Pain in left shoulder: Secondary | ICD-10-CM

## 2014-09-30 DIAGNOSIS — M25612 Stiffness of left shoulder, not elsewhere classified: Secondary | ICD-10-CM

## 2014-09-30 NOTE — Therapy (Signed)
[facility] [place], [place], [address] Phone: [phone]   Fax:  [phone]  Physical Therapy Treatment  Patient Details  Name: ADALIYAH CHOUDRY MRN: [id number] Date of Birth: [birth year] Referring Provider:  Lucia Gaskins, MD  Encounter Date: 09/30/2014      PT End of Session - 09/30/14 1400    Visit Number 3   Number of Visits 12   Date for PT Re-Evaluation 11/11/14   PT Start Time 1350   PT Stop Time 1438   PT Time Calculation (min) 48 min      Past Medical History  Diagnosis Date  . CAD (coronary artery disease)     Cath 2008 total RCA with collaterals  . HTN (hypertension), benign   . Hyperlipidemia   . Diabetes mellitus   . Glaucoma   . Myocardial infarction 2000, 2007  . Shortness of breath   . Pneumonia 1990  . Chronic kidney disease 08/2011    kidney cancer  . Seizures 40 years ago    pre eclampsia  . Neuromuscular disorder     neck & bilateral hands  . Cancer     kidney  . Arthritis     Past Surgical History  Procedure Laterality Date  . Neck surgery    . Rotator cuff repair      right arm  . Abdominal hysterectomy    . Nacrotizing faschitis      There were no vitals filed for this visit.  Visit Diagnosis:  Shoulder stiffness, left  Right low back pain, with sciatica presence unspecified  Left shoulder pain      Subjective Assessment - 09/30/14 1357    Subjective Low back pain for 3 years+ and left shoulder year and a half.  Left leg weak due to necrotizing fasciitis. Did good with E-stim   Limitations Sitting;Standing;Walking   How long can you sit comfortably? 15-20 minutes.   Patient Stated Goals Want to be able to walk more---exercise more.   Currently in Pain? Yes   Pain Score 7    Pain Location Shoulder   Pain Orientation Left   Pain Descriptors / Indicators Aching   Pain Onset More than a month ago   Aggravating Factors  sitting, standing walking   Pain Relieving  Factors lying with legs elevated            [facility] PT Assessment - 09/30/14 0001    ROM / Strength   AROM / PROM / Strength PROM;AROM   PROM   Overall PROM  Deficits   Overall PROM Comments flexion 150, ER 60, IR 65 degrees                     [facility] Adult PT Treatment/Exercise - 09/30/14 0001    Lumbar Exercises: Supine   Ab Set 15 reps;5 seconds  also posterior pelvic tilt 3x10   Bent Knee Raise 10 reps;3 seconds   Shoulder Exercises: Supine   Flexion AAROM;Left;10 reps;20 reps  with cane   Shoulder Exercises: Standing   Other Standing Exercises IR stretch HBB   Modalities   Modalities Electrical Stimulation   Electrical Stimulation   Electrical Stimulation Location Left shoulder and right low back.   Electrical Stimulation Action 80-150 hz x15 mins in hooklying   Electrical Stimulation Goals Pain   Manual Therapy   Manual Therapy Passive ROM   Passive ROM PROM to LT shldr for elevation and IR/ER with gentle end-range holds in supine  PT Short Term Goals - 09/16/14 1421    PT SHORT TERM GOAL #1   Title Ind with initial HEP.   Time 2   Period Weeks   Status New           PT Long Term Goals - 09/16/14 1421    PT LONG TERM GOAL #1   Title Ind wiht advanced HEP.   Time 6   Period Weeks   Status New   PT LONG TERM GOAL #2   Title Left shoulder flexion= 125 degrees.   Time 6   Period Weeks   Status New   PT LONG TERM GOAL #3   Title Left shoulder ER to 70 degrees to don/doff clothers easier.   Time 6   Period Weeks   Status New   PT LONG TERM GOAL #4   Title Perform ADL's with pain not > 3-4/10.   Time 6   Period Weeks   Status New   PT LONG TERM GOAL #5   Title Eliminate right LE symptoms.   Time 6   Period Weeks   Status New   Additional Long Term Goals   Additional Long Term Goals Yes   PT LONG TERM GOAL #6   Title Sit/stand 30 minutes with pain not > 3-4/10.               Plan - 09/30/14 1738     Clinical Impression Statement Pt did better with exs and PROM today, but was still unable to meet any new goals due to ROM deficits in LT shldr. She did better with Core activation Exs and pelvic tilt control. Goals are ongoing    Pt will benefit from skilled therapeutic intervention in order to improve on the following deficits Decreased activity tolerance;Pain   Rehab Potential Good   PT Frequency 3x / week   PT Duration 4 weeks   PT Treatment/Interventions ADLs/Self Care Home Management;Moist Heat;Therapeutic activities;Patient/family education;Therapeutic exercise;Ultrasound;Neuromuscular re-education;Electrical Stimulation   PT Next Visit Plan Can try int traction at 70# x 15 minutes.  Right low back modalities.  P and AAROMto left shoulder.   Consulted and Agree with Plan of Care Patient        Problem List Patient Active Problem List   Diagnosis Date Noted  . DM (diabetes mellitus) 07/18/2011  . MIXED HYPERLIPIDEMIA 09/29/2008  . HYPERTENSION 09/29/2008  . CAD 09/29/2008    Aalivia Mcgraw,CHRIS, PTA 09/30/2014, 5:45 PM  [facility] [address] [place], [place], [address] Phone: [phone]   Fax:  [phone]

## 2014-10-08 ENCOUNTER — Encounter: Payer: Self-pay | Admitting: *Deleted

## 2014-10-08 ENCOUNTER — Ambulatory Visit: Payer: Medicare Other | Attending: Family Medicine | Admitting: *Deleted

## 2014-10-08 DIAGNOSIS — M545 Low back pain: Secondary | ICD-10-CM | POA: Insufficient documentation

## 2014-10-08 DIAGNOSIS — M25612 Stiffness of left shoulder, not elsewhere classified: Secondary | ICD-10-CM | POA: Insufficient documentation

## 2014-10-08 DIAGNOSIS — M25512 Pain in left shoulder: Secondary | ICD-10-CM | POA: Diagnosis present

## 2014-10-08 NOTE — Therapy (Signed)
[facility] [place], [place], [address] Phone: [phone]   Fax:  [phone]  Physical Therapy Treatment  Patient Details  Name: [name] MRN: [id number] Date of Birth: 08-14-1950 Referring Provider:  Lucia Gaskins, MD  Encounter Date: 10/08/2014      PT End of Session - 10/08/14 0815    Visit Number 4   Number of Visits 12   Date for PT Re-Evaluation 11/11/14   PT Start Time 0816   PT Stop Time 0907   PT Time Calculation (min) 51 min      Past Medical History  Diagnosis Date  . CAD (coronary artery disease)     Cath 2008 total RCA with collaterals  . HTN (hypertension), benign   . Hyperlipidemia   . Diabetes mellitus   . Glaucoma   . Myocardial infarction 2000, 2007  . Shortness of breath   . Pneumonia 1990  . Chronic kidney disease 08/2011    kidney cancer  . Seizures 40 years ago    pre eclampsia  . Neuromuscular disorder     neck & bilateral hands  . Cancer     kidney  . Arthritis     Past Surgical History  Procedure Laterality Date  . Neck surgery    . Rotator cuff repair      right arm  . Abdominal hysterectomy    . Nacrotizing faschitis      There were no vitals filed for this visit.  Visit Diagnosis:  Shoulder stiffness, left  Right low back pain, with sciatica presence unspecified  Left shoulder pain      Subjective Assessment - 10/08/14 0816    Subjective Have been hurting more lately in my shldr and my back. Have been doing the treadmill at home with incline   Limitations Sitting;Standing;Walking   How long can you sit comfortably? 15-20 minutes.   How long can you stand comfortably? 15-20 minutes.   How long can you walk comfortably? 15-20 minutes.   Patient Stated Goals Want to be able to walk more---exercise more.   Currently in Pain? Yes   Pain Score 8    Pain Location Shoulder   Pain Orientation Left   Pain Descriptors / Indicators Aching   Pain Type  Chronic pain   Pain Onset More than a month ago   Aggravating Factors  sitting, standing   Pain Relieving Factors lying with legs elevated   Multiple Pain Sites Yes   Pain Score 8   Pain Location Back                         [facility] Adult PT Treatment/Exercise - 10/08/14 0001    Exercises   Exercises Lumbar;Shoulder   Lumbar Exercises: Supine   Ab Set 15 reps;5 seconds   Bent Knee Raise 10 reps;3 seconds   Shoulder Exercises: Supine   Flexion AAROM;Left;10 reps;20 reps  with cane   Modalities   Modalities Traction;Ultrasound   Ultrasound   Ultrasound Location LT shldr   Ultrasound Parameters 1.5 w/cm2 x10 min with pt siiting   Ultrasound Goals Pain   Traction   Type of Traction Lumbar   Min (lbs) 5   Max (lbs) 70   Hold Time 99 secs   Rest Time 5 secs   Time 15 mins   Manual Therapy   Manual Therapy Myofascial release   Myofascial Release IASTM to LT shldr x 5 mins with Pt sitting  PT Short Term Goals - 10/08/14 0908    PT SHORT TERM GOAL #1   Title Ind with initial HEP.   Time 2   Period Weeks   Status Achieved           PT Long Term Goals - 09/16/14 1421    PT LONG TERM GOAL #1   Title Ind wiht advanced HEP.   Time 6   Period Weeks   Status New   PT LONG TERM GOAL #2   Title Left shoulder flexion= 125 degrees.   Time 6   Period Weeks   Status New   PT LONG TERM GOAL #3   Title Left shoulder ER to 70 degrees to don/doff clothers easier.   Time 6   Period Weeks   Status New   PT LONG TERM GOAL #4   Title Perform ADL's with pain not > 3-4/10.   Time 6   Period Weeks   Status New   PT LONG TERM GOAL #5   Title Eliminate right LE symptoms.   Time 6   Period Weeks   Status New   Additional Long Term Goals   Additional Long Term Goals Yes   PT LONG TERM GOAL #6   Title Sit/stand 30 minutes with pain not > 3-4/10.               Plan - 10/08/14 0907    Clinical Impression Statement Pt did well  with todays's RX. She had pain relief in LT shldr with [place] and STW. She also did well with Lx traction at 70#s. She has met STG #1, but others are ongoing due to pain and tightness.   Pt will benefit from skilled therapeutic intervention in order to improve on the following deficits Decreased activity tolerance;Pain   Rehab Potential Good   PT Frequency 3x / week   PT Duration 4 weeks   PT Treatment/Interventions ADLs/Self Care Home Management;Moist Heat;Therapeutic activities;Patient/family education;Therapeutic exercise;Ultrasound;Neuromuscular re-education;Electrical Stimulation   PT Next Visit Plan Can try int traction again  at 70-75# x 15 minutes.  Right low back modalities.  P and AAROMto left shoulder. [place] combo again if ok for LT shldr        Problem List Patient Active Problem List   Diagnosis Date Noted  . DM (diabetes mellitus) 07/18/2011  . MIXED HYPERLIPIDEMIA 09/29/2008  . HYPERTENSION 09/29/2008  . CAD 09/29/2008    RAMSEUR,CHRIS PTA 10/08/2014, 9:13 AM  [facility] [address] [place], [place], [address] Phone: [phone]   Fax:  [phone]

## 2014-10-15 ENCOUNTER — Ambulatory Visit: Payer: Medicare Other | Admitting: *Deleted

## 2014-10-15 ENCOUNTER — Encounter: Payer: Self-pay | Admitting: *Deleted

## 2014-10-15 DIAGNOSIS — M25612 Stiffness of left shoulder, not elsewhere classified: Secondary | ICD-10-CM

## 2014-10-15 DIAGNOSIS — M25512 Pain in left shoulder: Secondary | ICD-10-CM

## 2014-10-15 DIAGNOSIS — M545 Low back pain: Secondary | ICD-10-CM

## 2014-10-15 NOTE — Therapy (Signed)
[facility] [place], [place], [address] Phone: [phone]   Fax:  [phone]  Physical Therapy Treatment  Patient Details  Name: [name] MRN: [id number] Date of Birth: 05/27/1950 Referring Provider:  Lucia Gaskins, MD  Encounter Date: 10/15/2014      PT End of Session - 10/15/14 0906    Visit Number 5   Number of Visits 12   Date for PT Re-Evaluation 11/11/14   PT Start Time 0902   PT Stop Time 0952   PT Time Calculation (min) 50 min      Past Medical History  Diagnosis Date  . CAD (coronary artery disease)     Cath 2008 total RCA with collaterals  . HTN (hypertension), benign   . Hyperlipidemia   . Diabetes mellitus   . Glaucoma   . Myocardial infarction 2000, 2007  . Shortness of breath   . Pneumonia 1990  . Chronic kidney disease 08/2011    kidney cancer  . Seizures 40 years ago    pre eclampsia  . Neuromuscular disorder     neck & bilateral hands  . Cancer     kidney  . Arthritis     Past Surgical History  Procedure Laterality Date  . Neck surgery    . Rotator cuff repair      right arm  . Abdominal hysterectomy    . Nacrotizing faschitis      There were no vitals filed for this visit.  Visit Diagnosis:  Shoulder stiffness, left  Right low back pain, with sciatica presence unspecified  Left shoulder pain      Subjective Assessment - 10/15/14 0911    Subjective Did good after last Rx. Back felt really good. LT shldr did good also until a aggravated it at home   Limitations Sitting;Standing;Walking   How long can you sit comfortably? 15-20 minutes.   How long can you stand comfortably? 15-20 minutes.   How long can you walk comfortably? 15-20 minutes.   Patient Stated Goals Want to be able to walk more---exercise more.   Currently in Pain? Yes   Pain Score 6    Pain Orientation Left   Pain Descriptors / Indicators Aching   Pain Type Chronic pain   Pain Onset More than  a month ago   Pain Score 6   Pain Location Back                         [facility] Adult PT Treatment/Exercise - 10/15/14 0001    Ultrasound   Ultrasound Location LT shldr   Ultrasound Parameters 1.5 w/cm2 x 10 min in sitting   Ultrasound Goals Pain   Traction   Type of Traction Lumbar   Min (lbs) 5   Max (lbs) 75   Hold Time 99 secs   Rest Time 5 secs   Time 15 mins   Manual Therapy   Manual Therapy Myofascial release   Myofascial Release IASTM/ STW to LT shldr x 13 mins with Pt sitting  LT shldr flexion 120 degrees and er to 60 degrees             PT Short Term Goals - 10/08/14 0908    PT SHORT TERM GOAL #1   Title Ind with initial HEP.   Time 2   Period Weeks   Status Achieved           PT Long Term Goals - 2014-10-30 0939    PT LONG TERM GOAL #1   Time 6   Period Weeks   Status On-going   PT LONG TERM GOAL #2   Title Left shoulder flexion= 125 degrees.   Period Weeks   Status On-going   PT LONG TERM GOAL #3   Title Left shoulder ER to 70 degrees to don/doff clothers easier.   Period Weeks   Status On-going   PT LONG TERM GOAL #4   Title Perform ADL's with pain not > 3-4/10.   Time 6   Period Weeks   Status On-going   PT LONG TERM GOAL #5   Title Eliminate right LE symptoms.   Time 6   Period Weeks               Plan - 10/30/2014 [id number]    Pt will benefit from skilled therapeutic intervention in order to improve on the following deficits Decreased activity tolerance;Pain   Rehab Potential Good   PT Frequency 3x / week   PT Duration 4 weeks   PT Treatment/Interventions ADLs/Self Care Home Management;Moist Heat;Therapeutic activities;Patient/family education;Therapeutic exercise;Ultrasound;Neuromuscular re-education;Electrical Stimulation   PT Next Visit Plan Can try int traction again  at 70-75# x 15 minutes.  Right low back  modalities.  P and AAROMto left shoulder. [place] combo again if ok for LT shldr          G-Codes - 10/30/2014 [id number]    Functional Assessment Tool Used FOTO. 5th visit 54% limitation   Functional Limitation Mobility: Walking and moving around      Problem List Patient Active Problem List   Diagnosis Date Noted  . DM (diabetes mellitus) 07/18/2011  . MIXED HYPERLIPIDEMIA 09/29/2008  . HYPERTENSION 09/29/2008  . CAD 09/29/2008    [name], PTA 10/30/2014, 10:45 AM  [facility] [address] [place], [place], [address] Phone: [phone]   Fax:  [phone]

## 2014-10-22 ENCOUNTER — Ambulatory Visit: Payer: Medicare Other | Admitting: *Deleted

## 2014-10-22 ENCOUNTER — Encounter: Payer: Self-pay | Admitting: *Deleted

## 2014-10-22 DIAGNOSIS — M545 Low back pain: Secondary | ICD-10-CM

## 2014-10-22 DIAGNOSIS — M25612 Stiffness of left shoulder, not elsewhere classified: Secondary | ICD-10-CM

## 2014-10-22 DIAGNOSIS — M25512 Pain in left shoulder: Secondary | ICD-10-CM

## 2014-10-22 NOTE — Therapy (Signed)
[facility] [place], [place], [address] Phone: [phone]   Fax:  [phone]  Physical Therapy Treatment  Patient Details  Name: [name] MRN: [id number] Date of Birth: [birth year] Referring Provider:  Lucia Gaskins, MD  Encounter Date: 10/22/2014      PT End of Session - 10/22/14 1010    Visit Number 6   Number of Visits 12   Date for PT Re-Evaluation 11/11/14   PT Start Time 0945      Past Medical History  Diagnosis Date  . CAD (coronary artery disease)     Cath 2008 total RCA with collaterals  . HTN (hypertension), benign   . Hyperlipidemia   . Diabetes mellitus   . Glaucoma   . Myocardial infarction 2000, 2007  . Shortness of breath   . Pneumonia 1990  . Chronic kidney disease 08/2011    kidney cancer  . Seizures 40 years ago    pre eclampsia  . Neuromuscular disorder     neck & bilateral hands  . Cancer     kidney  . Arthritis     Past Surgical History  Procedure Laterality Date  . Neck surgery    . Rotator cuff repair      right arm  . Abdominal hysterectomy    . Nacrotizing faschitis      There were no vitals filed for this visit.  Visit Diagnosis:  Shoulder stiffness, left  Right low back pain, with sciatica presence unspecified  Left shoulder pain      Subjective Assessment - 10/22/14 0904    Subjective Did good after lastRx, but sore from Tx. Try 70 # again. . LT shldr did ok   Limitations Sitting;Standing;Walking   How long can you sit comfortably? 15-20 minutes.   How long can you stand comfortably? 15-20 minutes.   How long can you walk comfortably? 15-20 minutes.   Patient Stated Goals Want to be able to walk more---exercise more.   Currently in Pain? Yes   Pain Score 6    Pain Location Shoulder   Pain Orientation Left   Pain Descriptors / Indicators Aching   Pain Type Chronic pain   Pain Onset More than a month ago   Aggravating Factors  sitting, standing   Multiple Pain Sites Yes   Pain Score 8   Pain Location Back   Pain Descriptors / Indicators Aching;Constant            [facility] PT Assessment - 10/22/14 0001    AROM   Overall AROM Comments Left shoulder flexion=115 degrees; ER= 40 degrees and IR to left gluteal region.  Active lumbar extension= 8 degrees    PROM   Overall PROM  Deficits                     [facility] Adult PT Treatment/Exercise - 10/22/14 0001    Exercises   Exercises Lumbar;Shoulder   Shoulder Exercises: Standing   Other Standing Exercises IR stretch HBB   Other Standing Exercises RW 4 with yellow Tband all x 10-15 reps. Cues for technique needed   Traction   Type of Traction Lumbar   Min (lbs) 5   Max (lbs) 70   Hold Time 99 secs   Rest Time 5 secs   Time 15 mins                  PT Short Term Goals - 10/08/14 0908    PT  SHORT TERM GOAL #1   Title Ind with initial HEP.   Time 2   Period Weeks   Status Achieved           PT Long Term Goals - 10/15/14 0939    PT LONG TERM GOAL #1   Time 6   Period Weeks   Status On-going   PT LONG TERM GOAL #2   Title Left shoulder flexion= 125 degrees.   Period Weeks   Status On-going   PT LONG TERM GOAL #3   Title Left shoulder ER to 70 degrees to don/doff clothers easier.   Period Weeks   Status On-going   PT LONG TERM GOAL #4   Title Perform ADL's with pain not > 3-4/10.   Time 6   Period Weeks   Status On-going   PT LONG TERM GOAL #5   Title Eliminate right LE symptoms.   Time 6   Period Weeks               Plan - 10/22/14 1002    Clinical Impression Statement Pt did well with RW 4 exs today and denied [place] for LT shldr and just wanted traction for LB. No new goals met today due to high pain levels.    Pt will benefit from skilled therapeutic intervention in order to improve on the following deficits Decreased activity tolerance;Pain   Rehab Potential Good   PT Frequency 3x / week   PT Duration 4 weeks   PT Next  Visit Plan Can try int traction again  at 70# x 15 minutes.  Right low back modalities.  P and AAROM to left shoulder. [place] combo again if ok for LT shldr. Review RW 4        Problem List Patient Active Problem List   Diagnosis Date Noted  . DM (diabetes mellitus) 07/18/2011  . MIXED HYPERLIPIDEMIA 09/29/2008  . HYPERTENSION 09/29/2008  . CAD 09/29/2008    Yasira Engelson,CHRIS, PTA 10/22/2014, 10:12 AM  [facility] [address] [place], [place], [address] Phone: [phone]   Fax:  [phone]

## 2014-10-27 ENCOUNTER — Ambulatory Visit: Payer: Medicare Other | Admitting: *Deleted

## 2014-10-27 ENCOUNTER — Encounter: Payer: Self-pay | Admitting: *Deleted

## 2014-10-27 DIAGNOSIS — M25612 Stiffness of left shoulder, not elsewhere classified: Secondary | ICD-10-CM

## 2014-10-27 DIAGNOSIS — M545 Low back pain: Secondary | ICD-10-CM

## 2014-10-27 DIAGNOSIS — M25512 Pain in left shoulder: Secondary | ICD-10-CM

## 2014-10-27 NOTE — Therapy (Signed)
[facility] [place], [place], [address] Phone: [phone]   Fax:  [phone]  Physical Therapy Treatment  Patient Details  Name: [name] MRN: [id number] Date of Birth: 03-03-1951 Referring Provider:  Lucia Gaskins, MD  Encounter Date: 10/27/2014      PT End of Session - 10/27/14 0904    Visit Number 7   Number of Visits 12   Date for PT Re-Evaluation 11/11/14   PT Start Time 0901   PT Stop Time 0950   PT Time Calculation (min) 49 min      Past Medical History  Diagnosis Date  . CAD (coronary artery disease)     Cath 2008 total RCA with collaterals  . HTN (hypertension), benign   . Hyperlipidemia   . Diabetes mellitus   . Glaucoma   . Myocardial infarction 2000, 2007  . Shortness of breath   . Pneumonia 1990  . Chronic kidney disease 08/2011    kidney cancer  . Seizures 40 years ago    pre eclampsia  . Neuromuscular disorder     neck & bilateral hands  . Cancer     kidney  . Arthritis     Past Surgical History  Procedure Laterality Date  . Neck surgery    . Rotator cuff repair      right arm  . Abdominal hysterectomy    . Nacrotizing faschitis      There were no vitals filed for this visit.  Visit Diagnosis:  Shoulder stiffness, left  Right low back pain, with sciatica presence unspecified  Left shoulder pain      Subjective Assessment - 10/27/14 0906    Subjective Need to go over exercises again.for LT shldr.  LT shldr still hurts    Limitations Sitting;Standing;Walking   How long can you sit comfortably? 15-20 minutes.   How long can you stand comfortably? 15-20 minutes.   Patient Stated Goals Want to be able to walk more---exercise more.   Currently in Pain? Yes   Pain Score 5    Pain Location Shoulder   Pain Orientation Left   Pain Descriptors / Indicators Aching   Pain Type Chronic pain   Pain Onset More than a month ago   Aggravating Factors  sitting and standing    Pain Relieving Factors lying with legs elevated   Multiple Pain Sites Yes   Pain Score 7   Pain Location Back   Pain Orientation Right   Pain Descriptors / Indicators Aching;Constant            [facility] PT Assessment - 10/27/14 0001    PROM   Overall PROM  Deficits   Overall PROM Comments flexion 140, ER 60, IR 65 degrees                     [facility] Adult PT Treatment/Exercise - 10/27/14 0001    Exercises   Exercises Lumbar;Shoulder   Shoulder Exercises: Standing   Other Standing Exercises RW 4 with yellow Tband all 3 x 10 reps. Cues for technique needed   Ultrasound   Ultrasound Location LT shldr   Ultrasound Parameters 1.5 w/cm2   Ultrasound Goals Pain   Traction   Type of Traction Lumbar   Min (lbs) 5   Max (lbs) 70   Hold Time 99 secs   Rest Time 5 secs   Time 15 mins   Manual Therapy   Manual Therapy Passive ROM   Passive ROM  PROM to LT shldr for elevation and IR/ER with gentle end-range holds in supine                  PT Short Term Goals - 10/08/14 0908    PT SHORT TERM GOAL #1   Title Ind with initial HEP.   Time 2   Period Weeks   Status Achieved           PT Long Term Goals - 10/27/14 1016    PT LONG TERM GOAL #1   Title Ind wiht advanced HEP.   Time 6   Period Weeks   Status On-going   PT LONG TERM GOAL #2   Title Left shoulder flexion= 125 degrees.   Time 6   Period Weeks   Status Achieved   PT LONG TERM GOAL #3   Title Left shoulder ER to 70 degrees to don/doff clothers easier.   Time 6   Period Weeks   Status On-going   PT LONG TERM GOAL #4   Title Perform ADL's with pain not > 3-4/10.   PT LONG TERM GOAL #6   Title Sit/stand 30 minutes with pain not > 3-4/10.   Status On-going               Plan - 10/27/14 1011    Clinical Impression Statement Pt did better with Rx today and feels that the LT shldr was looser and had less pain after RX. She did well again with Lumbar Tx at 70#s. She ewas able to  meet LTG for SHLDR ROM to 125 degrees of flexion. Others are ongoing   Pt will benefit from skilled therapeutic intervention in order to improve on the following deficits Decreased activity tolerance;Pain   Rehab Potential Good   PT Frequency 3x / week   PT Duration 4 weeks   PT Treatment/Interventions ADLs/Self Care Home Management;Moist Heat;Therapeutic activities;Patient/family education;Therapeutic exercise;Ultrasound;Neuromuscular re-education;Electrical Stimulation   PT Next Visit Plan Can try int traction again  at 70# x 15 minutes.  Right low back modalities.  P and AAROM to left shoulder. [place] combo again if ok for LT shldr. Review RW 4        Problem List Patient Active Problem List   Diagnosis Date Noted  . DM (diabetes mellitus) 07/18/2011  . MIXED HYPERLIPIDEMIA 09/29/2008  . HYPERTENSION 09/29/2008  . CAD 09/29/2008    Niala Stcharles,CHRIS, PTA 10/27/2014, 10:40 AM  [facility] [address] [place], [place], [address] Phone: [phone]   Fax:  [phone]

## 2014-11-10 ENCOUNTER — Encounter: Payer: Self-pay | Admitting: *Deleted

## 2014-11-10 ENCOUNTER — Ambulatory Visit: Payer: Medicare Other | Attending: Family Medicine | Admitting: *Deleted

## 2014-11-10 DIAGNOSIS — M25612 Stiffness of left shoulder, not elsewhere classified: Secondary | ICD-10-CM | POA: Insufficient documentation

## 2014-11-10 DIAGNOSIS — M25512 Pain in left shoulder: Secondary | ICD-10-CM | POA: Diagnosis present

## 2014-11-10 DIAGNOSIS — M545 Low back pain: Secondary | ICD-10-CM | POA: Insufficient documentation

## 2014-11-10 NOTE — Therapy (Addendum)
[facility] [place], [place], [address] Phone: [phone]   Fax:  [phone]  Physical Therapy Treatment  Patient Details  Name: [name] MRN: [id number] Date of Birth: 08/19/1950 Referring Provider:  Lucia Gaskins, MD  Encounter Date: 11/10/2014    Past Medical History:  Diagnosis Date  . Arthritis   . CAD (coronary artery disease)    Cath 2008 total RCA with collaterals  . Cancer ([place])    kidney  . Chronic kidney disease 08/2011   kidney cancer  . Diabetes mellitus   . Glaucoma   . HTN (hypertension), benign   . Hyperlipidemia   . Myocardial infarction 2000, 2007  . Neuromuscular disorder ([place])    neck & bilateral hands  . Pneumonia 1990  . Seizures ([place]) 40 years ago   pre eclampsia  . Shortness of breath     Past Surgical History:  Procedure Laterality Date  . ABDOMINAL HYSTERECTOMY    . nacrotizing faschitis    . NECK SURGERY    . ROTATOR CUFF REPAIR     right arm    There were no vitals filed for this visit.  Visit Diagnosis:  Shoulder stiffness, left - Plan: PT plan of care cert/re-cert  Right low back pain, with sciatica presence unspecified - Plan: PT plan of care cert/re-cert  Left shoulder pain - Plan: PT plan of care cert/re-cert      Pt feels that her back is 25% better overall. No Le symptoms in several days now                               PT Short Term Goals - 11/10/14 0932      PT SHORT TERM GOAL #1   Title Ind with initial HEP.   Time 2   Period Weeks   Status Achieved           PT Long Term Goals - 11/10/14 3557      PT LONG TERM GOAL #1   Title Ind wiht advanced HEP.   Time 6   Period Weeks   Status On-going     PT LONG TERM GOAL #2   Title Left shoulder flexion= 125 degrees.   Time 6   Period Weeks   Status Achieved     PT LONG TERM GOAL #3   Title Left shoulder ER to 70 degrees to don/doff clothers easier.   Time 6    Period Weeks   Status On-going  NM yet 55 degrees today     PT LONG TERM GOAL #4   Title Perform ADL's with pain not > 3-4/10.   Time 6   Period Weeks   Status On-going     PT LONG TERM GOAL #5   Title Eliminate right LE symptoms.   Time 6   Period Weeks   Status On-going  Pt has made progress toward goal. No RT LE symptoms in several days               Problem List Patient Active Problem List   Diagnosis Date Noted  . DM (diabetes mellitus) ([place]) 07/18/2011  . MIXED HYPERLIPIDEMIA 09/29/2008  . HYPERTENSION 09/29/2008  . CAD 09/29/2008    APPLEGATE, Mali, PTA 03/05/2016, 6:32 PM  [facility] [address] [place], [place], [address] Phone: [phone]   Fax:  [phone]  PHYSICAL THERAPY DISCHARGE SUMMARY  Visits from  Start of Care: 8.  Current functional level related to goals / functional outcomes: Please see above.   Remaining deficits: Continued loss of left shoulder ER and continued pain.  Education / Equipment: HEP. Plan: Patient agrees to discharge.  Patient goals were partially met. Patient is being discharged due to not returning since the last visit.  ?????         Mali Applegate MPT

## 2015-04-04 ENCOUNTER — Other Ambulatory Visit (HOSPITAL_COMMUNITY): Payer: Self-pay | Admitting: Family Medicine

## 2015-04-04 DIAGNOSIS — Z1231 Encounter for screening mammogram for malignant neoplasm of breast: Secondary | ICD-10-CM

## 2015-05-11 NOTE — Progress Notes (Signed)
Patient ID: [name], female   DOB: 07-04-1950, 65 y.o.   MRN: [id number]   [name] returns today for followup. She has known coronary disease. She has a total right coronary artery with collaterals. She does not have critical left-sided disease Last cath 2008 .Marland Kitchen Her BS have been well controlled according to her. She does not know her A1c but it was just checked by [facility]. She Denies SSCP, palpitations, or dyspnea. She has been compliant with her meds. She is active.   Was hospitalized for pancreatitis in April 2013 . CT showed left renal mass 3.7cm Had left nephrectomy for this   Last myovue: 6/13 low risk  IMPRESSION: Abnormal, but relatively low risk Lexiscan Myoview. There were no diagnostic ST-segment changes. Perfusion imaging is consistent with breast attenuation in the anteroseptal distribution, and probable scar in the inferoposterior wall with minor lateral apical periinfarct ischemia. LVEF is 50% with basal septal hypokinesis.  No chest pain  Still eating a lot of bread/potatoes but has gone down on lantus dose    ROS: Denies fever, malais, weight loss, blurry vision, decreased visual acuity, cough, sputum, SOB, hemoptysis, pleuritic pain, palpitaitons, heartburn, abdominal pain, melena, lower extremity edema, claudication, or rash.  All other systems reviewed and negative  General: Affect appropriate Obese black female HEENT: normal Neck supple with no adenopathy JVP normal no bruits no thyromegaly Lungs clear with no wheezing and good diaphragmatic motion Heart:  S1/S2 no murmur, no rub, gallop or click PMI normal Abdomen: benighn, BS positve, no tenderness, no AAA no bruit.  No HSM or HJR Distal pulses intact with no bruits No edema Neuro non-focal Skin warm and dry No muscular weakness   Current Outpatient Prescriptions  Medication Sig Dispense Refill  . aspirin EC 81 MG tablet Take 81 mg by mouth daily.    . brimonidine-timolol (COMBIGAN) 0.2-0.5 %  ophthalmic solution Place 1 drop into both eyes 2 (two) times daily.     . Coenzyme Q10 (CO Q 10) 100 MG CAPS Take 1 tablet by mouth at bedtime.    Marland Kitchen ezetimibe-simvastatin (VYTORIN) 10-10 MG per tablet Take 1 tablet by mouth at bedtime.      . Glucosamine-Chondroit-Vit C-Mn (GLUCOSAMINE CHONDR 1500 COMPLX PO) Take 1 tablet by mouth 2 (two) times daily.    . hydrochlorothiazide 25 MG tablet Take 25 mg by mouth daily.      Marland Kitchen HYDROmorphone (DILAUDID) 4 MG tablet Take 4 mg by mouth every 4 (four) hours as needed for severe pain.    Marland Kitchen insulin aspart protamine-insulin aspart (NOVOLOG 70/30) (70-30) 100 UNIT/ML injection Inject 20 Units into the skin 2 (two) times daily with a meal.     . insulin glargine (LANTUS) 100 UNIT/ML injection Inject 30 Units into the skin at bedtime.     . isosorbide mononitrate (IMDUR) 30 MG 24 hr tablet Take 30 mg by mouth daily.      . metoprolol (TOPROL-XL) 100 MG 24 hr tablet Take 100 mg by mouth daily.      . Multiple Vitamin (MULTIVITAMIN) tablet Take 1 tablet by mouth daily.     . nitroGLYCERIN (NITROSTAT) 0.4 MG SL tablet Place 0.4 mg under the tongue every 5 (five) minutes as needed for chest pain.    Marland Kitchen oxymetazoline (AFRIN) 0.05 % nasal spray Place 2 sprays into the nose 2 (two) times daily.    . ramipril (ALTACE) 10 MG capsule Take 10 mg by mouth daily.      Marland Kitchen senna (SENOKOT)  8.6 MG tablet Take 1 tablet by mouth as needed for constipation.     Marland Kitchen Spirulina 500 MG TABS Take 500 mg by mouth 3 (three) times daily.     No current facility-administered medications for this visit.    Allergies  Insect extract; Other; Propine; Travatan; and Xalatan  Electrocardiogram:  SR old IMI poor R wave progression 2014 today no change SR rate 85 Old IMI poor R wave progression minor lateral T wave changes  SR rate 74 T wave inversion 3,F poor R wave progression no acute changes  Assessment and Plan  CAD:  Total RCA with collaterals  Low risk myovue 2013  Consider f/u myovue  in a year DM:  Discussed low carb diet.  Target hemoglobin A1c is 6.5 or less.  Continue current medications. Chol:  Cholesterol is at goal.  Continue current dose of statin and diet Rx.  No myalgias or side effects.  F/U  LFT's in 6 months. No results found for: [facility]          Jenkins Rouge

## 2015-05-12 ENCOUNTER — Ambulatory Visit (INDEPENDENT_AMBULATORY_CARE_PROVIDER_SITE_OTHER): Payer: Medicare Other | Admitting: Cardiovascular Disease

## 2015-05-12 ENCOUNTER — Ambulatory Visit (HOSPITAL_COMMUNITY)
Admission: RE | Admit: 2015-05-12 | Discharge: 2015-05-12 | Disposition: A | Discharge status: Home / Self Care | Payer: Medicare Other | Source: Ambulatory Visit | Attending: Family Medicine | Admitting: Family Medicine

## 2015-05-12 ENCOUNTER — Encounter: Payer: Self-pay | Admitting: Cardiovascular Disease

## 2015-05-12 VITALS — BP 122/64 | HR 74 | Ht 59.5 in | Wt 169.0 lb

## 2015-05-12 DIAGNOSIS — I10 Essential (primary) hypertension: Secondary | ICD-10-CM

## 2015-05-12 DIAGNOSIS — Z1231 Encounter for screening mammogram for malignant neoplasm of breast: Secondary | ICD-10-CM | POA: Insufficient documentation

## 2015-05-12 NOTE — Patient Instructions (Signed)
Your physician wants you to follow-up in: 1 Year with Dr. Nishan. You will receive a reminder letter in the mail two months in advance. If you don't receive a letter, please call our office to schedule the follow-up appointment.  Your physician recommends that you continue on your current medications as directed. Please refer to the Current Medication list given to you today.  If you need a refill on your cardiac medications before your next appointment, please call your pharmacy.  Thank you for choosing [facility]!   

## 2015-07-12 ENCOUNTER — Other Ambulatory Visit (HOSPITAL_COMMUNITY): Payer: Self-pay | Admitting: Family Medicine

## 2015-07-12 DIAGNOSIS — R52 Pain, unspecified: Secondary | ICD-10-CM

## 2015-07-18 ENCOUNTER — Ambulatory Visit (HOSPITAL_COMMUNITY)
Admission: RE | Admit: 2015-07-18 | Discharge: 2015-07-18 | Disposition: A | Discharge status: Home / Self Care | Payer: Medicare Other | Source: Ambulatory Visit | Attending: Family Medicine | Admitting: Family Medicine

## 2015-07-18 DIAGNOSIS — I2510 Atherosclerotic heart disease of native coronary artery without angina pectoris: Secondary | ICD-10-CM | POA: Diagnosis not present

## 2015-07-18 DIAGNOSIS — N289 Disorder of kidney and ureter, unspecified: Secondary | ICD-10-CM | POA: Insufficient documentation

## 2015-07-18 DIAGNOSIS — D3502 Benign neoplasm of left adrenal gland: Secondary | ICD-10-CM | POA: Diagnosis not present

## 2015-07-18 DIAGNOSIS — G8929 Other chronic pain: Secondary | ICD-10-CM | POA: Insufficient documentation

## 2015-07-18 DIAGNOSIS — R52 Pain, unspecified: Secondary | ICD-10-CM

## 2015-07-18 DIAGNOSIS — R1011 Right upper quadrant pain: Secondary | ICD-10-CM | POA: Insufficient documentation

## 2015-08-09 ENCOUNTER — Other Ambulatory Visit (HOSPITAL_COMMUNITY): Payer: Self-pay | Admitting: Family Medicine

## 2015-08-09 DIAGNOSIS — R52 Pain, unspecified: Secondary | ICD-10-CM

## 2015-08-17 ENCOUNTER — Ambulatory Visit (HOSPITAL_COMMUNITY)
Admission: RE | Admit: 2015-08-17 | Discharge: 2015-08-17 | Disposition: A | Discharge status: Home / Self Care | Payer: Medicare Other | Source: Ambulatory Visit | Attending: Family Medicine | Admitting: Family Medicine

## 2015-08-17 ENCOUNTER — Other Ambulatory Visit (HOSPITAL_COMMUNITY): Payer: Self-pay | Admitting: Family Medicine

## 2015-08-17 DIAGNOSIS — R52 Pain, unspecified: Secondary | ICD-10-CM

## 2015-08-17 DIAGNOSIS — N289 Disorder of kidney and ureter, unspecified: Secondary | ICD-10-CM | POA: Diagnosis not present

## 2015-08-17 DIAGNOSIS — R109 Unspecified abdominal pain: Secondary | ICD-10-CM | POA: Diagnosis not present

## 2015-09-07 ENCOUNTER — Ambulatory Visit: Payer: Medicare Other | Admitting: Orthopaedic Surgery

## 2015-09-13 ENCOUNTER — Ambulatory Visit: Payer: Medicare Other | Admitting: Orthopaedic Surgery

## 2015-09-14 ENCOUNTER — Ambulatory Visit: Payer: Medicare Other | Admitting: Orthopaedic Surgery

## 2016-09-11 ENCOUNTER — Other Ambulatory Visit (HOSPITAL_COMMUNITY): Payer: Self-pay | Admitting: Family Medicine

## 2016-09-11 DIAGNOSIS — R1031 Right lower quadrant pain: Secondary | ICD-10-CM

## 2016-09-14 ENCOUNTER — Ambulatory Visit (HOSPITAL_COMMUNITY)
Admission: RE | Admit: 2016-09-14 | Discharge: 2016-09-14 | Disposition: A | Discharge status: Home / Self Care | Payer: Medicare Other | Source: Ambulatory Visit | Attending: Family Medicine | Admitting: Family Medicine

## 2016-09-14 ENCOUNTER — Other Ambulatory Visit (HOSPITAL_COMMUNITY): Payer: Self-pay | Admitting: Family Medicine

## 2016-09-14 DIAGNOSIS — R1031 Right lower quadrant pain: Secondary | ICD-10-CM | POA: Insufficient documentation

## 2016-09-14 DIAGNOSIS — D3502 Benign neoplasm of left adrenal gland: Secondary | ICD-10-CM | POA: Insufficient documentation

## 2016-09-14 DIAGNOSIS — N281 Cyst of kidney, acquired: Secondary | ICD-10-CM | POA: Diagnosis not present

## 2016-09-14 DIAGNOSIS — Z1231 Encounter for screening mammogram for malignant neoplasm of breast: Secondary | ICD-10-CM

## 2016-09-25 ENCOUNTER — Other Ambulatory Visit (HOSPITAL_COMMUNITY): Payer: Self-pay | Admitting: Family Medicine

## 2016-09-25 DIAGNOSIS — Z780 Asymptomatic menopausal state: Secondary | ICD-10-CM

## 2016-10-21 NOTE — Progress Notes (Signed)
Patient ID: [name], female   DOB: Sep 01, 1950, 66 y.o.   MRN: [id number]   [name] returns today for followup. She has known coronary disease. She has a total right coronary artery with collaterals. She does not have critical left-sided disease Last cath 2008 .Marland Kitchen Her BS have been well controlled according to her. She does not know her A1c but it was just checked by [name]. She Denies SSCP, palpitations, or dyspnea. She has been compliant with her meds. She is active.   Was hospitalized for pancreatitis in April 2013 . CT showed left renal mass 3.7cm Had left nephrectomy for this   Last myovue: 6/13 low risk  IMPRESSION: Abnormal, but relatively low risk Lexiscan Myoview. There were no diagnostic ST-segment changes. Perfusion imaging is consistent with breast attenuation in the anteroseptal distribution, and probable scar in the inferoposterior wall with minor lateral apical periinfarct ischemia. LVEF is 50% with basal septal hypokinesis.  No chest pain  Still eating a lot of bread/potatoes but has gone down on lantus dose  Ambulation limited by chronic right hip pian    ROS: Denies fever, malais, weight loss, blurry vision, decreased visual acuity, cough, sputum, SOB, hemoptysis, pleuritic pain, palpitaitons, heartburn, abdominal pain, melena, lower extremity edema, claudication, or rash.  All other systems reviewed and negative  General: BP 124/70   Pulse 89   Ht 4' 11.5" (1.511 m)   Wt 82.1 kg (181 lb)   SpO2 91%   BMI 35.95 kg/m  Affect appropriate Healthy:  appears stated age 54: normal Neck supple with no adenopathy JVP normal no bruits no thyromegaly Lungs clear with no wheezing and good diaphragmatic motion Heart:  S1/S2 no murmur, no rub, gallop or click PMI normal Abdomen: benighn, BS positve, no tenderness, no AAA no bruit.  No HSM or HJR Distal pulses intact with no bruits No edema Neuro non-focal Skin warm and dry No muscular weakness    Current  Outpatient Prescriptions  Medication Sig Dispense Refill  . aspirin EC 81 MG tablet Take 81 mg by mouth daily.    . Coenzyme Q10 (CO Q 10) 100 MG CAPS Take 1 tablet by mouth at bedtime.    Marland Kitchen ezetimibe-simvastatin (VYTORIN) 10-10 MG per tablet Take 1 tablet by mouth at bedtime.      . Glucosamine-Chondroit-Vit C-Mn (GLUCOSAMINE CHONDR 1500 COMPLX PO) Take 1 tablet by mouth 2 (two) times daily.    . hydrochlorothiazide 25 MG tablet Take 25 mg by mouth daily.      Marland Kitchen HYDROmorphone (DILAUDID) 4 MG tablet Take 4 mg by mouth every 4 (four) hours as needed for severe pain.    Marland Kitchen insulin aspart protamine-insulin aspart (NOVOLOG 70/30) (70-30) 100 UNIT/ML injection Inject 25 Units into the skin 2 (two) times daily with a meal.     . insulin glargine (LANTUS) 100 UNIT/ML injection Inject 50 Units into the skin at bedtime.     . isosorbide mononitrate (IMDUR) 30 MG 24 hr tablet Take 30 mg by mouth daily.      . metoprolol (TOPROL-XL) 100 MG 24 hr tablet Take 100 mg by mouth daily.      . Multiple Vitamin (MULTIVITAMIN) tablet Take 1 tablet by mouth daily.     . nitroGLYCERIN (NITROSTAT) 0.4 MG SL tablet Place 0.4 mg under the tongue every 5 (five) minutes as needed for chest pain.    Marland Kitchen oxymetazoline (AFRIN) 0.05 % nasal spray Place 2 sprays into the nose 2 (two) times daily.    Marland Kitchen  ramipril (ALTACE) 10 MG capsule Take 10 mg by mouth daily.      Marland Kitchen senna (SENOKOT) 8.6 MG tablet Take 1 tablet by mouth as needed for constipation.     Marland Kitchen Spirulina 500 MG TABS Take 500 mg by mouth 3 (three) times daily.     No current facility-administered medications for this visit.    Facility-Administered Medications Ordered in Other Visits  Medication Dose Route Frequency Provider Last Rate Last Dose  . sodium chloride 0.9 % infusion             Allergies  Insect extract allergy skin test; Other; Propine [dipivefrin]; Travatan [travoprost]; and Xalatan [latanoprost]  Electrocardiogram:  SR old IMI poor R wave progression  2014 today no change SR rate 85 Old IMI poor R wave progression minor lateral T wave changes  SR rate 74 T wave inversion 3,F poor R wave progression no acute changes 10/22/16  SR rate 51 old IMI poor R wave progression   Assessment and Plan  CAD:  Total RCA with collaterals  Low risk myovue 2013  Fu myovue ito asses Ischemic burden especially given DM Has been 5 years since functional study DM:  Discussed low carb diet.  Target hemoglobin A1c is 6.5 or less.  Continue current medications. Chol:  Cholesterol is at goal.  Continue current dose of statin and diet Rx.  No myalgias or side effects.  F/U  LFT's in 6 months. No results found for: [facility] Renal: post left nephrectomy baseline now 2.23 suggest primary refer to nephrology avoid contrast/cath if possible Bruit: new finding right fu duplex to r/o hemodynamically significant stenosis         Ordered:  myovue and carotid duplex    [name]

## 2016-10-22 ENCOUNTER — Ambulatory Visit (INDEPENDENT_AMBULATORY_CARE_PROVIDER_SITE_OTHER): Payer: Medicare Other | Admitting: Cardiovascular Disease

## 2016-10-22 ENCOUNTER — Ambulatory Visit (HOSPITAL_COMMUNITY)
Admission: RE | Admit: 2016-10-22 | Discharge: 2016-10-22 | Disposition: A | Discharge status: Home / Self Care | Payer: Medicare Other | Source: Ambulatory Visit | Attending: Family Medicine | Admitting: Family Medicine

## 2016-10-22 ENCOUNTER — Encounter: Payer: Self-pay | Admitting: Cardiovascular Disease

## 2016-10-22 VITALS — BP 124/70 | HR 89 | Ht 59.5 in | Wt 181.0 lb

## 2016-10-22 DIAGNOSIS — Z780 Asymptomatic menopausal state: Secondary | ICD-10-CM | POA: Insufficient documentation

## 2016-10-22 DIAGNOSIS — Z1231 Encounter for screening mammogram for malignant neoplasm of breast: Secondary | ICD-10-CM | POA: Insufficient documentation

## 2016-10-22 DIAGNOSIS — Z794 Long term (current) use of insulin: Secondary | ICD-10-CM | POA: Diagnosis not present

## 2016-10-22 DIAGNOSIS — E119 Type 2 diabetes mellitus without complications: Secondary | ICD-10-CM | POA: Diagnosis not present

## 2016-10-22 DIAGNOSIS — M85852 Other specified disorders of bone density and structure, left thigh: Secondary | ICD-10-CM | POA: Diagnosis not present

## 2016-10-22 DIAGNOSIS — R0989 Other specified symptoms and signs involving the circulatory and respiratory systems: Secondary | ICD-10-CM | POA: Diagnosis not present

## 2016-10-22 DIAGNOSIS — I10 Essential (primary) hypertension: Secondary | ICD-10-CM | POA: Diagnosis not present

## 2016-10-22 DIAGNOSIS — R079 Chest pain, unspecified: Secondary | ICD-10-CM | POA: Diagnosis not present

## 2016-10-22 MED ORDER — SODIUM CHLORIDE 0.9 % IV SOLN
INTRAVENOUS | Status: AC
  Filled 2016-10-22: qty 250

## 2016-10-22 NOTE — Patient Instructions (Signed)
Medication Instructions:  Your physician recommends that you continue on your current medications as directed. Please refer to the Current Medication list given to you today.   Labwork: NONE  Testing/Procedures: Your physician has requested that you have a lexiscan myoview. For further information please visit [email or web address]. Please follow instruction sheet, as given.  Your physician has requested that you have a carotid duplex. This test is an ultrasound of the carotid arteries in your neck. It looks at blood flow through these arteries that supply the brain with blood. Allow one hour for this exam. There are no restrictions or special instructions.    Follow-Up: Your physician wants you to follow-up in: 1 YEAR.  You will receive a reminder letter in the mail two months in advance. If you don't receive a letter, please call our office to schedule the follow-up appointment.   Any Other Special Instructions Will Be Listed Below (If Applicable).     If you need a refill on your cardiac medications before your next appointment, please call your pharmacy.

## 2016-12-18 ENCOUNTER — Ambulatory Visit (HOSPITAL_COMMUNITY)
Admission: RE | Admit: 2016-12-18 | Discharge: 2016-12-18 | Disposition: A | Discharge status: Home / Self Care | Payer: Medicare Other | Source: Ambulatory Visit | Attending: Cardiovascular Disease | Admitting: Cardiovascular Disease

## 2016-12-18 ENCOUNTER — Encounter (HOSPITAL_COMMUNITY): Payer: Self-pay

## 2016-12-18 ENCOUNTER — Encounter (HOSPITAL_COMMUNITY)
Admission: RE | Admit: 2016-12-18 | Discharge: 2016-12-18 | Disposition: A | Discharge status: Home / Self Care | Payer: Medicare Other | Source: Ambulatory Visit | Attending: Cardiovascular Disease | Admitting: Cardiovascular Disease

## 2016-12-18 ENCOUNTER — Ambulatory Visit (HOSPITAL_BASED_OUTPATIENT_CLINIC_OR_DEPARTMENT_OTHER)
Admission: RE | Admit: 2016-12-18 | Discharge: 2016-12-18 | Disposition: A | Discharge status: Home / Self Care | Payer: Medicare Other | Source: Ambulatory Visit | Attending: Cardiovascular Disease | Admitting: Cardiovascular Disease

## 2016-12-18 DIAGNOSIS — R079 Chest pain, unspecified: Secondary | ICD-10-CM | POA: Diagnosis not present

## 2016-12-18 DIAGNOSIS — R0989 Other specified symptoms and signs involving the circulatory and respiratory systems: Secondary | ICD-10-CM | POA: Diagnosis present

## 2016-12-18 LAB — NM MYOCAR MULTI W/SPECT W/WALL MOTION / EF
CHL CUP NUCLEAR SDS: 9
CHL CUP NUCLEAR SRS: 1
CHL CUP NUCLEAR SSS: 10
LHR: 0.46
LV dias vol: 67 mL (ref 46–106)
LV sys vol: 34 mL
Peak HR: 90 {beats}/min
Rest HR: 73 {beats}/min
TID: 1.27

## 2016-12-18 MED ORDER — REGADENOSON 0.4 MG/5ML IV SOLN
INTRAVENOUS | Status: AC
  Administered 2016-12-18: 0.4 mg via INTRAVENOUS
  Filled 2016-12-18: qty 5

## 2016-12-18 MED ORDER — TECHNETIUM TC 99M TETROFOSMIN IV KIT
10.0000 | PACK | Freq: Once | INTRAVENOUS | Status: AC | PRN
  Administered 2016-12-18: 10 via INTRAVENOUS

## 2016-12-18 MED ORDER — TECHNETIUM TC 99M TETROFOSMIN IV KIT
30.0000 | PACK | Freq: Once | INTRAVENOUS | Status: AC | PRN
  Administered 2016-12-18: 30 via INTRAVENOUS

## 2016-12-18 MED ORDER — SODIUM CHLORIDE 0.9% FLUSH
INTRAVENOUS | Status: AC
  Administered 2016-12-18: 10 mL via INTRAVENOUS
  Filled 2016-12-18: qty 10

## 2017-08-02 ENCOUNTER — Other Ambulatory Visit (HOSPITAL_COMMUNITY): Payer: Self-pay | Admitting: Family Medicine

## 2017-08-02 ENCOUNTER — Ambulatory Visit (HOSPITAL_COMMUNITY)
Admission: RE | Admit: 2017-08-02 | Discharge: 2017-08-02 | Disposition: A | Discharge status: Home / Self Care | Payer: Medicare Other | Source: Ambulatory Visit | Attending: Family Medicine | Admitting: Family Medicine

## 2017-08-02 DIAGNOSIS — I70209 Unspecified atherosclerosis of native arteries of extremities, unspecified extremity: Secondary | ICD-10-CM | POA: Diagnosis not present

## 2017-08-02 DIAGNOSIS — R52 Pain, unspecified: Secondary | ICD-10-CM

## 2017-08-02 DIAGNOSIS — I700 Atherosclerosis of aorta: Secondary | ICD-10-CM | POA: Insufficient documentation

## 2017-08-02 DIAGNOSIS — M4322 Fusion of spine, cervical region: Secondary | ICD-10-CM | POA: Insufficient documentation

## 2017-08-02 DIAGNOSIS — Z9889 Other specified postprocedural states: Secondary | ICD-10-CM | POA: Diagnosis not present

## 2017-08-02 DIAGNOSIS — M25551 Pain in right hip: Secondary | ICD-10-CM | POA: Diagnosis present

## 2017-09-02 ENCOUNTER — Encounter: Payer: Self-pay | Admitting: Orthopedic Surgery

## 2017-09-02 ENCOUNTER — Ambulatory Visit (INDEPENDENT_AMBULATORY_CARE_PROVIDER_SITE_OTHER): Payer: Medicare Other | Admitting: Orthopedic Surgery

## 2017-09-02 VITALS — BP 133/79 | HR 80 | Ht 59.5 in | Wt 177.0 lb

## 2017-09-02 DIAGNOSIS — M6530 Trigger finger, unspecified finger: Secondary | ICD-10-CM | POA: Diagnosis not present

## 2017-09-02 DIAGNOSIS — M65312 Trigger thumb, left thumb: Secondary | ICD-10-CM | POA: Diagnosis not present

## 2017-09-02 NOTE — Patient Instructions (Signed)
Trigger Finger Trigger finger (stenosing tenosynovitis) is a condition that causes a finger to get stuck in a bent position. Each finger has a tough, cord-like tissue that connects muscle to bone (tendon), and each tendon is surrounded by a tunnel of tissue (tendon sheath). To move your finger, your tendon needs to slide freely through the sheath. Trigger finger happens when the tendon or the sheath thickens, making it difficult to move your finger. Trigger finger can affect any finger or a thumb. It may affect more than one finger. Mild cases may clear up with rest and medicine. Severe cases require more treatment. What are the causes? Trigger finger is caused by a thickened finger tendon or tendon sheath. The cause of this thickening is not known. What increases the risk? The following factors may make you more likely to develop this condition:  Doing activities that require a strong grip.  Having rheumatoid arthritis, gout, or diabetes.  Being 40-60 years old.  Being a woman.  What are the signs or symptoms? Symptoms of this condition include:  Pain when bending or straightening your finger.  Tenderness or swelling where your finger attaches to the palm of your hand.  A lump in the palm of your hand or on the inside of your finger.  Hearing a popping sound when you try to straighten your finger.  Feeling a popping, catching, or locking sensation when you try to straighten your finger.  Being unable to straighten your finger.  How is this diagnosed? This condition is diagnosed based on your symptoms and a physical exam. How is this treated? This condition may be treated by:  Resting your finger and avoiding activities that make symptoms worse.  Wearing a finger splint to keep your finger in a slightly bent position.  Taking NSAIDs to relieve pain and swelling.  Injecting medicine (steroids) into the tendon sheath to reduce swelling and irritation. Injections may need to be  repeated.  Having surgery to open the tendon sheath. This may be done if other treatments do not work and you cannot straighten your finger. You may need physical therapy after surgery.  Follow these instructions at home:  Use moist heat to help reduce pain and swelling as told by your health care provider.  Rest your finger and avoid activities that make pain worse. Return to normal activities as told by your health care provider.  If you have a splint, wear it as told by your health care provider.  Take over-the-counter and prescription medicines only as told by your health care provider.  Keep all follow-up visits as told by your health care provider. This is important. Contact a health care provider if:  Your symptoms are not improving with home care. Summary  Trigger finger (stenosing tenosynovitis) causes your finger to get stuck in a bent position, and it can make it difficult and painful to straighten your finger.  This condition develops when a finger tendon or tendon sheath thickens.  Treatment starts with resting, wearing a splint, and taking NSAIDs.  In severe cases, surgery to open the tendon sheath may be needed. This information is not intended to replace advice given to you by your health care provider. Make sure you discuss any questions you have with your health care provider. Document Released: 02/11/2004 Document Revised: 04/03/2016 Document Reviewed: 04/03/2016 Elsevier Interactive Patient Education  2017 [employer]  

## 2017-09-02 NOTE — Progress Notes (Signed)
Patient ID: [name], female   DOB: [birth year], 67 y.o.   MRN: [id number]  Chief Complaint  Patient presents with  . Hand Pain    Left thumb trigger finger.    HPI [name] is a 67 y.o. female.  Comes in for evaluation of 2 issues with her hand.  She is status post carpal tunnel release and trigger finger releases done by Dr. Luna Glasgow in the past comes in today with a knot over the left volar aspect of the thumb with catching and locking in the right long finger is also tender over the A1 pulley with catching and locking.  Right long finger symptoms 4-year left thumb for 8 weeks which is actually worse.  Pain is described as sharp and dull.  Review of Systems Review of Systems  Respiratory: Negative for shortness of breath.   Cardiovascular: Negative for chest pain.  Neurological: Negative for weakness and numbness.    Past Medical History:  Diagnosis Date  . Arthritis   . CAD (coronary artery disease)    Cath 2008 total RCA with collaterals  . Cancer ([place])    kidney  . Chronic kidney disease 08/2011   kidney cancer  . Diabetes mellitus   . Glaucoma   . HTN (hypertension), benign   . Hyperlipidemia   . Myocardial infarction ([place]) 2000, 2007  . Neuromuscular disorder ([place])    neck & bilateral hands  . Pneumonia 1990  . Seizures ([place]) 40 years ago   pre eclampsia  . Shortness of breath     Past Surgical History:  Procedure Laterality Date  . ABDOMINAL HYSTERECTOMY    . nacrotizing faschitis    . NECK SURGERY    . ROTATOR CUFF REPAIR     right arm    Family History  Problem Relation Age of Onset  . Heart attack Father     Social History Social History   Tobacco Use  . Smoking status: Former Smoker    Last attempt to quit: 05/08/1999    Years since quitting: 18.3  . Smokeless tobacco: Never Used  Substance Use Topics  . Alcohol use: No    Comment: rarely drinks glass of wine  . Drug use: No    Allergies  Allergen Reactions  . Insect Extract  Allergy Skin Test Swelling  . Other     Optical meds unknown to pt  . Propine [Dipivefrin] Itching and Other (See Comments)    Makes eyes turn a reddish orange color.  . Travatan [Travoprost] Itching and Other (See Comments)    Makes eyes turn a reddish orange color  . Xalatan [Latanoprost] Itching and Other (See Comments)    Makes eyes turn a reddish orange color.     Current Outpatient Medications  Medication Sig Dispense Refill  . aspirin EC 81 MG tablet Take 81 mg by mouth daily.    . Coenzyme Q10 (CO Q 10) 100 MG CAPS Take 1 tablet by mouth at bedtime.    Marland Kitchen ezetimibe-simvastatin (VYTORIN) 10-10 MG per tablet Take 1 tablet by mouth at bedtime.      . Glucosamine-Chondroit-Vit C-Mn (GLUCOSAMINE CHONDR 1500 COMPLX PO) Take 1 tablet by mouth 2 (two) times daily.    . hydrochlorothiazide 25 MG tablet Take 25 mg by mouth daily.      Marland Kitchen HYDROmorphone (DILAUDID) 4 MG tablet Take 4 mg by mouth every 4 (four) hours as needed for severe pain.    Marland Kitchen insulin aspart protamine-insulin aspart (NOVOLOG 70/30) (  70-30) 100 UNIT/ML injection Inject 25 Units into the skin 2 (two) times daily with a meal.     . insulin glargine (LANTUS) 100 UNIT/ML injection Inject 50 Units into the skin at bedtime.     . isosorbide mononitrate (IMDUR) 30 MG 24 hr tablet Take 30 mg by mouth daily.      . metoprolol (TOPROL-XL) 100 MG 24 hr tablet Take 100 mg by mouth daily.      . Multiple Vitamin (MULTIVITAMIN) tablet Take 1 tablet by mouth daily.     . nitroGLYCERIN (NITROSTAT) 0.4 MG SL tablet Place 0.4 mg under the tongue every 5 (five) minutes as needed for chest pain.    Marland Kitchen oxymetazoline (AFRIN) 0.05 % nasal spray Place 2 sprays into the nose 2 (two) times daily.    . ramipril (ALTACE) 10 MG capsule Take 10 mg by mouth daily.      Marland Kitchen senna (SENOKOT) 8.6 MG tablet Take 1 tablet by mouth as needed for constipation.     Marland Kitchen Spirulina 500 MG TABS Take 500 mg by mouth 3 (three) times daily.     No current  facility-administered medications for this visit.      Physical Exam Physical Exam  Constitutional: She is oriented to person, place, and time. She appears well-developed and well-nourished.  Neurological: She is alert and oriented to person, place, and time.  Psychiatric: She has a normal mood and affect. Judgment normal.  Vitals reviewed.  Blood pressure 133/79, pulse 80, height 4' 11.5" (1.511 m), weight 177 lb (80.3 kg). Appearance, there are no abnormalities in terms of appearance the patient was well-developed and well-nourished. The grooming and hygiene were normal.  Mental status orientation, there was normal alertness and orientation Mood pleasant Ambulatory status normal with no assistive devices  Examination of the LEFT THUMB / hand reveals tenderness over the A1 pulley of the LEFT THUMB. Range of motion remains normal with clicking on flexion extension.  Stability tests show no abnormality of the IP joints are MP joint. The FDP and FDS strength is normal Skin warm dry and intact without laceration or ulceration or erythema Neurologic examination normal sensation Vascular examination normal pulses with warm extremity and normal capillary refill  The opposite extremity long finger is tender over the A1 pulley, alignment is normal.  Full range of motion is normal as well.  No instability skin is normal color capillary refill normal sensation normal lymph nodes normal  MEDICAL DECISION SECTION  xrays ordered? NO  My independent reading of xrays: N/A   Encounter Diagnoses  Name Primary?  . Trigger finger, acquired Yes  . Trigger finger of left thumb    Injection left thumb A1 pulley injection left thumb 3 cc lidocaine 1% plain 40 mg Depo-Medrol.  Ethyl chloride alcohol use for prep no complications  Right long finger A1 pulley injection 3 cc lidocaine 1% plain, 40 mg Depo-Medrol.  At the chloride and alcohol use for skin prep no complications  PLAN:   Recommend she  call me back in 2 weeks and let me know if the injections worked on the fingers if not were going to proceed with carpal releases of the trigger fingers or thumb  And she will set up an appointment for her shoulder to evaluate her left rotator cuff.

## 2017-10-21 NOTE — Progress Notes (Signed)
Patient ID: [name], female   DOB: Jul 12, 1950, 67 y.o.   MRN: [id number]   [name] returns today for followup. She has known coronary disease. She has a total right coronary artery with collaterals. She does not have critical left-sided disease Last cath 2008 .Marland Kitchen Her BS have been well controlled according to her. She does not know her A1c but it was just checked by [name]. She Denies SSCP, palpitations, or dyspnea. She has been compliant with her meds. She is active.   Was hospitalized for pancreatitis in April 2013 . CT showed left renal mass 3.7cm Had left nephrectomy for this   Last myovue: 12/18/16 low risk   There was no ST segment deviation noted during stress.  Defect 1: There is a medium defect of moderate severity present in the basal anteroseptal, basal inferior, basal inferolateral, mid anteroseptal, mid inferior, mid inferolateral and apical inferior location, consistent with myocardial scar.  Findings consistent with prior myocardial infarction with a minimal degree of inferoapical peri-infarct ischemia.  This is a low risk study.  Nuclear stress EF: 50%.   No chest pain  Still eating a lot of bread/potatoes but has gone down on lantus dose  Ambulation limited by chronic right hip pian She indicates kidney function worse and being Referred to nephrology She does not want to be on dialysis " I lived twice as long as my mother already"   ROS: Denies fever, malais, weight loss, blurry vision, decreased visual acuity, cough, sputum, SOB, hemoptysis, pleuritic pain, palpitaitons, heartburn, abdominal pain, melena, lower extremity edema, claudication, or rash.  All other systems reviewed and negative  General: BP (!) 146/80   Pulse 84   Ht 4\' 11"  (1.499 m)   Wt 182 lb (82.6 kg)   SpO2 97%   BMI 36.76 kg/m  Affect appropriate Overweight black female  HEENT: normal Neck supple with no adenopathy JVP normal right  bruits no thyromegaly Lungs clear with no wheezing and  good diaphragmatic motion Heart:  S1/S2 no murmur, no rub, gallop or click PMI normal Abdomen: benighn, BS positve, no tenderness, no AAA no bruit.  No HSM or HJR Distal pulses intact with no bruits Plus one bilateral edema right ankle worse than left  Neuro non-focal Skin warm and dry No muscular weakness    Current Outpatient Medications  Medication Sig Dispense Refill  . aspirin EC 81 MG tablet Take 81 mg by mouth daily.    . Coenzyme Q10 (CO Q 10) 100 MG CAPS Take 1 tablet by mouth at bedtime.    Marland Kitchen ezetimibe-simvastatin (VYTORIN) 10-10 MG per tablet Take 1 tablet by mouth at bedtime.      . Glucosamine-Chondroit-Vit C-Mn (GLUCOSAMINE CHONDR 1500 COMPLX PO) Take 1 tablet by mouth 2 (two) times daily.    . hydrochlorothiazide 25 MG tablet Take 25 mg by mouth daily.      Marland Kitchen HYDROmorphone (DILAUDID) 4 MG tablet Take 4 mg by mouth every 4 (four) hours as needed for severe pain.    Marland Kitchen insulin aspart protamine-insulin aspart (NOVOLOG 70/30) (70-30) 100 UNIT/ML injection Inject 25 Units into the skin 2 (two) times daily with a meal.     . insulin glargine (LANTUS) 100 UNIT/ML injection Inject 50 Units into the skin at bedtime.     . isosorbide mononitrate (IMDUR) 30 MG 24 hr tablet Take 30 mg by mouth daily.      . metoprolol (TOPROL-XL) 100 MG 24 hr tablet Take 100 mg by mouth daily.      Marland Kitchen  Multiple Vitamin (MULTIVITAMIN) tablet Take 1 tablet by mouth daily.     . nitroGLYCERIN (NITROSTAT) 0.4 MG SL tablet Place 0.4 mg under the tongue every 5 (five) minutes as needed for chest pain.    Marland Kitchen oxymetazoline (AFRIN) 0.05 % nasal spray Place 2 sprays into the nose 2 (two) times daily.    . ramipril (ALTACE) 10 MG capsule Take 10 mg by mouth daily.      Marland Kitchen senna (SENOKOT) 8.6 MG tablet Take 1 tablet by mouth as needed for constipation.     Marland Kitchen Spirulina 500 MG TABS Take 500 mg by mouth 3 (three) times daily.     No current facility-administered medications for this visit.     Allergies  Insect  extract allergy skin test; Other; Propine [dipivefrin]; Travatan [travoprost]; and Xalatan [latanoprost]  Electrocardiogram:  SR old IMI poor R wave progression 2014 today no change SR rate 85 Old IMI poor R wave progression minor lateral T wave changes  SR rate 74 T wave inversion 3,F poor R wave progression no acute changes 10/22/16  SR rate 64 old IMI poor R wave progression   Assessment and Plan  CAD:  Total RCA with collaterals  Low risk myovue 2013  Low risk myovue 12/18/16 with inferior/inferior lateral scar no significant ischemia EF 50% continue medical Rx   DM:  Discussed low carb diet.  Target hemoglobin A1c is 6.5 or less.  Continue current medications.  Chol:   Continue vytorin labs with primary   Renal: post left nephrectomy Cr baseline around 2.2 f/u labs with primary referred to nephrology   Bruit: duplex 12/18/16 no significant stenosis          [name]

## 2017-10-22 ENCOUNTER — Encounter: Payer: Self-pay | Admitting: Cardiovascular Disease

## 2017-10-22 ENCOUNTER — Ambulatory Visit (INDEPENDENT_AMBULATORY_CARE_PROVIDER_SITE_OTHER): Payer: Medicare Other | Admitting: Cardiovascular Disease

## 2017-10-22 VITALS — BP 146/80 | HR 84 | Ht 59.0 in | Wt 182.0 lb

## 2017-10-22 DIAGNOSIS — I10 Essential (primary) hypertension: Secondary | ICD-10-CM | POA: Diagnosis not present

## 2017-10-22 NOTE — Patient Instructions (Signed)
Medication Instructions:  Your physician recommends that you continue on your current medications as directed. Please refer to the Current Medication list given to you today.  Labwork: NONE  Testing/Procedures: NONE  Your physician wants you to follow-up in: 1 YEAR.  You will receive a reminder letter in the mail two months in advance. If you don't receive a letter, please call our office to schedule the follow-up appointment.   Any Other Special Instructions Will Be Listed Below (If Applicable).     If you need a refill on your cardiac medications before your next appointment, please call your pharmacy.

## 2017-11-25 ENCOUNTER — Other Ambulatory Visit (HOSPITAL_COMMUNITY): Payer: Self-pay | Admitting: Nephrology

## 2017-11-25 DIAGNOSIS — N183 Chronic kidney disease, stage 3 unspecified: Secondary | ICD-10-CM

## 2017-12-17 ENCOUNTER — Ambulatory Visit (HOSPITAL_COMMUNITY): Payer: Medicare Other

## 2018-02-10 ENCOUNTER — Other Ambulatory Visit (HOSPITAL_BASED_OUTPATIENT_CLINIC_OR_DEPARTMENT_OTHER): Payer: Self-pay

## 2018-02-10 DIAGNOSIS — G4733 Obstructive sleep apnea (adult) (pediatric): Secondary | ICD-10-CM

## 2018-02-17 ENCOUNTER — Ambulatory Visit: Payer: Medicare Other | Attending: Neurology | Admitting: Neurology

## 2018-02-17 DIAGNOSIS — G4733 Obstructive sleep apnea (adult) (pediatric): Secondary | ICD-10-CM | POA: Diagnosis not present

## 2018-02-19 NOTE — Procedures (Signed)
[place] A. Merlene Laughter, MD     [email or web address]             NOCTURNAL POLYSOMNOGRAPHY   LOCATION: [facility]   Patient Name: [name], [name] Date: 02/17/2018 Gender: Female D.O.B: 14-Oct-1950 Age (years): 27 Referring Provider: Phillips Odor MD, ABSM Height (inches): 59 Interpreting Physician: Phillips Odor MD, ABSM Weight (lbs): 182 RPSGT: Rosebud Poles BMI: 37 MRN: [id number] Neck Size: 16.50 CLINICAL INFORMATION Sleep Study Type: NPSG     Indication for sleep study: N/A     Epworth Sleepiness Score: 5     SLEEP STUDY TECHNIQUE As per the AASM Manual for the Scoring of Sleep and Associated Events v2.3 (April 2016) with a hypopnea requiring 4% desaturations.  The channels recorded and monitored were frontal, central and occipital EEG, electrooculogram (EOG), submentalis EMG (chin), nasal and oral airflow, thoracic and abdominal wall motion, anterior tibialis EMG, snore microphone, electrocardiogram, and pulse oximetry.  MEDICATIONS Medications self-administered by patient taken the night of the study : N/A  Current Outpatient Medications:  .  aspirin EC 81 MG tablet, Take 81 mg by mouth daily., Disp: , Rfl:  .  Coenzyme Q10 (CO Q 10) 100 MG CAPS, Take 1 tablet by mouth at bedtime., Disp: , Rfl:  .  ezetimibe-simvastatin (VYTORIN) 10-10 MG per tablet, Take 1 tablet by mouth at bedtime.  , Disp: , Rfl:  .  Glucosamine-Chondroit-Vit C-Mn (GLUCOSAMINE CHONDR 1500 COMPLX PO), Take 1 tablet by mouth 2 (two) times daily., Disp: , Rfl:  .  hydrochlorothiazide 25 MG tablet, Take 25 mg by mouth daily.  , Disp: , Rfl:  .  HYDROmorphone (DILAUDID) 4 MG tablet, Take 4 mg by mouth every 4 (four) hours as needed for severe pain., Disp: , Rfl:  .  insulin aspart protamine-insulin aspart (NOVOLOG 70/30) (70-30) 100 UNIT/ML injection, Inject 25 Units into the skin 2 (two) times daily with a meal. , Disp: , Rfl:  .  insulin glargine (LANTUS) 100  UNIT/ML injection, Inject 50 Units into the skin at bedtime. , Disp: , Rfl:  .  isosorbide mononitrate (IMDUR) 30 MG 24 hr tablet, Take 30 mg by mouth daily.  , Disp: , Rfl:  .  metoprolol (TOPROL-XL) 100 MG 24 hr tablet, Take 100 mg by mouth daily.  , Disp: , Rfl:  .  Multiple Vitamin (MULTIVITAMIN) tablet, Take 1 tablet by mouth daily. , Disp: , Rfl:  .  nitroGLYCERIN (NITROSTAT) 0.4 MG SL tablet, Place 0.4 mg under the tongue every 5 (five) minutes as needed for chest pain., Disp: , Rfl:  .  oxymetazoline (AFRIN) 0.05 % nasal spray, Place 2 sprays into the nose 2 (two) times daily., Disp: , Rfl:  .  ramipril (ALTACE) 10 MG capsule, Take 10 mg by mouth daily.  , Disp: , Rfl:  .  senna (SENOKOT) 8.6 MG tablet, Take 1 tablet by mouth as needed for constipation. , Disp: , Rfl:  .  Spirulina 500 MG TABS, Take 500 mg by mouth 3 (three) times daily., Disp: , Rfl:      SLEEP ARCHITECTURE The study was initiated at 10:24:15 PM and ended at 5:03:01 AM.  Sleep onset time was 37.2 minutes and the sleep efficiency was 86.8%%. The total sleep time was 346 minutes.  Stage REM latency was 138.0 minutes.  The patient spent 2.5%% of the night in stage N1 sleep, 63.2%% in stage N2 sleep, 21.8%% in stage N3 and 12.6% in REM.  Alpha intrusion was absent.  Supine sleep was 3.10%.  RESPIRATORY PARAMETERS The overall apnea/hypopnea index (AHI) was 49.9 per hour. There were 99 total apneas, including 99 obstructive, 0 central and 0 mixed apneas. There were 189 hypopneas and 7 RERAs.  The AHI during Stage REM sleep was 92.4 per hour.  AHI while supine was 117.5 per hour.  The mean oxygen saturation was 88.8%. The minimum SpO2 during sleep was 58.0%.  moderate snoring was noted during this study.  CARDIAC DATA The 2 lead EKG demonstrated sinus rhythm. The mean heart rate was 80.6 beats per minute. Other EKG findings include: PVCs. LEG MOVEMENT DATA The total PLMS were 0 with a resulting PLMS index of  0.0. Associated arousal with leg movement index was 0.0.  IMPRESSIONS Severe obstructive sleep apnea worse during REM sleep is documented. A formal CPAP titration recording is suggested.      ELECTRONICALLY SIGNED ON:  02/19/2018, 10:15 AM [facility] PH: [phone]   FX: [phone] [place]

## 2018-05-16 IMAGING — DX DG CERVICAL SPINE COMPLETE 4+V
6 series · 6 of 6 positions shown · non-contrast
Comparison: None.

CLINICAL DATA: Cervicalgia with left-sided radicular symptoms

EXAM:
CERVICAL SPINE - COMPLETE 4+ VIEW

[c-spine lat]
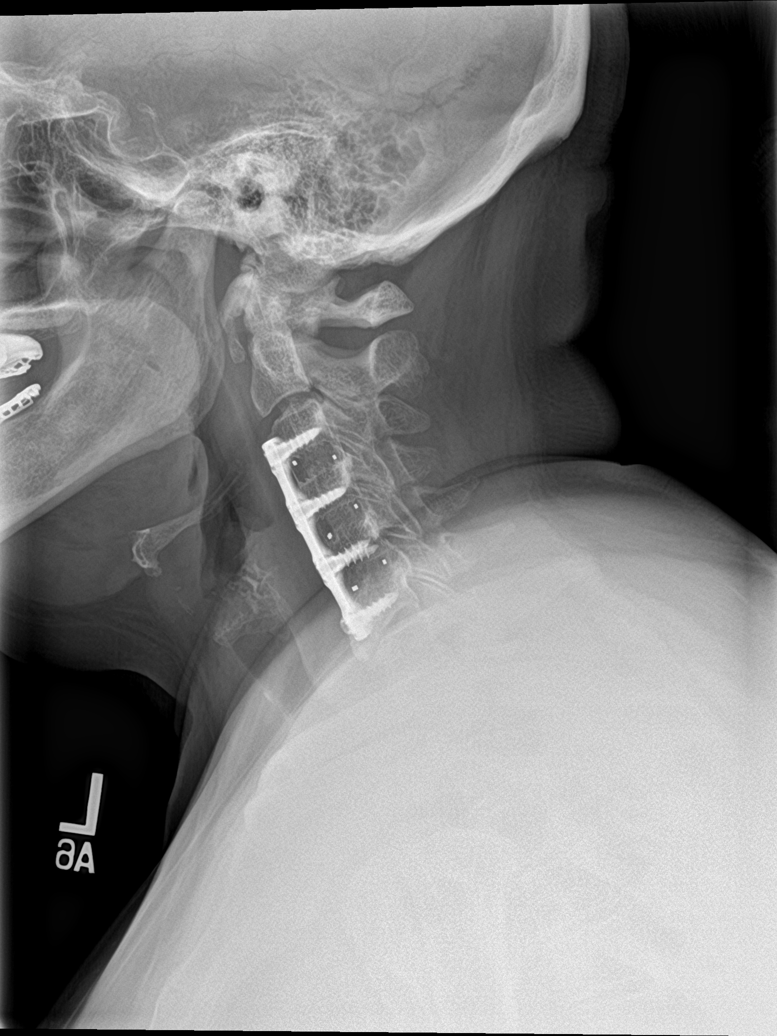

[c-spine obl (1 of 2)]
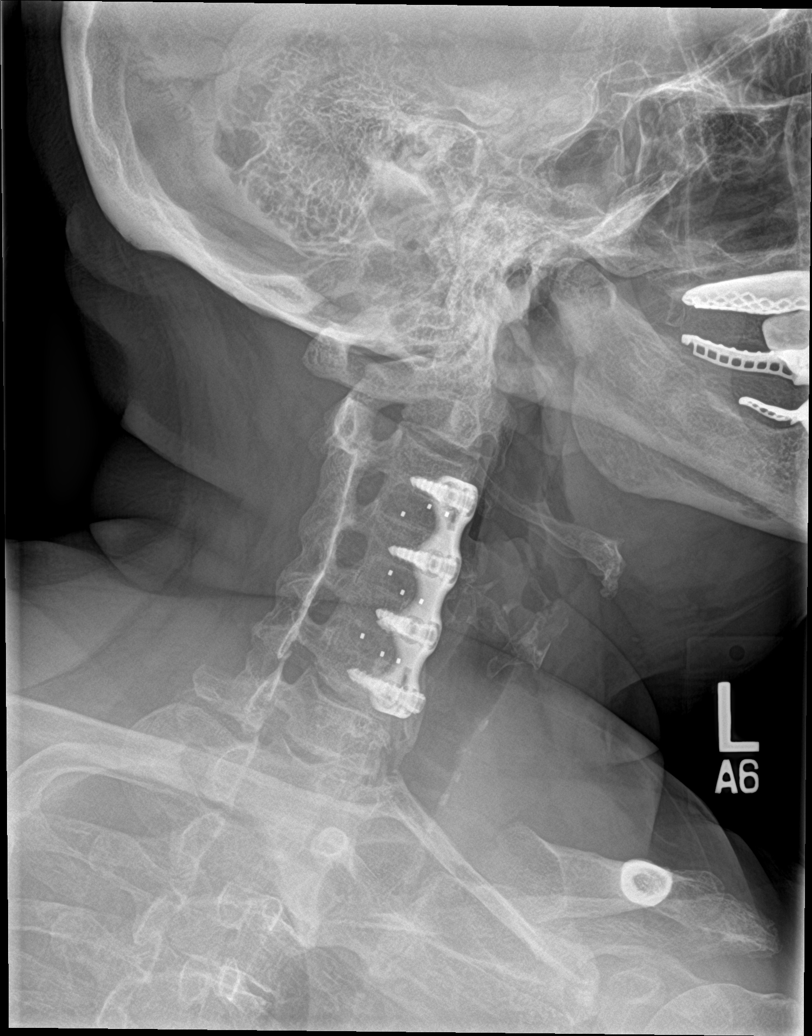

[c-spine obl (2 of 2)]
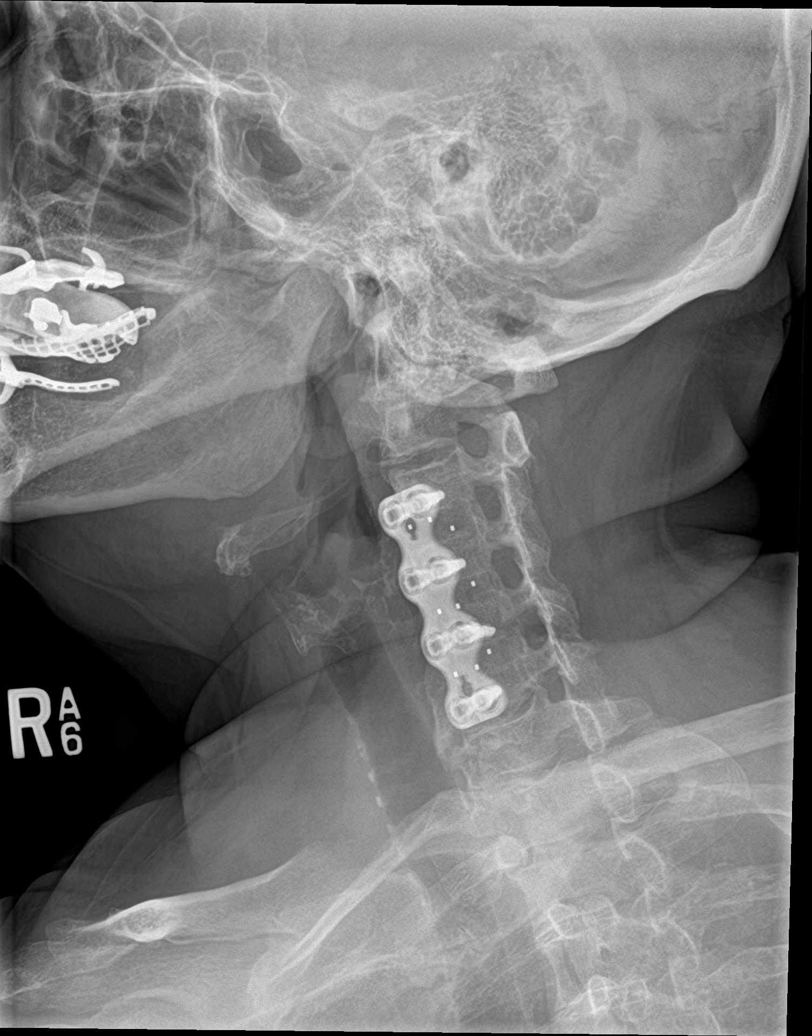

[c-spine ap]
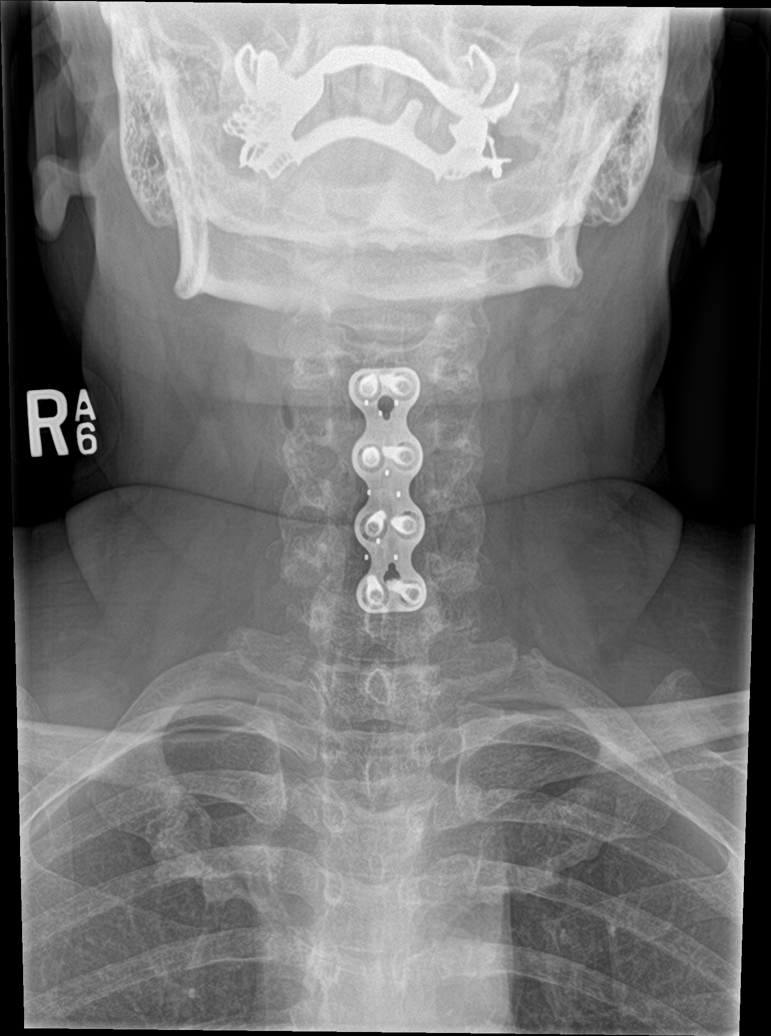

[c-spine open mouth]
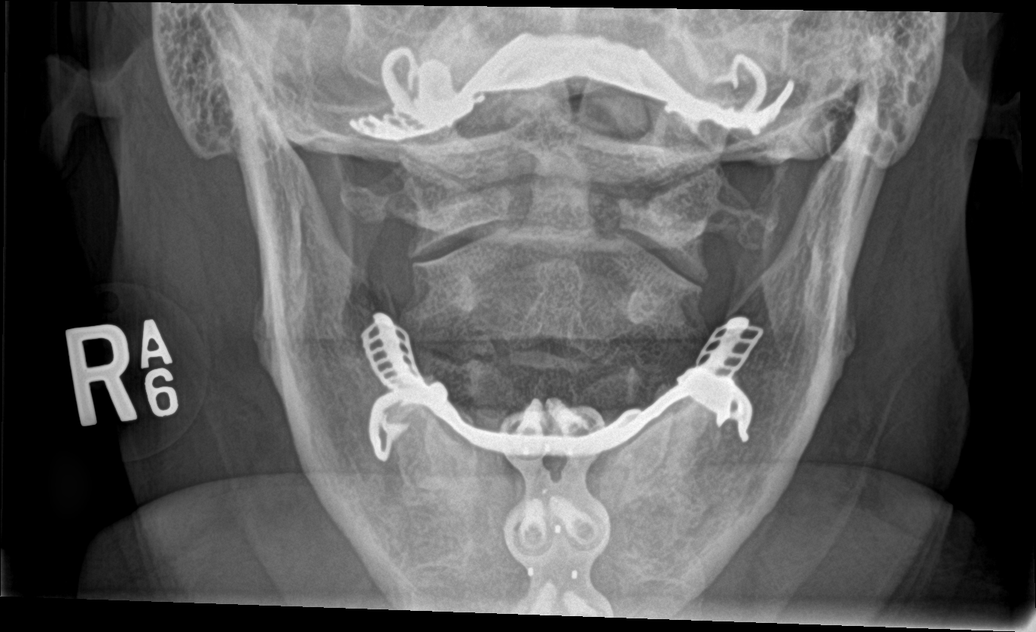

[c-spine swimmers trauma]
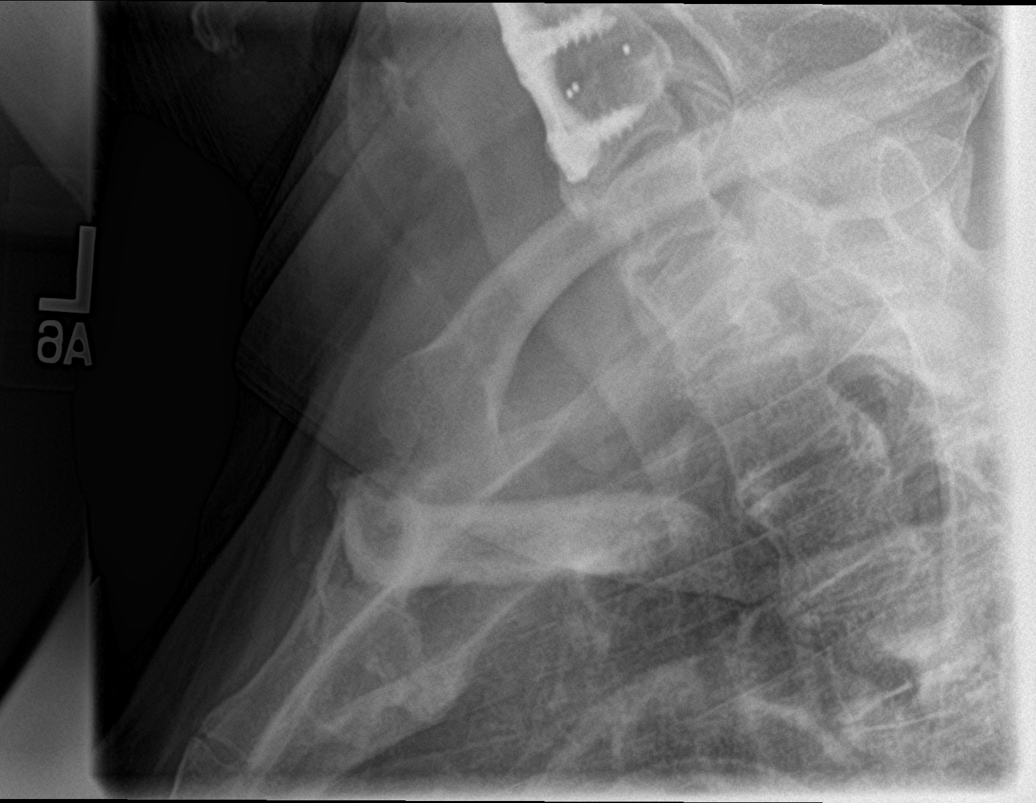

[6 of 6 positions shown; findings below may reference images not displayed]

FINDINGS: Frontal, lateral, open-mouth odontoid, and bilateral oblique views
were obtained. Patient is status post anterior screw and plate
fixation from C3-C6. There are disc spacers at C3-4, C4-5, and C5-6.
There is no fracture or spondylolisthesis. Prevertebral soft tissues
and predental space regions are normal. There is ankylosis at C3-4,
C4-5 and C5-6. There is moderate disc space narrowing at C6-7 and
C7-T1. There is no appreciable exit foraminal narrowing on the
oblique views. Lung apices are clear.
IMPRESSION: Postoperative change from C3-C6 with support hardware intact. Disc
spacers with ankylosis at C3-4, C4-5, C5-6. Moderate disc space
narrowing noted at C6-7 and C7-T1. No evident fracture or
spondylolisthesis.

## 2018-08-31 ENCOUNTER — Encounter: Payer: Self-pay | Admitting: Orthopedic Surgery

## 2018-11-10 ENCOUNTER — Ambulatory Visit (INDEPENDENT_AMBULATORY_CARE_PROVIDER_SITE_OTHER): Payer: Medicare Other | Admitting: Orthopedic Surgery

## 2018-11-10 ENCOUNTER — Encounter: Payer: Self-pay | Admitting: Orthopedic Surgery

## 2018-11-10 ENCOUNTER — Other Ambulatory Visit: Payer: Self-pay

## 2018-11-10 VITALS — BP 147/83 | HR 98 | Temp 97.7°F | Ht 59.5 in | Wt 177.0 lb

## 2018-11-10 DIAGNOSIS — M65312 Trigger thumb, left thumb: Secondary | ICD-10-CM

## 2018-11-10 DIAGNOSIS — M65321 Trigger finger, right index finger: Secondary | ICD-10-CM

## 2018-11-10 DIAGNOSIS — M65331 Trigger finger, right middle finger: Secondary | ICD-10-CM

## 2018-11-10 NOTE — Progress Notes (Signed)
   Chief complaint multiple trigger fingers requests injections  Right index finger Trigger finger injection  Diagnosis stenosing tenosynovitis right index finger Procedure injection A1 pulley Medications lidocaine 1% 1 mL and Depo-Medrol 40 mg 1 mL Skin prep alcohol and ethyl chloride Verbal consent was obtained Timeout confirmed the injection site  After cleaning the skin with alcohol and anesthetizing the skin with ethyl chloride the A1 pulley was palpated and the injection was performed without complication  Right long finger. Trigger finger injection  Diagnosis stenosing tenosynovitis right long finger Procedure injection A1 pulley Medications lidocaine 1% 1 mL and Depo-Medrol 40 mg 1 mL Skin prep alcohol and ethyl chloride Verbal consent was obtained Timeout confirmed the injection site  After cleaning the skin with alcohol and anesthetizing the skin with ethyl chloride the A1 pulley was palpated and the injection was performed without complication  Left thumb Trigger finger injection  Diagnosis stenosing tenosynovitis left thumb Procedure injection A1 pulley Medications lidocaine 1% 1 mL and Depo-Medrol 40 mg 1 mL Skin prep alcohol and ethyl chloride Verbal consent was obtained Timeout confirmed the injection site  After cleaning the skin with alcohol and anesthetizing the skin with ethyl chloride the A1 pulley was palpated and the injection was performed without complication   Encounter Diagnoses  Name Primary?  . Trigger finger of left thumb Yes  . Trigger finger, right middle finger   . Trigger finger, right index finger

## 2018-11-16 NOTE — Progress Notes (Signed)
Patient ID: [name], female   DOB: [birth year], 68 y.o.   MRN: [id number]     [name] returns today for followup. She has known coronary disease. She has a total right coronary artery with collaterals. She does not have critical left-sided disease Last cath 2008 .Marland Kitchen Her BS have been well controlled according to her. She does not know her A1c but it was just checked by [name]. She Denies SSCP, palpitations, or dyspnea. She has been compliant with her meds. She is active.   Was hospitalized for pancreatitis in April 2013 . CT showed left renal mass 3.7cm Had left nephrectomy for this   Last myovue: 12/18/16 low risk   There was no ST segment deviation noted during stress.  Defect 1: There is a medium defect of moderate severity present in the basal anteroseptal, basal inferior, basal inferolateral, mid anteroseptal, mid inferior, mid inferolateral and apical inferior location, consistent with myocardial scar.  Findings consistent with prior myocardial infarction with a minimal degree of inferoapical peri-infarct ischemia.  This is a low risk study.  Nuclear stress EF: 50%.   No chest pain  Still eating a lot of bread/potatoes but has gone down on lantus dose  Ambulation limited by chronic right hip pian She indicates kidney function worse and being Referred to nephrology She does not want to be on dialysis " I lived twice as long as my mother already"  Had right trigger finger injection with [name] on 11/10/18   Has some LE edema. Takes lasix in am discussed taking latter in day and bid if needed   ROS: Denies fever, malais, weight loss, blurry vision, decreased visual acuity, cough, sputum, SOB, hemoptysis, pleuritic pain, palpitaitons, heartburn, abdominal pain, melena, lower extremity edema, claudication, or rash.  All other systems reviewed and negative  General: BP (!) 166/82   Pulse 78   Temp (!) 96.6 F (35.9 C)   Ht 4' 11.5" (1.511 m)   Wt 175 lb (79.4 kg)   BMI  34.75 kg/m  Affect appropriate Overweight black female  HEENT: normal Neck supple with no adenopathy JVP normal right  bruits no thyromegaly Lungs clear with no wheezing and good diaphragmatic motion Heart:  S1/S2 no murmur, no rub, gallop or click PMI normal Abdomen: benighn, BS positve, no tenderness, no AAA no bruit.  No HSM or HJR Distal pulses intact with no bruits Plus 2 bilateral edema right ankle worse than left  Neuro non-focal Skin warm and dry Right long finger trigger finger recent injection     Current Outpatient Medications  Medication Sig Dispense Refill  . aspirin EC 81 MG tablet Take 81 mg by mouth daily.    . Coenzyme Q10 (CO Q 10) 100 MG CAPS Take 1 tablet by mouth at bedtime.    Marland Kitchen ezetimibe-simvastatin (VYTORIN) 10-10 MG per tablet Take 1 tablet by mouth at bedtime.      . furosemide (LASIX) 40 MG tablet Take by mouth.    Marland Kitchen HYDROmorphone (DILAUDID) 4 MG tablet Take 4 mg by mouth every 4 (four) hours as needed for severe pain.    Marland Kitchen insulin glargine (LANTUS) 100 UNIT/ML injection Inject 50 Units into the skin at bedtime.     . isosorbide mononitrate (IMDUR) 30 MG 24 hr tablet Take 30 mg by mouth daily.      . metoprolol (TOPROL-XL) 100 MG 24 hr tablet Take 100 mg by mouth daily.      . Multiple Vitamin (MULTIVITAMIN) tablet Take 1 tablet  by mouth daily.     . nitroGLYCERIN (NITROSTAT) 0.4 MG SL tablet Place 0.4 mg under the tongue every 5 (five) minutes as needed for chest pain.    Marland Kitchen oxymetazoline (AFRIN) 0.05 % nasal spray Place 2 sprays into the nose 2 (two) times daily.    . ramipril (ALTACE) 10 MG capsule Take 10 mg by mouth daily.      Marland Kitchen senna (SENOKOT) 8.6 MG tablet Take 1 tablet by mouth as needed for constipation.     . sodium bicarbonate 650 MG tablet Take by mouth.    . Spirulina 500 MG TABS Take 500 mg by mouth 3 (three) times daily.     No current facility-administered medications for this visit.     Allergies  Insect extract allergy skin test,  Other, Propine [dipivefrin], Travatan [travoprost], and Xalatan [latanoprost]  Electrocardiogram:  SR old IMI poor R wave progression 2014 today no change SR rate 85 Old IMI poor R wave progression minor lateral T wave changes  SR rate 74 T wave inversion 3,F poor R wave progression no acute changes 10/22/16  SR rate 27 old IMI poor R wave progression   Assessment and Plan  CAD:  Total RCA with collaterals  Low risk myovue 2013  Low risk myovue 12/18/16 with inferior/inferior lateral scar no significant ischemia EF 50% continue medical Rx   DM:  Discussed low carb diet.  Target hemoglobin A1c is 6.5 or less.  Continue current medications.  Chol:   Continue vytorin labs with primary   Renal: post left nephrectomy Cr baseline around 2.2 f/u labs with primary Seeing [name] She Indicates she does not want fistula or dialysis   Bruit: duplex 12/18/16 no significant stenosis          [name]

## 2018-11-18 ENCOUNTER — Ambulatory Visit (INDEPENDENT_AMBULATORY_CARE_PROVIDER_SITE_OTHER): Payer: Medicare Other | Admitting: Cardiovascular Disease

## 2018-11-18 ENCOUNTER — Encounter: Payer: Self-pay | Admitting: Cardiovascular Disease

## 2018-11-18 ENCOUNTER — Other Ambulatory Visit: Payer: Self-pay

## 2018-11-18 VITALS — BP 166/82 | HR 78 | Temp 96.6°F | Ht 59.5 in | Wt 175.0 lb

## 2018-11-18 DIAGNOSIS — I2510 Atherosclerotic heart disease of native coronary artery without angina pectoris: Secondary | ICD-10-CM

## 2018-11-18 NOTE — Patient Instructions (Signed)

## 2019-01-30 DIAGNOSIS — R809 Proteinuria, unspecified: Secondary | ICD-10-CM | POA: Insufficient documentation

## 2019-02-24 ENCOUNTER — Telehealth: Payer: Self-pay | Admitting: Orthopedic Surgery

## 2019-03-02 NOTE — Telephone Encounter (Signed)
Patient is inquiring about being seen for shoulder problem, which she has already treated at [facility] - Dr Mardelle Matte. States transportation has been the problem as she does not drive. Discussed previous request for records and films which patient signed, however, it mentions [facility] - and this authorization has expired. Mailed updated auth form to patient to complete, sign, and return. Aware appointment pending Dr Ruthe Mannan review of records, imaging reports, and films.

## 2019-03-27 ENCOUNTER — Other Ambulatory Visit (HOSPITAL_COMMUNITY): Payer: Self-pay | Admitting: Nephrology

## 2019-03-27 DIAGNOSIS — Z992 Dependence on renal dialysis: Secondary | ICD-10-CM

## 2019-03-27 DIAGNOSIS — N186 End stage renal disease: Secondary | ICD-10-CM

## 2019-04-03 ENCOUNTER — Other Ambulatory Visit: Payer: Self-pay | Admitting: Physician Assistant

## 2019-04-06 ENCOUNTER — Ambulatory Visit (HOSPITAL_COMMUNITY)
Admission: RE | Admit: 2019-04-06 | Discharge: 2019-04-06 | Disposition: A | Discharge status: Home / Self Care | Payer: Medicare Other | Source: Ambulatory Visit | Attending: Nephrology | Admitting: Nephrology

## 2019-04-06 ENCOUNTER — Encounter (HOSPITAL_COMMUNITY): Payer: Self-pay | Admitting: Interventional Radiology

## 2019-04-06 ENCOUNTER — Other Ambulatory Visit (HOSPITAL_COMMUNITY): Payer: Self-pay | Admitting: Nephrology

## 2019-04-06 ENCOUNTER — Other Ambulatory Visit: Payer: Self-pay

## 2019-04-06 DIAGNOSIS — I120 Hypertensive chronic kidney disease with stage 5 chronic kidney disease or end stage renal disease: Secondary | ICD-10-CM | POA: Diagnosis not present

## 2019-04-06 DIAGNOSIS — I252 Old myocardial infarction: Secondary | ICD-10-CM | POA: Insufficient documentation

## 2019-04-06 DIAGNOSIS — E785 Hyperlipidemia, unspecified: Secondary | ICD-10-CM | POA: Insufficient documentation

## 2019-04-06 DIAGNOSIS — N186 End stage renal disease: Secondary | ICD-10-CM | POA: Diagnosis not present

## 2019-04-06 DIAGNOSIS — E1122 Type 2 diabetes mellitus with diabetic chronic kidney disease: Secondary | ICD-10-CM | POA: Diagnosis not present

## 2019-04-06 DIAGNOSIS — Z79899 Other long term (current) drug therapy: Secondary | ICD-10-CM | POA: Insufficient documentation

## 2019-04-06 DIAGNOSIS — I2510 Atherosclerotic heart disease of native coronary artery without angina pectoris: Secondary | ICD-10-CM | POA: Insufficient documentation

## 2019-04-06 DIAGNOSIS — Z992 Dependence on renal dialysis: Secondary | ICD-10-CM | POA: Diagnosis not present

## 2019-04-06 DIAGNOSIS — Z794 Long term (current) use of insulin: Secondary | ICD-10-CM | POA: Insufficient documentation

## 2019-04-06 DIAGNOSIS — Z7982 Long term (current) use of aspirin: Secondary | ICD-10-CM | POA: Insufficient documentation

## 2019-04-06 DIAGNOSIS — R569 Unspecified convulsions: Secondary | ICD-10-CM | POA: Insufficient documentation

## 2019-04-06 DIAGNOSIS — Z87891 Personal history of nicotine dependence: Secondary | ICD-10-CM | POA: Diagnosis not present

## 2019-04-06 HISTORY — PX: IR US GUIDE VASC ACCESS RIGHT: IMG2390

## 2019-04-06 HISTORY — PX: IR FLUORO GUIDE CV LINE RIGHT: IMG2283

## 2019-04-06 LAB — GLUCOSE, CAPILLARY
Glucose-Capillary: 119 mg/dL — ABNORMAL HIGH (ref 70–99)
Glucose-Capillary: 75 mg/dL (ref 70–99)

## 2019-04-06 LAB — CBC
HCT: 32.1 % — ABNORMAL LOW (ref 36.0–46.0)
Hemoglobin: 9.9 g/dL — ABNORMAL LOW (ref 12.0–15.0)
MCH: 29.8 pg (ref 26.0–34.0)
MCHC: 30.8 g/dL (ref 30.0–36.0)
MCV: 96.7 fL (ref 80.0–100.0)
Platelets: 202 10*3/uL (ref 150–400)
RBC: 3.32 MIL/uL — ABNORMAL LOW (ref 3.87–5.11)
RDW: 13.1 % (ref 11.5–15.5)
WBC: 7.8 10*3/uL (ref 4.0–10.5)
nRBC: 0 % (ref 0.0–0.2)

## 2019-04-06 LAB — BASIC METABOLIC PANEL
Anion gap: 10 (ref 5–15)
BUN: 59 mg/dL — ABNORMAL HIGH (ref 8–23)
CO2: 20 mmol/L — ABNORMAL LOW (ref 22–32)
Calcium: 9 mg/dL (ref 8.9–10.3)
Chloride: 111 mmol/L (ref 98–111)
Creatinine, Ser: 6.97 mg/dL — ABNORMAL HIGH (ref 0.44–1.00)
GFR calc Af Amer: 6 mL/min — ABNORMAL LOW (ref >60)
GFR calc non Af Amer: 6 mL/min — ABNORMAL LOW (ref >60)
Glucose, Bld: 109 mg/dL — ABNORMAL HIGH (ref 70–99)
Potassium: 3.5 mmol/L (ref 3.5–5.1)
Sodium: 141 mmol/L (ref 135–145)

## 2019-04-06 LAB — PROTIME-INR
INR: 1 (ref 0.8–1.2)
Prothrombin Time: 13 seconds (ref 11.4–15.2)

## 2019-04-06 MED ORDER — LIDOCAINE HCL (PF) 1 % IJ SOLN
INTRAMUSCULAR | Status: AC | PRN
  Administered 2019-04-06: 5 mL

## 2019-04-06 MED ORDER — CEFAZOLIN SODIUM-DEXTROSE 2-4 GM/100ML-% IV SOLN
INTRAVENOUS | Status: AC
  Administered 2019-04-06: 12:00:00 2 g via INTRAVENOUS
  Filled 2019-04-06: qty 100

## 2019-04-06 MED ORDER — FENTANYL CITRATE (PF) 100 MCG/2ML IJ SOLN
INTRAMUSCULAR | Status: AC | PRN
  Administered 2019-04-06: 50 ug via INTRAVENOUS

## 2019-04-06 MED ORDER — MIDAZOLAM HCL 2 MG/2ML IJ SOLN
INTRAMUSCULAR | Status: AC
  Filled 2019-04-06: qty 2

## 2019-04-06 MED ORDER — CEFAZOLIN SODIUM-DEXTROSE 2-4 GM/100ML-% IV SOLN
2.0000 g | Freq: Once | INTRAVENOUS | Status: AC
  Administered 2019-04-06: 12:00:00 2 g via INTRAVENOUS

## 2019-04-06 MED ORDER — FENTANYL CITRATE (PF) 100 MCG/2ML IJ SOLN
INTRAMUSCULAR | Status: AC
  Filled 2019-04-06: qty 2

## 2019-04-06 MED ORDER — SODIUM CHLORIDE 0.9 % IV SOLN: INTRAVENOUS | Status: DC

## 2019-04-06 MED ORDER — MIDAZOLAM HCL 2 MG/2ML IJ SOLN
INTRAMUSCULAR | Status: AC | PRN
  Administered 2019-04-06: 1 mg via INTRAVENOUS

## 2019-04-06 MED ORDER — HEPARIN SODIUM (PORCINE) 1000 UNIT/ML IJ SOLN
INTRAMUSCULAR | Status: AC
  Administered 2019-04-06: 12:00:00 3.2 mL
  Filled 2019-04-06: qty 1

## 2019-04-06 MED ORDER — LIDOCAINE HCL 1 % IJ SOLN
INTRAMUSCULAR | Status: AC
  Filled 2019-04-06: qty 20

## 2019-04-06 NOTE — H&P (Signed)
Chief Complaint: Patient was seen in consultation today for renal failure  Referring Physician(s): Leona S  Supervising Physician: Arne Cleveland  Patient Status: [facility] - Out-pt  History of Present Illness: [name] is a 68 y.o. female with past medical history of CAD, DM, HTN, HLD, MI, seizures, kidney cancer, chronic kidney disease who presents for dialysis catheter placement at the request of Dr. [name].  Patient presents in her usual state of health today. Denies fever, chills, abdominal pain, nausea, vomiting, dysuria. She has been NPO.  She does not take blood thinners.   Past Medical History:  Diagnosis Date  . Arthritis   . CAD (coronary artery disease)    Cath 2008 total RCA with collaterals  . Cancer ([place])    kidney  . Chronic kidney disease 08/2011   kidney cancer  . Diabetes mellitus   . Glaucoma   . HTN (hypertension), benign   . Hyperlipidemia   . Myocardial infarction ([place]) 2000, 2007  . Neuromuscular disorder ([place])    neck & bilateral hands  . Pneumonia 1990  . Seizures ([place]) 40 years ago   pre eclampsia  . Shortness of breath     Past Surgical History:  Procedure Laterality Date  . ABDOMINAL HYSTERECTOMY    . nacrotizing faschitis    . NECK SURGERY    . ROTATOR CUFF REPAIR     right arm    Allergies: Insect extract allergy skin test, Other, Propine [dipivefrin], Travatan [travoprost], and Xalatan [latanoprost]  Medications: Prior to Admission medications   Medication Sig Start Date End Date Taking? Authorizing Provider  aspirin EC 81 MG tablet Take 81 mg by mouth daily.   Yes [provider]  Coenzyme Q10 (CO Q 10) 100 MG CAPS Take 1 tablet by mouth at bedtime.   Yes [provider]  ezetimibe-simvastatin (VYTORIN) 10-10 MG per tablet Take 1 tablet by mouth at bedtime.     Yes [provider]  furosemide (LASIX) 40 MG tablet Take by mouth. 10/01/18  Yes [provider]  insulin glargine  (LANTUS) 100 UNIT/ML injection Inject 50 Units into the skin at bedtime.    Yes [provider]  isosorbide mononitrate (IMDUR) 30 MG 24 hr tablet Take 30 mg by mouth daily.     Yes [provider]  metoprolol (TOPROL-XL) 100 MG 24 hr tablet Take 100 mg by mouth daily.     Yes [provider]  Multiple Vitamin (MULTIVITAMIN) tablet Take 1 tablet by mouth daily.    Yes [provider]  oxymetazoline (AFRIN) 0.05 % nasal spray Place 2 sprays into the nose 2 (two) times daily.   Yes [provider]  ramipril (ALTACE) 10 MG capsule Take 10 mg by mouth daily.     Yes [provider]  Spirulina 500 MG TABS Take 500 mg by mouth 3 (three) times daily.   Yes [provider]  HYDROmorphone (DILAUDID) 4 MG tablet Take 4 mg by mouth every 4 (four) hours as needed for severe pain.    [provider]  nitroGLYCERIN (NITROSTAT) 0.4 MG SL tablet Place 0.4 mg under the tongue every 5 (five) minutes as needed for chest pain. 07/18/11   Josue Hector, MD  senna (SENOKOT) 8.6 MG tablet Take 1 tablet by mouth as needed for constipation.     [provider]  sodium bicarbonate 650 MG tablet Take by mouth. 10/01/18   [provider]     Family History  Problem Relation Age of Onset  . Heart attack Father     Social History   Socioeconomic History  . Marital status: Single    Spouse name: Not on file  . Number of children: Not on file  . Years of education: Not on file  . Highest education level: Not on file  Occupational History  . Not on file  Social Needs  . Financial resource strain: Not on file  . Food insecurity    Worry: Not on file    Inability: Not on file  . Transportation needs    Medical: Not on file    Non-medical: Not on file  Tobacco Use  . Smoking status: Former Smoker    Quit date: 05/08/1999    Years since quitting: 19.9  . Smokeless tobacco: Never Used  Substance and Sexual Activity  . Alcohol  use: No    Comment: rarely drinks glass of wine  . Drug use: No  . Sexual activity: Not on file  Lifestyle  . Physical activity    Days per week: Not on file    Minutes per session: Not on file  . Stress: Not on file  Relationships  . Social Herbalist on phone: Not on file    Gets together: Not on file    Attends religious service: Not on file    Active member of club or organization: Not on file    Attends meetings of clubs or organizations: Not on file    Relationship status: Not on file  Other Topics Concern  . Not on file  Social History Narrative   Single   Sedentary   Former smoker   No ETOH     Review of Systems: A 12 point ROS discussed and pertinent positives are indicated in the HPI above.  All other systems are negative.  Review of Systems  Constitutional: Negative for fatigue and fever.  Respiratory: Negative for cough and shortness of breath.   Cardiovascular: Negative for chest pain.  Gastrointestinal: Negative for abdominal pain.  Genitourinary: Negative for dysuria.  Musculoskeletal: Negative for back pain.  Psychiatric/Behavioral: Negative for behavioral problems and confusion.    Vital Signs: BP (!) 152/63   Pulse 78   Temp 98 F (36.7 C) (Skin)   Resp 16   Ht 4' 11.5" (1.511 m)   Wt 175 lb (79.4 kg)   SpO2 100%   BMI 34.75 kg/m   Physical Exam Vitals signs and nursing note reviewed.  Constitutional:      Appearance: Normal appearance.  HENT:     Mouth/Throat:     Mouth: Mucous membranes are moist.     Pharynx: Oropharynx is clear.  Neck:     Musculoskeletal: Normal range of motion and neck supple. No muscular tenderness.  Cardiovascular:     Rate and Rhythm: Normal rate and regular rhythm.  Pulmonary:     Effort: Pulmonary effort is normal. No respiratory distress.     Breath sounds: Normal breath sounds.  Abdominal:     General: Abdomen is flat.     Palpations: Abdomen is soft.  Skin:    General: Skin is warm and  dry.  Neurological:     General: No focal deficit present.     Mental Status: She is alert and oriented to person, place, and time. Mental status is at baseline.  Psychiatric:        Mood and Affect: Mood normal.  Behavior: Behavior normal.        Thought Content: Thought content normal.        Judgment: Judgment normal.      MD Evaluation Airway: WNL Heart: WNL Abdomen: WNL Chest/ Lungs: WNL ASA  Classification: 3 Mallampati/Airway Score: Two   Imaging: No results found.  Labs:  CBC: Recent Labs    04/06/19 0950  WBC 7.8  HGB 9.9*  HCT 32.1*  PLT 202    COAGS: No results for input(s): INR, APTT in the last 8760 hours.  BMP: No results for input(s): NA, K, CL, CO2, GLUCOSE, BUN, CALCIUM, CREATININE, GFRNONAA, GFRAA in the last 8760 hours.  Invalid input(s): CMP  LIVER FUNCTION TESTS: No results for input(s): BILITOT, AST, ALT, ALKPHOS, PROT, ALBUMIN in the last 8760 hours.  TUMOR MARKERS: No results for input(s): AFPTM, CEA, CA199, CHROMGRNA in the last 8760 hours.  Assessment and Plan: Patient with past medical history of kidney cancer, chronic kidney disease presents with complaint of renal failure with anticipated need of dialysis soon.  IR consulted for tunneled dialysis catheter placement at the request of Dr. [name]. Case reviewed by Dr. Vernard Gambles who approves patient for procedure.  Patient presents today in their usual state of health.  She has been NPO and is not currently on blood thinners.  She does not currently have an appointment for dialysis.  She is instructed to contact her Nephrologist and maintain close follow-up.  Risks and benefits discussed with the patient including, but not limited to bleeding, infection, vascular injury, pneumothorax which may require chest tube placement, air embolism or even death  All of the patient's questions were answered, patient is agreeable to proceed. Consent signed and in chart.  Thank you for  this interesting consult.  I greatly enjoyed meeting TAMURA LASKY and look forward to participating in their care.  A copy of this report was sent to the requesting provider on this date.  Electronically Signed: Docia Barrier, PA 04/06/2019, 10:41 AM   I spent a total of  30 Minutes   in face to face in clinical consultation, greater than 50% of which was counseling/coordinating care for renal failure.

## 2019-04-06 NOTE — Progress Notes (Signed)
Instructions given. Blue clamps sent home with patient.

## 2019-04-06 NOTE — Procedures (Signed)
  Procedure: R IJ HD catheter tunneled Palindrome 19 EBL:   minimal Complications:  none immediate  See full dictation in [facility].  Dillard Cannon MD Main # [phone] Pager  [phone]

## 2019-04-06 NOTE — Discharge Instructions (Signed)

## 2019-05-15 ENCOUNTER — Other Ambulatory Visit (HOSPITAL_COMMUNITY): Payer: Medicare Other

## 2019-05-15 ENCOUNTER — Encounter (HOSPITAL_COMMUNITY): Payer: Medicare Other

## 2019-05-15 ENCOUNTER — Encounter: Payer: Medicare Other | Admitting: Vascular Surgery

## 2019-05-22 ENCOUNTER — Other Ambulatory Visit (HOSPITAL_COMMUNITY): Payer: Self-pay | Admitting: Nephrology

## 2019-05-22 DIAGNOSIS — N186 End stage renal disease: Secondary | ICD-10-CM

## 2019-05-27 ENCOUNTER — Other Ambulatory Visit: Payer: Self-pay | Admitting: Radiology

## 2019-05-28 ENCOUNTER — Encounter (HOSPITAL_COMMUNITY): Payer: Self-pay

## 2019-05-28 ENCOUNTER — Ambulatory Visit (HOSPITAL_COMMUNITY): Admission: RE | Admit: 2019-05-28 | Payer: Medicare Other | Source: Ambulatory Visit

## 2019-06-03 ENCOUNTER — Telehealth: Payer: Self-pay | Admitting: *Deleted

## 2019-06-03 NOTE — Telephone Encounter (Signed)
   [facility] Pre-operative Risk Assessment    Request for surgical clearance:  1. What type of surgery is being performed? LAP PD CATH, OMENTOPEXY, POSSIBLE OPEN   2. When is this surgery scheduled? 06/23/19   3. What type of clearance is required (medical clearance vs. Pharmacy clearance to hold med vs. Both)? MEDICAL  4. Are there any medications that need to be held prior to surgery and how long? ASA    5. Practice name and name of physician performing surgery? [facility]; DR. Raul Del   6. What is your office phone number [phone]    7.   What is your office fax number [phone]  8.   Anesthesia type (None, local, MAC, general) ? NOT LISTED   [name] 06/03/2019, 2:44 PM  _________________________________________________________________   (provider comments below)

## 2019-06-03 NOTE — Telephone Encounter (Signed)
   Primary Cardiologist:Peter [name], MD  Chart reviewed as part of pre-operative protocol coverage. Because of [name]'s past medical history and time since last visit, he/she will require a follow-up visit in order to better assess preoperative cardiovascular risk.  Pre-op covering staff: - Please schedule appointment and call patient to inform them. - Please contact requesting surgeon's office via preferred method (i.e, phone, fax) to inform them of need for appointment prior to surgery.  If applicable, this message will also be routed to pharmacy pool and/or primary cardiologist for input on holding anticoagulant/antiplatelet agent as requested below so that this information is available at time of patient's appointment.   [place], [place]  06/03/2019, 3:39 PM

## 2019-06-03 NOTE — Telephone Encounter (Signed)
Ok to hold ASA 5-7 days before PD catheter surgery

## 2019-06-03 NOTE — Telephone Encounter (Signed)
Pt is agreeable to appt for pre op clearance appt. Pt has been scheduled to see Pecolia Ades, NP 06/12/19.  I will forward clearance note to Pecolia Ades, NP for upcoming appt. I will send FYI to surgeon pt has appt , once cleared we will send over clearance notes.

## 2019-06-08 DIAGNOSIS — Z4902 Encounter for fitting and adjustment of peritoneal dialysis catheter: Secondary | ICD-10-CM | POA: Insufficient documentation

## 2019-06-11 NOTE — Progress Notes (Signed)
Virtual Visit via Telephone Note   This visit type was conducted due to national recommendations for restrictions regarding the COVID-19 Pandemic (e.g. social distancing) in an effort to limit this patient's exposure and mitigate transmission in our community.  Due to her co-morbid illnesses, this patient is at least at moderate risk for complications without adequate follow up.  This format is felt to be most appropriate for this patient at this time.  The patient did not have access to video technology/had technical difficulties with video requiring transitioning to audio format only (telephone).  All issues noted in this document were discussed and addressed.  No physical exam could be performed with this format.  Please refer to the patient's chart for her  consent to telehealth for [facility].   Date:  06/12/2019   ID:  [name], DOB [birth year], MRN [id number]  Patient Location: Other:  Joneen Roach of our [place] office Provider Location: Office  PCP:  Lucia Gaskins, MD  Cardiologist:  Jenkins Rouge, MD  Electrophysiologist:  None   Evaluation Performed:  Follow-Up Visit  Chief Complaint:  Preop clearance  History of Present Illness:    [name] is a 69 y.o. female with a past medical history significant for CAD, MI in 2000 and 2007, hyperlipidemia, hypertension, diabetes type 2, CKD, kidney cancer, anemia of chronic disease and arthritis.  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  She was hospitalized for pancreatitis in 2013.  CT showed left renal mass 3.7 cm.  She had a subsequent left nephrectomy.  Her last Myoview in 12/2016 was low risk with EF 50%.  She was last seen in the office by Dr. [name] on 11/18/2018 at which time she was doing well from a cardiac standpoint.  She was noted to have worsening renal function and was referred to nephrology.  She did not want to be  on dialysis and noted that she had lived twice as long as her mother already.  She had some lower extremity edema, on Lasix and advised could take an extra dose in the afternoon if needed.  The patient is being seen today for preop clearance for laparoscopic PD catheter, omentopexy, possible open procedure scheduled for 06/23/2019. She was to be seen in our [employer] by me, however she arrived at the [place] office.  I am having an EKG done in [place] and I will do a virtual visit for for clearance.  Reportedly she is having no acute issues.  Today [name] reports that she has mild DOE with more exertion like shopping and carrying groceries, doing laundry, things that she does not do every day. She is fine with her daily activities. This has been stable over time. She denies chest pain/pressure, resting shortness of breath, orthopnea, PND, palpitations, lightheadedness or syncope. She says that her edema has greatly improved since starting treatment for sleep apnea and starting dialysis. She used to have to wear slippers due to not being able to wear her shoes. She is now back into her shoes.   BP at dialysis 120-150.   She says that her weight was up to 205 pounds at the time of her evaluation for dialysis and is now down to 168.8 pounds.   The patient does not have symptoms concerning for COVID-19 infection (fever, chills, cough, or new shortness of breath).    Past Medical History:  Diagnosis Date  .  Arthritis   . CAD (coronary artery disease)    Cath 2008 total RCA with collaterals  . Cancer ([place])    kidney  . Chronic kidney disease 08/2011   kidney cancer  . Diabetes mellitus   . Glaucoma   . HTN (hypertension), benign   . Hyperlipidemia   . Myocardial infarction ([place]) 2000, 2007  . Neuromuscular disorder ([place])    neck & bilateral hands  . Pneumonia 1990  . Seizures ([place]) 40 years ago   pre eclampsia  . Shortness of breath    Past Surgical History:  Procedure  Laterality Date  . ABDOMINAL HYSTERECTOMY    . IR FLUORO GUIDE CV LINE RIGHT  04/06/2019  . IR US GUIDE VASC ACCESS RIGHT  04/06/2019  . nacrotizing faschitis    . NECK SURGERY    . ROTATOR CUFF REPAIR     right arm     Current Meds  Medication Sig  . amLODipine (NORVASC) 10 MG tablet Take 1 tablet by mouth daily.  Marland Kitchen aspirin EC 81 MG tablet Take 81 mg by mouth daily.  . Coenzyme Q10 (CO Q 10) 100 MG CAPS Take 1 tablet by mouth at bedtime.  Marland Kitchen ezetimibe-simvastatin (VYTORIN) 10-20 MG tablet Take 1 tablet by mouth at bedtime.  . furosemide (LASIX) 40 MG tablet Take by mouth.  Marland Kitchen HYDROmorphone (DILAUDID) 4 MG tablet Take 4 mg by mouth every 4 (four) hours as needed for severe pain.  Marland Kitchen insulin glargine (LANTUS) 100 UNIT/ML injection Inject 30 Units into the skin at bedtime.   . isosorbide mononitrate (IMDUR) 30 MG 24 hr tablet Take 30 mg by mouth daily.    . metoprolol (TOPROL-XL) 100 MG 24 hr tablet Take 100 mg by mouth daily.    . Multiple Vitamin (MULTIVITAMIN) tablet Take 1 tablet by mouth daily.   . nitroGLYCERIN (NITROSTAT) 0.4 MG SL tablet Place 0.4 mg under the tongue every 5 (five) minutes as needed for chest pain.  Marland Kitchen oxymetazoline (AFRIN) 0.05 % nasal spray Place 2 sprays into the nose 2 (two) times daily.  . ramipril (ALTACE) 10 MG capsule Take 10 mg by mouth daily.    Marland Kitchen senna (SENOKOT) 8.6 MG tablet Take 1 tablet by mouth as needed for constipation.   . sodium bicarbonate 650 MG tablet Take by mouth.  . Spirulina 500 MG TABS Take 500 mg by mouth 3 (three) times daily.  . [DISCONTINUED] ezetimibe-simvastatin (VYTORIN) 10-10 MG per tablet Take 1 tablet by mouth at bedtime.       Allergies:   Insect extract allergy skin test, Other, Propine [dipivefrin], Travatan [travoprost], and Xalatan [latanoprost]   Social History   Tobacco Use  . Smoking status: Former Smoker    Quit date: 05/08/1999    Years since quitting: 20.1  . Smokeless tobacco: Never Used  Substance Use Topics   . Alcohol use: No    Comment: rarely drinks glass of wine  . Drug use: No     Family Hx: The patient's family history includes Heart attack in her father.  ROS:   Please see the history of present illness.     All other systems reviewed and are negative.   Prior CV studies:   The following studies were reviewed today:  Myocardial perfusion imaging 12/18/2016  There was no ST segment deviation noted during stress.  Defect 1: There is a medium defect of moderate severity present in the basal anteroseptal, basal inferior, basal inferolateral, mid anteroseptal, mid inferior, mid inferolateral  and apical inferior location, consistent with myocardial scar.  Findings consistent with prior myocardial infarction with a minimal degree of inferoapical peri-infarct ischemia.  This is a low risk study.  Nuclear stress EF: 50%.     Labs/Other Tests and Data Reviewed:    EKG:  An ECG dated 06/12/2019 was personally reviewed today and demonstrated:  NSR with old anterior MI, 81 BPM, QTC 448. No significant change from previous EKGs  Recent Labs: 04/06/2019: BUN 59; Creatinine, Ser 6.97; Hemoglobin 9.9; Platelets 202; Potassium 3.5; Sodium 141   Recent Lipid Panel Lab Results  Component Value Date/Time   CHOL 221 06/07/2009 12:00 AM   TRIG 485 06/07/2009 12:00 AM   HDL 31 06/07/2009 12:00 AM    Wt Readings from Last 3 Encounters:  06/12/19 168 lb 12.8 oz (76.6 kg)  04/06/19 175 lb (79.4 kg)  11/18/18 175 lb (79.4 kg)     Objective:    Vital Signs:  BP (!) 144/71   Pulse 80   Wt 168 lb 12.8 oz (76.6 kg)   SpO2 96%   BMI 33.52 kg/m    VITAL SIGNS:  reviewed GEN:  no acute distress RESPIRATORY:  No wheezes or cough. Able to speak in complete sentences.  NEURO:  alert and oriented x 3, no obvious focal deficit PSYCH:  normal affect  ASSESSMENT & PLAN:     Preop clearance for PD catheter placement -Patient with remote MI and stenting.  Has been stable for many years.   Last Myoview was low risk in 2018. -The patient has a mildly elevated surgical risk due to her renal impairment and prior history of CAD which is now been stable for many years, however, she is clinically very stable. -Patient may proceed with her surgery with no need for further cardiac testing. Also reviewed with Dr. [name].  -Okay to hold aspirin for 5-7 days prior to procedure.  Resume after procedure when okay with surgeon. -I will efax this clearance note to the requesting provider.   ESRD -Now advanced.  Patient is status post left nephrectomy. -Followed by Dr. [name], [facility] started on hemodialysis in 04/2019 with plans for peritoneal dialysis.  -Still takes lasix, still makes some urine  CAD -History of chronic total occlusion of the RCA with collaterals by cath in 2008.  Low risk Myoview in 2013.  Low risk Myoview in 2018 with inferior/inferior lateral scar, no significant ischemia, EF 50%. -Patient continues on medical therapy with aspirin 81 mg, statin, beta-blocker, long-acting nitrate, ACE inhibitor  Hypertension -On metoprolol, ramipril, Imdur, amlodipine -Blood pressure is mildly elevated today.  Patient reports that blood pressures are good at dialysis.  Current management per nephrology.  Hyperlipidemia, LDL goal <70 -On Vytorin.  Management per PCP.  Diabetes on insulin -Followed by her PCP. -She reports being on keto diet for the last 3 years, although she does eat some bread. She reports better blood sugar control over the last year.    COVID-19 Education: The signs and symptoms of COVID-19 were discussed with the patient and how to seek care for testing (follow up with PCP or arrange E-visit).  The importance of social distancing was discussed today.  Time:   Today, I have spent 16 minutes with the patient with telehealth technology discussing the above problems.     Medication Adjustments/Labs and Tests Ordered: Current medicines are reviewed at  length with the patient today.  Concerns regarding medicines are outlined above.   Tests Ordered: No orders of the  defined types were placed in this encounter.   Medication Changes: No orders of the defined types were placed in this encounter.   Follow Up:  Either In Person or Virtual in 6 month(s)  Signed, Daune Perch, NP  06/12/2019 9:23 AM    [place]

## 2019-06-12 ENCOUNTER — Other Ambulatory Visit (INDEPENDENT_AMBULATORY_CARE_PROVIDER_SITE_OTHER): Payer: Medicare Other | Admitting: *Deleted

## 2019-06-12 ENCOUNTER — Telehealth (INDEPENDENT_AMBULATORY_CARE_PROVIDER_SITE_OTHER): Payer: Medicare Other | Admitting: Cardiology

## 2019-06-12 ENCOUNTER — Ambulatory Visit: Payer: Medicare Other

## 2019-06-12 ENCOUNTER — Other Ambulatory Visit: Payer: Self-pay

## 2019-06-12 ENCOUNTER — Encounter: Payer: Self-pay | Admitting: Cardiology

## 2019-06-12 VITALS — BP 144/71 | HR 80 | Wt 168.8 lb

## 2019-06-12 DIAGNOSIS — I10 Essential (primary) hypertension: Secondary | ICD-10-CM | POA: Diagnosis not present

## 2019-06-12 DIAGNOSIS — Z01810 Encounter for preprocedural cardiovascular examination: Secondary | ICD-10-CM

## 2019-06-12 NOTE — Patient Instructions (Signed)
Medication Instructions:  Your physician recommends that you continue on your current medications as directed. Please refer to the Current Medication list given to you today.  *If you need a refill on your cardiac medications before your next appointment, please call your pharmacy*  Lab Work: None ordered   If you have labs (blood work) drawn today and your tests are completely normal, you will receive your results only by: Marland Kitchen MyChart Message (if you have MyChart) OR . A paper copy in the mail If you have any lab test that is abnormal or we need to change your treatment, we will call you to review the results.  Testing/Procedures: None ordered   Follow-Up: At [facility], you and your health needs are our priority.  As part of our continuing mission to provide you with exceptional heart care, we have created designated Provider Care Teams.  These Care Teams include your primary Cardiologist (physician) and Advanced Practice Providers (APPs -  Physician Assistants and Nurse Practitioners) who all work together to provide you with the care you need, when you need it.  Your next appointment:   6 month(s)  The format for your next appointment:   In Person  Provider:   Dr. [name] in [place]   Other Instructions None

## 2019-06-26 ENCOUNTER — Encounter: Payer: Self-pay | Admitting: Vascular Surgery

## 2019-06-26 ENCOUNTER — Other Ambulatory Visit: Payer: Self-pay

## 2019-06-26 ENCOUNTER — Ambulatory Visit (HOSPITAL_COMMUNITY)
Admission: RE | Admit: 2019-06-26 | Discharge: 2019-06-26 | Disposition: A | Discharge status: Home / Self Care | Payer: Medicare Other | Source: Ambulatory Visit | Attending: Surgery | Admitting: Surgery

## 2019-06-26 ENCOUNTER — Ambulatory Visit (INDEPENDENT_AMBULATORY_CARE_PROVIDER_SITE_OTHER)
Admission: RE | Admit: 2019-06-26 | Discharge: 2019-06-26 | Disposition: A | Discharge status: Home / Self Care | Payer: Medicare Other | Source: Ambulatory Visit | Attending: Surgery | Admitting: Surgery

## 2019-06-26 ENCOUNTER — Ambulatory Visit (INDEPENDENT_AMBULATORY_CARE_PROVIDER_SITE_OTHER): Payer: Medicare Other | Admitting: Vascular Surgery

## 2019-06-26 VITALS — BP 143/81 | HR 77 | Temp 97.4°F | Resp 20 | Ht 59.5 in | Wt 177.7 lb

## 2019-06-26 DIAGNOSIS — N185 Chronic kidney disease, stage 5: Secondary | ICD-10-CM

## 2019-06-26 DIAGNOSIS — N186 End stage renal disease: Secondary | ICD-10-CM | POA: Diagnosis not present

## 2019-06-26 NOTE — Progress Notes (Signed)
Patient ID: [name], female   DOB: 05-30-1950, 69 y.o.   MRN: [id number]  Reason for Consult: New Patient (Initial Visit)   Referred by Lucia Gaskins, MD  Subjective:     HPI:  [name] is a 69 y.o. female with end-stage renal disease currently on dialysis via catheter.  She has never had any other chest, breast or upper extremity surgery other than rotator cuff on the right.  She does have some swelling in the right upper extremity.  She is right-hand dominant.  No previous dialysis access surgery.  Past Medical History:  Diagnosis Date  . Arthritis   . CAD (coronary artery disease)    Cath 2008 total RCA with collaterals  . Cancer ([place])    kidney  . Chronic kidney disease 08/2011   kidney cancer  . Diabetes mellitus   . ESRD (end stage renal disease) ([place])    On dialysis as of 04/2019  . Glaucoma   . HTN (hypertension), benign   . Hyperlipidemia   . Myocardial infarction ([place]) 2000, 2007  . Neuromuscular disorder ([place])    neck & bilateral hands  . Pneumonia 1990  . Seizures ([place]) 40 years ago   pre eclampsia  . Shortness of breath    Family History  Problem Relation Age of Onset  . Heart attack Father    Past Surgical History:  Procedure Laterality Date  . ABDOMINAL HYSTERECTOMY    . IR FLUORO GUIDE CV LINE RIGHT  04/06/2019  . IR US GUIDE VASC ACCESS RIGHT  04/06/2019  . nacrotizing faschitis    . NECK SURGERY    . ROTATOR CUFF REPAIR     right arm    Short Social History:  Social History   Tobacco Use  . Smoking status: Former Smoker    Quit date: 05/08/1999    Years since quitting: 20.1  . Smokeless tobacco: Never Used  Substance Use Topics  . Alcohol use: No    Comment: rarely drinks glass of wine    Allergies  Allergen Reactions  . Insect Extract Allergy Skin Test Swelling  . Other     Optical meds unknown to pt  . Propine [Dipivefrin] Itching and Other (See Comments)    Makes eyes turn a reddish orange color.  . Travatan  [Travoprost] Itching and Other (See Comments)    Makes eyes turn a reddish orange color  . Xalatan [Latanoprost] Itching and Other (See Comments)    Makes eyes turn a reddish orange color.     Current Outpatient Medications  Medication Sig Dispense Refill  . amLODipine (NORVASC) 10 MG tablet Take 1 tablet by mouth daily.    Marland Kitchen aspirin EC 81 MG tablet Take 81 mg by mouth daily.    . B Complex-C (B-COMPLEX WITH VITAMIN C) tablet Take by mouth.    . Coenzyme Q10 (CO Q 10) 100 MG CAPS Take 1 tablet by mouth at bedtime.    Marland Kitchen ezetimibe-simvastatin (VYTORIN) 10-20 MG tablet Take 1 tablet by mouth at bedtime.    . furosemide (LASIX) 40 MG tablet Take by mouth.    Marland Kitchen HYDROmorphone (DILAUDID) 4 MG tablet Take 4 mg by mouth every 4 (four) hours as needed for severe pain.    Marland Kitchen insulin glargine (LANTUS) 100 UNIT/ML injection Inject 30 Units into the skin at bedtime.     . isosorbide mononitrate (IMDUR) 30 MG 24 hr tablet Take 30 mg by mouth daily.      Marland Kitchen  metoprolol (TOPROL-XL) 100 MG 24 hr tablet Take 100 mg by mouth daily.      . Multiple Vitamin (MULTIVITAMIN) tablet Take 1 tablet by mouth daily.     . nitroGLYCERIN (NITROSTAT) 0.4 MG SL tablet Place 0.4 mg under the tongue every 5 (five) minutes as needed for chest pain.    . ramipril (ALTACE) 10 MG capsule Take 10 mg by mouth daily.      Marland Kitchen senna (SENOKOT) 8.6 MG tablet Take 1 tablet by mouth as needed for constipation.     Marland Kitchen Spirulina 500 MG TABS Take 500 mg by mouth 3 (three) times daily.    Marland Kitchen oxymetazoline (AFRIN) 0.05 % nasal spray Place 2 sprays into the nose 2 (two) times daily.    . sodium bicarbonate 650 MG tablet Take by mouth.     No current facility-administered medications for this visit.    REVIEW OF SYSTEMS      Objective:  Objective  Vitals:   06/26/19 1418  BP: (!) 143/81  Pulse: 77  Resp: 20  Temp: (!) 97.4 F (36.3 C)  SpO2: 95%      Physical Exam Constitutional:      Appearance: Normal appearance.  HENT:      Head: Normocephalic.     Nose:     Comments: Mask in place Eyes:     Pupils: Pupils are equal, round, and reactive to light.  Cardiovascular:     Rate and Rhythm: Normal rate.     Pulses:          Radial pulses are 2+ on the right side and 2+ on the left side.  Pulmonary:     Effort: Pulmonary effort is normal.  Abdominal:     General: Abdomen is flat.     Palpations: Abdomen is soft.  Musculoskeletal:        General: No swelling. Normal range of motion.  Skin:    General: Skin is warm and dry.     Capillary Refill: Capillary refill takes less than 2 seconds.  Neurological:     General: No focal deficit present.     Mental Status: She is alert.  Psychiatric:        Mood and Affect: Mood normal.        Behavior: Behavior normal.        Thought Content: Thought content normal.        Judgment: Judgment normal.     Data: I have independently interpreted her bilateral extremity vein mapping which does not demonstrate any suitable vein in the upper extremities for dialysis access creation.  I have independently interpreted her upper extremity arterial duplexes which demonstrate triphasic waveforms throughout.  Right side brachial artery 0.41 cm and left 0.38 cm.     Assessment/Plan:     69 year old female on dialysis via right IJ tunnel catheter.  I discussed her ultrasound findings today and she would only be candidate for graft placement in her nondominant left upper extremity.  At this time patient does not want any further surgery states that she is okay if she comes off of dialysis in the future.  She will continue dialysis via catheter.  If she changes her mind we can schedule her without further appointment for left upper extremity likely brachial artery to axillary vein graft.     Waynetta Sandy MD [facility]

## 2019-07-20 ENCOUNTER — Ambulatory Visit: Payer: Medicare Other | Attending: Internal Medicine

## 2019-07-20 DIAGNOSIS — Z23 Encounter for immunization: Secondary | ICD-10-CM

## 2019-07-20 NOTE — Progress Notes (Signed)
   Covid-19 Vaccination Clinic  Name:  [name]    MRN: [id number] DOB: 07/16/1950  07/20/2019  Ms. [name] was observed post Covid-19 immunization for 15 minutes without incident. She was provided with Vaccine Information Sheet and instruction to access the V-Safe system.   Ms. [name] was instructed to call 911 with any severe reactions post vaccine: Marland Kitchen Difficulty breathing  . Swelling of face and throat  . A fast heartbeat  . A bad rash all over body  . Dizziness and weakness   Immunizations Administered    Name Date Dose VIS Date Route   Pfizer COVID-19 Vaccine 07/20/2019 12:23 PM 0.3 mL 04/17/2019 Intramuscular   Manufacturer: [place]   Lot: [id number]   [place]: [id number]

## 2019-07-23 ENCOUNTER — Other Ambulatory Visit (HOSPITAL_COMMUNITY): Payer: Self-pay | Admitting: Nephrology

## 2019-07-23 DIAGNOSIS — N186 End stage renal disease: Secondary | ICD-10-CM

## 2019-07-30 ENCOUNTER — Ambulatory Visit (HOSPITAL_COMMUNITY): Admission: RE | Admit: 2019-07-30 | Payer: Medicare Other | Source: Ambulatory Visit

## 2019-07-30 ENCOUNTER — Encounter (HOSPITAL_COMMUNITY): Payer: Self-pay

## 2019-08-04 ENCOUNTER — Encounter (INDEPENDENT_AMBULATORY_CARE_PROVIDER_SITE_OTHER): Payer: Self-pay | Admitting: Vascular Surgery

## 2019-08-04 ENCOUNTER — Ambulatory Visit (INDEPENDENT_AMBULATORY_CARE_PROVIDER_SITE_OTHER): Payer: Medicare Other | Admitting: Vascular Surgery

## 2019-08-04 ENCOUNTER — Other Ambulatory Visit: Payer: Self-pay

## 2019-08-04 VITALS — BP 177/81 | HR 91 | Resp 16 | Wt 181.9 lb

## 2019-08-04 DIAGNOSIS — I10 Essential (primary) hypertension: Secondary | ICD-10-CM

## 2019-08-04 DIAGNOSIS — N185 Chronic kidney disease, stage 5: Secondary | ICD-10-CM | POA: Diagnosis not present

## 2019-08-04 DIAGNOSIS — E1122 Type 2 diabetes mellitus with diabetic chronic kidney disease: Secondary | ICD-10-CM | POA: Diagnosis not present

## 2019-08-04 DIAGNOSIS — E782 Mixed hyperlipidemia: Secondary | ICD-10-CM

## 2019-08-04 DIAGNOSIS — Z794 Long term (current) use of insulin: Secondary | ICD-10-CM

## 2019-08-04 NOTE — Progress Notes (Signed)
Patient ID: [name], female   DOB: 1950-11-05, 69 y.o.   MRN: [id number]  Chief Complaint  Patient presents with  . New Patient (Initial Visit)    ref Theador [name] eval for PD Cath    HPI [name] is a 69 y.o. female.  I am asked to see the patient by Dr. Theador [name] for evaluation of dialysis access in the form of peritoneal dialysis catheter.  The patient has a PermCath in place but has not needed it for the past few weeks.  That being said she has advanced chronic kidney disease would likely benefit from dialysis.  She is getting some fluid retention with swelling at this point.  She hates how hemodialysis makes her feel and they are unable to pull enough fluid off on hemodialysis.  She also has limited access options with no superficial veins in her arms.  She has been told she can have an AV graft in the left arm but that would be it.  She does not want to have a graft having no many people who have had those and really not wanting to have that done.  She had an assessment for peritoneal dialysis catheter in [facility] but was told she had abdominal scar tissue which may preclude this.  She has had a previous hysterectomy as well as a nephrectomy done laparoscopically.  Her energy level is fairly low from her advanced chronic kidney disease.  No fevers or chills.  No current abdominal pain.  She understands the complexity and grave nature of the situation.  She describes having had necrotizing fasciitis, severe eclampsia, and renal cancer and understands that this could be a terminal issue if durable dialysis access cannot be obtained.   Past Medical History:  Diagnosis Date  . Arthritis   . CAD (coronary artery disease)    Cath 2008 total RCA with collaterals  . Cancer ([place])    kidney  . Chronic kidney disease 08/2011   kidney cancer  . Diabetes mellitus   . ESRD (end stage renal disease) ([place])    On dialysis as of 04/2019  . Glaucoma   . HTN (hypertension), benign   .  Hyperlipidemia   . Myocardial infarction ([place]) 2000, 2007  . Neuromuscular disorder ([place])    neck & bilateral hands  . Pneumonia 1990  . Seizures ([place]) 40 years ago   pre eclampsia  . Shortness of breath     Past Surgical History:  Procedure Laterality Date  . ABDOMINAL HYSTERECTOMY    . IR FLUORO GUIDE CV LINE RIGHT  04/06/2019  . IR US GUIDE VASC ACCESS RIGHT  04/06/2019  . nacrotizing faschitis    . NECK SURGERY    . ROTATOR CUFF REPAIR     right arm     Family History  Problem Relation Age of Onset  . Heart attack Father   No bleeding disorders, clotting disorders, autoimmune diseases, or aneurysms  Social History   Tobacco Use  . Smoking status: Former Smoker    Quit date: 05/08/1999    Years since quitting: 20.2  . Smokeless tobacco: Never Used  Substance Use Topics  . Alcohol use: No    Comment: rarely drinks glass of wine  . Drug use: No     Allergies  Allergen Reactions  . Insect Extract Allergy Skin Test Swelling  . Other     Optical meds unknown to pt  . Propine [Dipivefrin] Itching and Other (See Comments)  Makes eyes turn a reddish orange color.  . Travatan [Travoprost] Itching and Other (See Comments)    Makes eyes turn a reddish orange color  . Xalatan [Latanoprost] Itching and Other (See Comments)    Makes eyes turn a reddish orange color.     Current Outpatient Medications  Medication Sig Dispense Refill  . amLODipine (NORVASC) 10 MG tablet Take 1 tablet by mouth daily.    Marland Kitchen aspirin EC 81 MG tablet Take 81 mg by mouth daily.    . B Complex-C (B-COMPLEX WITH VITAMIN C) tablet Take by mouth.    . Coenzyme Q10 (CO Q 10) 100 MG CAPS Take 1 tablet by mouth at bedtime.    Marland Kitchen ezetimibe-simvastatin (VYTORIN) 10-20 MG tablet Take 1 tablet by mouth at bedtime.    . furosemide (LASIX) 40 MG tablet Take by mouth.    Marland Kitchen HYDROmorphone (DILAUDID) 4 MG tablet Take 4 mg by mouth every 4 (four) hours as needed for severe pain.    Marland Kitchen insulin glargine (LANTUS)  100 UNIT/ML injection Inject 30 Units into the skin at bedtime.     . isosorbide mononitrate (IMDUR) 30 MG 24 hr tablet Take 30 mg by mouth daily.      . metoprolol (TOPROL-XL) 100 MG 24 hr tablet Take 100 mg by mouth daily.      . Multiple Vitamin (MULTIVITAMIN) tablet Take 1 tablet by mouth daily.     . nitroGLYCERIN (NITROSTAT) 0.4 MG SL tablet Place 0.4 mg under the tongue every 5 (five) minutes as needed for chest pain.    Marland Kitchen oxymetazoline (AFRIN) 0.05 % nasal spray Place 2 sprays into the nose 2 (two) times daily.    . ramipril (ALTACE) 10 MG capsule Take 10 mg by mouth daily.      Marland Kitchen senna (SENOKOT) 8.6 MG tablet Take 1 tablet by mouth as needed for constipation.     . sodium bicarbonate 650 MG tablet Take by mouth.    . Spirulina 500 MG TABS Take 500 mg by mouth 3 (three) times daily.     No current facility-administered medications for this visit.      REVIEW OF SYSTEMS (Negative unless checked)  Constitutional: [] Weight loss  [] Fever  [] Chills Cardiac: [] Chest pain   [] Chest pressure   [] Palpitations   [] Shortness of breath when laying flat   [] Shortness of breath at rest   [x] Shortness of breath with exertion. Vascular:  [] Pain in legs with walking   [] Pain in legs at rest   [] Pain in legs when laying flat   [] Claudication   [] Pain in feet when walking  [] Pain in feet at rest  [] Pain in feet when laying flat   [] History of DVT   [] Phlebitis   [x] Swelling in legs   [] Varicose veins   [] Non-healing ulcers Pulmonary:   [] Uses home oxygen   [] Productive cough   [] Hemoptysis   [] Wheeze  [] COPD   [] Asthma Neurologic:  [] Dizziness  [] Blackouts   [] Seizures   [] History of stroke   [] History of TIA  [] Aphasia   [] Temporary blindness   [] Dysphagia   [] Weakness or numbness in arms   [] Weakness or numbness in legs Musculoskeletal:  [x] Arthritis   [] Joint swelling   [x] Joint pain   [] Low back pain Hematologic:  [] Easy bruising  [] Easy bleeding   [] Hypercoagulable state   [x] Anemic   [] Hepatitis Gastrointestinal:  [] Blood in stool   [] Vomiting blood  [] Gastroesophageal reflux/heartburn   [] Abdominal pain Genitourinary:  [x] Chronic kidney disease   [] Difficult  urination  [] Frequent urination  [] Burning with urination   [] Hematuria Skin:  [] Rashes   [] Ulcers   [] Wounds Psychological:  [] History of anxiety   []  History of major depression.    Physical Exam BP (!) 177/81 (BP Location: Right Arm)   Pulse 91   Resp 16   Wt 181 lb 14.2 oz (82.5 kg)   BMI 36.12 kg/m  Gen:  WD/WN, NAD Head: /AT, No temporalis wasting. Ear/Nose/Throat: Hearing grossly intact, nares w/o erythema or drainage, oropharynx w/o Erythema/Exudate Eyes: Conjunctiva clear, sclera non-icteric  Neck: trachea midline.  No JVD.  Pulmonary:  Good air movement, respirations not labored, no use of accessory muscles  Cardiac: RRR, no JVD Vascular: PermCath exiting from the right chest Vessel Right Left  Radial Palpable Palpable                          DP  not palpable  not palpable  PT  not palpable  not palpable   Gastrointestinal:. No masses, not tender today.  She has a lower midline scar as well as multiple laparoscopic scars mostly on the left abdomen. Musculoskeletal: M/S 5/5 throughout.  Extremities without ischemic changes.  No deformity or atrophy.  3+ bilateral lower extremity edema. Neurologic: Sensation grossly intact in extremities.  Symmetrical.  Speech is fluent. Motor exam as listed above. Psychiatric: Judgment intact, Mood & affect appropriate for pt's clinical situation. Dermatologic: No rashes or ulcers noted.  No cellulitis or open wounds.    Radiology No results found.  Labs No results found for this or any previous visit (from the past 2160 hour(s)).  Assessment/Plan:  DM (diabetes mellitus) This in conjunction with hypertension and renal cancer or underlying cause of her renal failure and blood glucose control important in reducing the progression of  atherosclerotic disease. Also, involved in wound healing. On appropriate medications.   Essential hypertension blood pressure control important in reducing the progression of atherosclerotic disease. On appropriate oral medications.   MIXED HYPERLIPIDEMIA lipid control important in reducing the progression of atherosclerotic disease. Continue statin therapy   Chronic kidney disease (CKD), stage V ([place]) The patient has CKD stage V.  She was getting hemodialysis treatments but really cannot tolerate hemodialysis.  She is interested in proceeding with an attempted a peritoneal dialysis catheter.  She has had previous abdominal surgeries and apparently has significant intra-abdominal scar tissue.  That being said, if she does not get dialysis, she is eventually going to pass away and it does not sound like hemodialysis is an option.  I think it would be very reasonable to do an attempt at laparoscopic lysis of adhesion with placement of the peritoneal dialysis catheter.  I discussed that I cannot guarantee that this will work and the scar tissue may come back over time rendering the peritoneal dialysis catheter unusable.  She clearly understands the situation.  She understands that if she does not proceed with hemodialysis that she would likely pass away, and is more comfortable with that than most patients.  I have discussed that the surgery will be a little more complex and time-consuming than a person without previous abdominal surgery, but I am certainly willing to give this a chance in hopes that I can help her and get her a durable dialysis access.  She will be scheduled for peritoneal dialysis catheter placement and laparoscopic lysis of adhesion in the near future.      Leotis Pain 08/04/2019, 3:52 PM   This  note was created with Dragon medical transcription system.  Any errors from dictation are unintentional.

## 2019-08-04 NOTE — Patient Instructions (Signed)
Peritoneal Dialysis Catheter Placement  Peritoneal dialysis catheter placement is a surgery to insert a thin, flexible tube (catheter) into the abdomen. The catheter will be used for peritoneal dialysis, which is a process for filtering the blood. The catheter will be small, soft, and easy to conceal. The catheter placement is usually done at least 2 weeks before peritoneal dialysis is started. During dialysis, wastes, salt, and extra water are removed from the blood. In peritoneal dialysis, these tasks are performed by transferring a fluid (dialysate) to and from the abdomen during each session. The fluid goes through the catheter to enter the abdomen at the start of each dialysis session, and it drains out of the body through the catheter at the end of each session. This procedure will be done using one of the following techniques:  Open technique. This is when the surgery is performed through one incision.  Laparoscopic technique. This is when smaller incisions are made and a tube with a light and camera on the end (laparoscope) is inserted through one of the incisions to help perform the surgery. The camera sends images to a video screen in the operating room. This lets the surgeon see inside the abdomen during the procedure. Tell a health care provider about:  Any allergies you have.  All medicines you are taking, including vitamins, herbs, eye drops, creams, and over-the-counter medicines.  Any problems you or family members have had with anesthetic medicines.  Any blood disorders you have.  Any surgeries you have had.  Any history of smoking.  Any medical conditions you have.  Whether you are pregnant or may be pregnant. What are the risks? Generally, this is a safe procedure. However, problems may occur, including:  Infection.  Too much bleeding.  A collection of blood near the incision (hematoma).  Damage to blood vessels, tissues, or organs in the abdomen  area.  Allergic reactions to medicines.  Pain or cramping.  Slow healing.  Catheter problems after the surgery, such as: ? The catheter becoming blocked. ? The catheter moving out of place. ? The catheter poking into or wrapping around intestines. ? Fluid leaking around the catheter.  Scarring.  Skin damage. What happens before the procedure? Medicines Ask your health care provider about:  Changing or stopping your regular medicines. This is especially important if you take diabetes medicines or blood thinners.  Taking medicines such as aspirin and ibuprofen. These medicines can thin your blood. Do not take these medicines before your procedure if your health care provider tells you not to. Staying hydrated Follow instructions from your health care provider about hydration, which may include:  Up to 2 hours before the procedure - you may continue to drink clear liquids, such as water, clear fruit juice, black coffee, and plain tea. Eating and drinking restrictions Follow instructions from your health care provider about eating and drinking, which may include:  8 hours before the procedure - stop eating heavy meals or foods such as meat, fried foods, or fatty foods.  6 hours before the procedure - stop eating light meals or foods, such as toast or cereal.  6 hours before the procedure - stop drinking milk or drinks that contain milk.  2 hours before the procedure - stop drinking clear liquids. General instructions  Ask your health care provider how your catheter site will be marked or identified. Your health care provider will discuss the best site for the catheter to be placed. The site will be chosen to help prevent  the catheter from being flattened or damaged, and to make it as comfortable as possible for you.  You may be asked to shower with a germ-killing soap.  You may have a CT scan of your abdomen.  You may have a blood sample taken.  Plan to have someone take  you home from the hospital or clinic. What happens during the procedure?  To lower your risk of infection: ? Your health care team will wash or sanitize their hands. ? Your skin will be washed with soap. ? Hair may be removed from the surgical area.  An IV will be inserted into one of your veins.  You may be given antibiotic medicine through your IV.  You will be given a medicine to make you fall asleep (general anesthetic).  If you are having an open surgery, one larger incision will be made in the abdomen.  If you are having laparoscopic surgery, a laparoscope and instruments will be put through small incisions in the abdomen.  The catheter will be put in place.  A short tunnel will be made under the skin to a location where the catheter exits the abdomen.  Stitches (sutures) will be placed around the catheter to hold it in place.  Your incisions will be closed with sutures or staples. The procedure may vary among health care providers and hospitals. What happens after the procedure?  Your blood pressure, heart rate, breathing rate, and blood oxygen level will be monitored until the medicines you were given have worn off.  You may have some pain. You will be given pain medicine as needed.  You will be given instructions about how to care for your catheter and how it is used for the dialysis process. Summary  Peritoneal dialysis catheter placement is a surgery to insert a thin, flexible tube (catheter) in your abdomen. This surgery must be done before you begin peritoneal dialysis.  Before the procedure, your health care provider will discuss the best site for the catheter to be placed. The site will be chosen to help prevent the catheter from being flattened or damaged, and to make it as comfortable as possible for you.  After the procedure, you will be given instructions about how to care for your catheter and how it is used for the dialysis process. This information is not  intended to replace advice given to you by your health care provider. Make sure you discuss any questions you have with your health care provider. Document Revised: 08/14/2018 Document Reviewed: 06/01/2016 Elsevier Patient Education  [place].

## 2019-08-04 NOTE — Assessment & Plan Note (Signed)
blood pressure control important in reducing the progression of atherosclerotic disease. On appropriate oral medications.  

## 2019-08-04 NOTE — Assessment & Plan Note (Signed)
lipid control important in reducing the progression of atherosclerotic disease. Continue statin therapy  

## 2019-08-04 NOTE — Assessment & Plan Note (Signed)
This in conjunction with hypertension and renal cancer or underlying cause of her renal failure and blood glucose control important in reducing the progression of atherosclerotic disease. Also, involved in wound healing. On appropriate medications.

## 2019-08-04 NOTE — Assessment & Plan Note (Signed)
The patient has CKD stage V.  She was getting hemodialysis treatments but really cannot tolerate hemodialysis.  She is interested in proceeding with an attempted a peritoneal dialysis catheter.  She has had previous abdominal surgeries and apparently has significant intra-abdominal scar tissue.  That being said, if she does not get dialysis, she is eventually going to pass away and it does not sound like hemodialysis is an option.  I think it would be very reasonable to do an attempt at laparoscopic lysis of adhesion with placement of the peritoneal dialysis catheter.  I discussed that I cannot guarantee that this will work and the scar tissue may come back over time rendering the peritoneal dialysis catheter unusable.  She clearly understands the situation.  She understands that if she does not proceed with hemodialysis that she would likely pass away, and is more comfortable with that than most patients.  I have discussed that the surgery will be a little more complex and time-consuming than a person without previous abdominal surgery, but I am certainly willing to give this a chance in hopes that I can help her and get her a durable dialysis access.  She will be scheduled for peritoneal dialysis catheter placement and laparoscopic lysis of adhesion in the near future.

## 2019-08-11 ENCOUNTER — Telehealth (INDEPENDENT_AMBULATORY_CARE_PROVIDER_SITE_OTHER): Payer: Self-pay

## 2019-08-11 NOTE — Telephone Encounter (Signed)
I have attempted multiple times to contact the patient to schedule her for a PD cath insertion to no avail. The phone just continuously rings without a voicemail box.

## 2019-08-12 ENCOUNTER — Ambulatory Visit: Payer: Medicare Other | Attending: Internal Medicine

## 2019-08-12 DIAGNOSIS — Z23 Encounter for immunization: Secondary | ICD-10-CM

## 2019-08-12 NOTE — Progress Notes (Signed)
   Covid-19 Vaccination Clinic  Name:  SUMAN TRIVEDI    MRN: [id number] DOB: December 06, 1950  08/12/2019  Ms. Zern was observed post Covid-19 immunization for 15 minutes without incident. She was provided with Vaccine Information Sheet and instruction to access the V-Safe system.   Ms. Rosetti was instructed to call 911 with any severe reactions post vaccine: Marland Kitchen Difficulty breathing  . Swelling of face and throat  . A fast heartbeat  . A bad rash all over body  . Dizziness and weakness   Immunizations Administered    Name Date Dose VIS Date Route   Pfizer COVID-19 Vaccine 08/12/2019  1:16 PM 0.3 mL 04/17/2019 Intramuscular   Manufacturer: [place]   Lot: [id number]   [place]: [id number]

## 2019-08-17 ENCOUNTER — Telehealth (INDEPENDENT_AMBULATORY_CARE_PROVIDER_SITE_OTHER): Payer: Self-pay

## 2019-08-17 NOTE — Telephone Encounter (Signed)
Spoke with the patient and she is now scheduled with Dr. Lucky Cowboy for a PD cath insertion on 08/27/19. Patient will do a phone call pre-op on 08/21/19 between 8-1 pm and covid testing on 08/25/19 between 8-1 pm at the [facility]. Pre-surgical instructions were discussed and will be mailed to the patient.

## 2019-08-20 ENCOUNTER — Other Ambulatory Visit (INDEPENDENT_AMBULATORY_CARE_PROVIDER_SITE_OTHER): Payer: Self-pay | Admitting: Nurse Practitioner

## 2019-08-21 ENCOUNTER — Encounter
Admission: RE | Admit: 2019-08-21 | Discharge: 2019-08-21 | Disposition: A | Discharge status: Home / Self Care | Payer: Medicare Other | Source: Ambulatory Visit | Attending: Vascular Surgery | Admitting: Vascular Surgery

## 2019-08-21 ENCOUNTER — Telehealth (INDEPENDENT_AMBULATORY_CARE_PROVIDER_SITE_OTHER): Payer: Self-pay

## 2019-08-21 ENCOUNTER — Other Ambulatory Visit (INDEPENDENT_AMBULATORY_CARE_PROVIDER_SITE_OTHER): Payer: Self-pay | Admitting: Nurse Practitioner

## 2019-08-21 ENCOUNTER — Telehealth: Payer: Self-pay | Admitting: Family Medicine

## 2019-08-21 ENCOUNTER — Other Ambulatory Visit: Payer: Self-pay

## 2019-08-21 HISTORY — DX: Sleep apnea, unspecified: G47.30

## 2019-08-21 NOTE — Telephone Encounter (Signed)
Patient left a message stating she was having trouble with transportation. I returned the call and per the patient she is over her limit for the transportation unit she uses. I gave her the number [phone] that is associated with [place] transportation and suggested she call and find out if they could help. Patient stated she would call.

## 2019-08-21 NOTE — Pre-Procedure Instructions (Signed)
EKG 12-Lead Order: [id number] Status:  Final result Visible to patient:  No (inaccessible in MyChart) Next appt:  08/24/2019 at 09:00 AM in Radiology ([facility]) Dx:  Essential hypertension      Last Resulted: 06/12/19 08:56      Order Details    View Encounter    Lab and Collection Details    Routing    Result History       Scans on Order [id number]  Scan on 06/12/2019 8:59 AM by Delfino Lovett T: EKG    Result Communications   Result Notes   Daune Perch, NP  06/12/2019 1:17 PM EST    No change from prior. She is cleared to have surgery. See office note.   Daune Perch, NP

## 2019-08-21 NOTE — Telephone Encounter (Signed)
°   KRISTI NORMENT DOB: 1950-06-18 MRN: [id number]   RIDER WAIVER AND RELEASE OF LIABILITY  For purposes of improving physical access to our facilities, [facility] is pleased to partner with third parties to provide [facility] patients or other authorized individuals the option of convenient, on-demand ground transportation services (the [employer]) through use of the technology service that enables users to request on-demand ground transportation from independent third-party providers.  By opting to use and accept these [employer], I, the undersigned, hereby agree on behalf of myself, and on behalf of any minor child using the [employer] for whom I am the parent or legal guardian, as follows:  1. Government social research officer provided to me are provided by independent third-party transportation providers who are not [employer] or employees and who are unaffiliated with [employer]. 2. [facility] is neither a transportation carrier nor a common or public carrier. 3. [place] has no control over the quality or safety of the transportation that occurs as a result of the [employer]. 4. [facility] cannot guarantee that any third-party transportation provider will complete any arranged transportation service. 5. [facility] makes no representation, warranty, or guarantee regarding the reliability, timeliness, quality, safety, suitability, or availability of any of the Transport Services or that they will be error free. 6. I fully understand that traveling by vehicle involves risks and dangers of serious bodily injury, including permanent disability, paralysis, and death. I agree, on behalf of myself and on behalf of any minor child using the Transport Services for whom I am the parent or legal guardian, that the entire risk arising out of my use of the [employer] remains solely with me, to the maximum extent permitted under applicable law. 7. The [employer] are provided as is and as available. [place] [place] [facility] disclaims all representations and warranties, express, implied or statutory, not expressly set out in these terms, including the implied warranties of merchantability and fitness for a particular purpose. 8. I hereby waive and release [facility] [facility], its agents, employees, officers, directors, representatives, insurers, attorneys, assigns, successors, subsidiaries, and affiliates from any and all past, present, or future claims, demands, liabilities, actions, causes of action, or suits of any kind directly or indirectly arising from acceptance and use of the [employer]. 9. I further waive and release [facility] and its affiliates from all present and future liability and responsibility for any injury or death to persons or damages to property caused by or related to the use of the [employer]. 10. I have read this Waiver and Release of Liability, and I understand the terms used in it and their legal significance. This Waiver is freely and voluntarily given with the understanding that my right (as well as the right of any minor child for whom I am the parent or legal guardian using the [employer]) to legal recourse against [facility] in connection with the [employer] is knowingly surrendered in return for use of these services.   I attest that I read the consent document to Renard Matter, gave Ms. Chisom the opportunity to ask questions and answered the questions asked (if any). I affirm that Renard Matter then provided consent for she's participation in this program.     Legrand Pitts

## 2019-08-21 NOTE — Patient Instructions (Addendum)
Your procedure is scheduled on: 08-27-19 THURSDAY Report to Same Day Surgery 2nd floor medical mall [facility] Entrance-take elevator on left to 2nd floor.  Check in with surgery information desk.) To find out your arrival time please call [phone] between 1PM - 3PM on 08-26-19 [facility]  Remember: Instructions that are not followed completely may result in serious medical risk, up to and including death, or upon the discretion of your surgeon and anesthesiologist your surgery may need to be rescheduled.    _x___ 1. Do not eat food after midnight the night before your procedure. NO GUM OR CANDY AFTER MIDNIGHT. You may drink WATER up to 2 hours before you are scheduled to arrive at the hospital for your procedure.  Do not drink WATER within 2 hours of your scheduled arrival to the hospital.  Type 1 and type 2 diabetics should only drink water.   ____Ensure clear carbohydrate drink on the way to the hospital for bariatric patients  ____Ensure clear carbohydrate drink 3 hours before surgery.    __x__ 2. No Alcohol for 24 hours before or after surgery.   __x__3. No Smoking or e-cigarettes for 24 prior to surgery.  Do not use any chewable tobacco products for at least 6 hour prior to surgery   ____  4. Bring all medications with you on the day of surgery if instructed.    __x__ 5. Notify your doctor if there is any change in your medical condition     (cold, fever, infections).    x___6. On the morning of surgery brush your teeth with toothpaste and water.  You may rinse your mouth with mouth wash if you wish.  Do not swallow any toothpaste or mouthwash.   Do not wear jewelry, make-up, hairpins, clips or nail polish.  Do not wear lotions, powders, or perfumes.   Do not shave 48 hours prior to surgery. Men may shave face and neck.  Do not bring valuables to the hospital.    [facility] is not responsible for any belongings or valuables.               Contacts, dentures or  bridgework may not be worn into surgery.  Leave your suitcase in the car. After surgery it may be brought to your room.  For patients admitted to the hospital, discharge time is determined by your treatment team.  _  Patients discharged the day of surgery will not be allowed to drive home.  You will need someone to drive you home and stay with you the night of your procedure.    Please read over the following fact sheets that you were given:   [facility] Preparing for Surgery   _x___ TAKE THE FOLLOWING MEDICATION THE MORNING OF SURGERY WITH A SMALL SIP OF WATER. These include:  1. TOPROL (METOPROLOL)  2. IMDUR (ISOSORBIDE)  3.  4.  5.  6.  ____Fleets enema or Magnesium Citrate as directed.   _x___ Use CHG Soap or sage wipes as directed on instruction sheet   ____ Use inhalers on the day of surgery and bring to hospital day of surgery  ____ Stop Metformin and Janumet 2 days prior to surgery.    _X___ Take 1/2 of usual insulin dose the night before surgery and none on the morning surgery-TAKE HALF OF YOUR LANTUS (15 UNITS) WEDNESDAY NIGHT BEFORE BED   _x___ Follow recommendations from Cardiologist, Pulmonologist or PCP regarding stopping Aspirin, Coumadin, Plavix ,Eliquis, Effient, or Pradaxa, and Pletal-CONTINUE YOUR  81 MG ASPIRIN. DO NOT TAKE AM OF SURGERY  X____Stop Anti-inflammatories such as Advil, Aleve, Ibuprofen, Motrin, Naproxen, Naprosyn, Goodies powders or aspirin products NOW- OK to take Tylenol    _x___ Stop supplements until after surgery-STOP CO Q-10 AND SPIRULINA NOW-MAY RESUME AFTER SURGERY   _X___ Bring C-Pap to the hospital.

## 2019-08-21 NOTE — Pre-Procedure Instructions (Signed)
Amanda Perch, NP  Nurse Practitioner  Cardiology  Progress Notes   Signed  Encounter Date: 06/12/2019          Signed     Expand AllCollapse All     Show:Clear all  ManualTemplateCopied Added by:  Amanda Perch, NP Hover for details                                                                                                                                         Virtual Visit via Telephone Note   This visit type was conducted due to national recommendations for restrictions regarding the COVID-19 Pandemic (e.g. social distancing) in an effort to limit this patient's exposure and mitigate transmission in our community. Due to her co-morbid illnesses, this patient is at least at moderate risk for complications without adequate follow up. This format is felt to be most appropriate for this patient at this time. The patient did not have access to video technology/had technical difficulties with video requiring transitioning to audio format only (telephone). All issues noted in this document were discussed and addressed. No physical exam could be performed with this format. Please refer to the patient's chart for her consent to telehealth for [facility].  Date: 06/12/2019  ID: [name], DOB 31-Jan-1951, MRN [id number]  Patient Location: Other: Joneen Roach of our [place] office  Provider Location: Office  PCP: Lucia Gaskins, MD  Cardiologist: Jenkins Rouge, MD  Electrophysiologist: None  Evaluation Performed: Follow-Up Visit  Chief Complaint: Preop clearance    History of Present Illness:    [name] is a 69 y.o. female with a past medical history significant for CAD, MI in 2000 and 2007, hyperlipidemia, hypertension, diabetes type 2, CKD, kidney cancer, anemia of chronic disease and arthritis.  She has a history of chronic total occlusion  of the RCA with collaterals, no critical left-sided disease by left heart cath in 2008. She was hospitalized for pancreatitis in 2013. CT showed left renal mass 3.7 cm. She had a subsequent left nephrectomy.  Her last Myoview in 12/2016 was low risk with EF 50%.  She was last seen in the office by Dr. [name] on 11/18/2018 at which time she was doing well from a cardiac standpoint. She was noted to have worsening renal function and was referred to nephrology. She did not want to be on dialysis and noted that she had lived twice as long as her mother already. She had some lower extremity edema, on Lasix and advised could take an extra dose in the afternoon if needed.  The patient is being seen today for preop clearance for laparoscopic PD catheter, omentopexy, possible open procedure scheduled for 06/23/2019. She was to be seen in our [employer] by me, however she arrived at the [place] office. I am having an EKG done in [place] and I will do a virtual visit for for clearance. Reportedly  she is having no acute issues.  Today Ms Vanorman reports that she has mild DOE with more exertion like shopping and carrying groceries, doing laundry, things that she does not do every day. She is fine with her daily activities. This has been stable over time. She denies chest pain/pressure, resting shortness of breath, orthopnea, PND, palpitations, lightheadedness or syncope. She says that her edema has greatly improved since starting treatment for sleep apnea and starting dialysis. She used to have to wear slippers due to not being able to wear her shoes. She is now back into her shoes.  BP at dialysis 120-150.  She says that her weight was up to 205 pounds at the time of her evaluation for dialysis and is now down to 168.8 pounds.  The patient does not have symptoms concerning for COVID-19 infection (fever, chills, cough, or new shortness of breath).         Past Medical History:   Diagnosis Date   .  Arthritis    . CAD (coronary artery disease)     Cath 2008 total RCA with collaterals   . Cancer ([place])     kidney   . Chronic kidney disease 08/2011    kidney cancer   . Diabetes mellitus    . Glaucoma    . HTN (hypertension), benign    . Hyperlipidemia    . Myocardial infarction ([place]) 2000, 2007   . Neuromuscular disorder ([place])     neck & bilateral hands   . Pneumonia 1990   . Seizures ([place]) 40 years ago    pre eclampsia   . Shortness of breath             Past Surgical History:   Procedure Laterality Date   . ABDOMINAL HYSTERECTOMY     . IR FLUORO GUIDE CV LINE RIGHT  04/06/2019   . IR US GUIDE VASC ACCESS RIGHT  04/06/2019   . nacrotizing faschitis     . NECK SURGERY     . ROTATOR CUFF REPAIR      right arm       Active Medications                                                                                                                         Electronically signed by Amanda Perch, NP at 06/12/2019 10:12 AM       Telemedicine on 06/12/2019  Detailed Report  Note shared with patient

## 2019-08-21 NOTE — Pre-Procedure Instructions (Signed)
Cardiac clearance in Epic

## 2019-08-24 ENCOUNTER — Other Ambulatory Visit: Payer: Self-pay

## 2019-08-24 ENCOUNTER — Ambulatory Visit (HOSPITAL_COMMUNITY)
Admission: RE | Admit: 2019-08-24 | Discharge: 2019-08-24 | Disposition: A | Discharge status: Home / Self Care | Payer: Medicare Other | Source: Ambulatory Visit | Attending: Nephrology | Admitting: Nephrology

## 2019-08-24 DIAGNOSIS — N186 End stage renal disease: Secondary | ICD-10-CM | POA: Diagnosis not present

## 2019-08-24 DIAGNOSIS — Z4901 Encounter for fitting and adjustment of extracorporeal dialysis catheter: Secondary | ICD-10-CM | POA: Insufficient documentation

## 2019-08-24 HISTORY — PX: IR REMOVAL TUN CV CATH W/O FL: IMG2289

## 2019-08-24 MED ORDER — LIDOCAINE HCL 1 % IJ SOLN
INTRAMUSCULAR | Status: AC
  Filled 2019-08-24: qty 20

## 2019-08-24 MED ORDER — CHLORHEXIDINE GLUCONATE 4 % EX LIQD
CUTANEOUS | Status: AC
  Filled 2019-08-24: qty 15

## 2019-08-24 MED ORDER — LIDOCAINE HCL 1 % IJ SOLN
INTRAMUSCULAR | Status: DC | PRN
  Administered 2019-08-24: 5 mL

## 2019-08-24 NOTE — Procedures (Signed)
Pre procedural Dx: ESRD Post procedural Dx: Same  Patient planned for PD catheter placement 4/22 - discussed that should PD catheter be unable to be placed as planned she will not have any dialysis access once tunneled HD catheter is removed, offered to reschedule removal until post PD placement which patient declined. Patient stated understanding of the above and requests to have HD catheter removed today,  Successful removal of tunneled HD catheter  EBL: None No immediate complications.  Please see imaging section of Epic for full dictation.  Joaquim Nam PA-C 08/24/2019 9:12 AM

## 2019-08-25 ENCOUNTER — Other Ambulatory Visit
Admission: RE | Admit: 2019-08-25 | Discharge: 2019-08-25 | Disposition: A | Discharge status: Home / Self Care | Payer: Medicare Other | Source: Ambulatory Visit | Attending: Vascular Surgery | Admitting: Vascular Surgery

## 2019-08-25 DIAGNOSIS — Z20822 Contact with and (suspected) exposure to covid-19: Secondary | ICD-10-CM | POA: Insufficient documentation

## 2019-08-25 DIAGNOSIS — Z01812 Encounter for preprocedural laboratory examination: Secondary | ICD-10-CM | POA: Diagnosis present

## 2019-08-25 DIAGNOSIS — N189 Chronic kidney disease, unspecified: Secondary | ICD-10-CM | POA: Insufficient documentation

## 2019-08-25 LAB — APTT: aPTT: 74 seconds — ABNORMAL HIGH (ref 24–36)

## 2019-08-25 LAB — TYPE AND SCREEN
ABO/RH(D): O POS
Antibody Screen: NEGATIVE

## 2019-08-25 LAB — BASIC METABOLIC PANEL
Anion gap: 10 (ref 5–15)
BUN: 61 mg/dL — ABNORMAL HIGH (ref 8–23)
CO2: 20 mmol/L — ABNORMAL LOW (ref 22–32)
Calcium: 8.7 mg/dL — ABNORMAL LOW (ref 8.9–10.3)
Chloride: 113 mmol/L — ABNORMAL HIGH (ref 98–111)
Creatinine, Ser: 9.13 mg/dL — ABNORMAL HIGH (ref 0.44–1.00)
GFR calc Af Amer: 5 mL/min — ABNORMAL LOW (ref >60)
GFR calc non Af Amer: 4 mL/min — ABNORMAL LOW (ref >60)
Glucose, Bld: 92 mg/dL (ref 70–99)
Potassium: 4.5 mmol/L (ref 3.5–5.1)
Sodium: 143 mmol/L (ref 135–145)

## 2019-08-25 LAB — CBC WITH DIFFERENTIAL/PLATELET
Abs Immature Granulocytes: 0.02 10*3/uL (ref 0.00–0.07)
Basophils Absolute: 0 10*3/uL (ref 0.0–0.1)
Basophils Relative: 0 %
Eosinophils Absolute: 0.1 10*3/uL (ref 0.0–0.5)
Eosinophils Relative: 2 %
HCT: 31.9 % — ABNORMAL LOW (ref 36.0–46.0)
Hemoglobin: 10 g/dL — ABNORMAL LOW (ref 12.0–15.0)
Immature Granulocytes: 0 %
Lymphocytes Relative: 34 %
Lymphs Abs: 2.4 10*3/uL (ref 0.7–4.0)
MCH: 28.7 pg (ref 26.0–34.0)
MCHC: 31.3 g/dL (ref 30.0–36.0)
MCV: 91.7 fL (ref 80.0–100.0)
Monocytes Absolute: 0.5 10*3/uL (ref 0.1–1.0)
Monocytes Relative: 7 %
Neutro Abs: 4 10*3/uL (ref 1.7–7.7)
Neutrophils Relative %: 57 %
Platelets: 183 10*3/uL (ref 150–400)
RBC: 3.48 MIL/uL — ABNORMAL LOW (ref 3.87–5.11)
RDW: 14.8 % (ref 11.5–15.5)
WBC: 7.1 10*3/uL (ref 4.0–10.5)
nRBC: 0 % (ref 0.0–0.2)

## 2019-08-25 LAB — PROTIME-INR
INR: 1 (ref 0.8–1.2)
Prothrombin Time: 13.1 seconds (ref 11.4–15.2)

## 2019-08-25 LAB — SARS CORONAVIRUS 2 (TAT 6-24 HRS): SARS Coronavirus 2: NEGATIVE

## 2019-08-27 ENCOUNTER — Other Ambulatory Visit: Payer: Self-pay

## 2019-08-27 ENCOUNTER — Ambulatory Visit
Admission: RE | Admit: 2019-08-27 | Discharge: 2019-08-27 | Disposition: A | Discharge status: Home / Self Care | Payer: Medicare Other | Attending: Vascular Surgery | Admitting: Vascular Surgery

## 2019-08-27 ENCOUNTER — Ambulatory Visit: Payer: Medicare Other | Admitting: Certified Registered Nurse Anesthetist

## 2019-08-27 ENCOUNTER — Encounter: Payer: Self-pay | Admitting: Vascular Surgery

## 2019-08-27 ENCOUNTER — Encounter
Admission: RE | Disposition: A | Discharge status: Home / Self Care | Payer: Self-pay | Source: Home / Self Care | Attending: Vascular Surgery

## 2019-08-27 DIAGNOSIS — Z888 Allergy status to other drugs, medicaments and biological substances status: Secondary | ICD-10-CM | POA: Insufficient documentation

## 2019-08-27 DIAGNOSIS — M1990 Unspecified osteoarthritis, unspecified site: Secondary | ICD-10-CM | POA: Insufficient documentation

## 2019-08-27 DIAGNOSIS — Z8249 Family history of ischemic heart disease and other diseases of the circulatory system: Secondary | ICD-10-CM | POA: Diagnosis not present

## 2019-08-27 DIAGNOSIS — I120 Hypertensive chronic kidney disease with stage 5 chronic kidney disease or end stage renal disease: Secondary | ICD-10-CM | POA: Insufficient documentation

## 2019-08-27 DIAGNOSIS — R569 Unspecified convulsions: Secondary | ICD-10-CM | POA: Diagnosis not present

## 2019-08-27 DIAGNOSIS — E1136 Type 2 diabetes mellitus with diabetic cataract: Secondary | ICD-10-CM | POA: Insufficient documentation

## 2019-08-27 DIAGNOSIS — I252 Old myocardial infarction: Secondary | ICD-10-CM | POA: Insufficient documentation

## 2019-08-27 DIAGNOSIS — Z794 Long term (current) use of insulin: Secondary | ICD-10-CM | POA: Diagnosis not present

## 2019-08-27 DIAGNOSIS — E1122 Type 2 diabetes mellitus with diabetic chronic kidney disease: Secondary | ICD-10-CM | POA: Diagnosis not present

## 2019-08-27 DIAGNOSIS — I2510 Atherosclerotic heart disease of native coronary artery without angina pectoris: Secondary | ICD-10-CM | POA: Insufficient documentation

## 2019-08-27 DIAGNOSIS — E782 Mixed hyperlipidemia: Secondary | ICD-10-CM | POA: Insufficient documentation

## 2019-08-27 DIAGNOSIS — Z7982 Long term (current) use of aspirin: Secondary | ICD-10-CM | POA: Insufficient documentation

## 2019-08-27 DIAGNOSIS — Z992 Dependence on renal dialysis: Secondary | ICD-10-CM | POA: Diagnosis not present

## 2019-08-27 DIAGNOSIS — N185 Chronic kidney disease, stage 5: Secondary | ICD-10-CM | POA: Diagnosis not present

## 2019-08-27 DIAGNOSIS — Z79899 Other long term (current) drug therapy: Secondary | ICD-10-CM | POA: Insufficient documentation

## 2019-08-27 DIAGNOSIS — Z87891 Personal history of nicotine dependence: Secondary | ICD-10-CM | POA: Insufficient documentation

## 2019-08-27 DIAGNOSIS — K660 Peritoneal adhesions (postprocedural) (postinfection): Secondary | ICD-10-CM | POA: Insufficient documentation

## 2019-08-27 HISTORY — PX: INSERTION OF DIALYSIS CATHETER: SHX1324

## 2019-08-27 HISTORY — PX: LAPAROSCOPIC LYSIS OF ADHESIONS: SHX5905

## 2019-08-27 LAB — GLUCOSE, CAPILLARY
Glucose-Capillary: 60 mg/dL — ABNORMAL LOW (ref 70–99)
Glucose-Capillary: 95 mg/dL (ref 70–99)

## 2019-08-27 SURGERY — INSERTION OF DIALYSIS CATHETER
Anesthesia: General | Site: Abdomen

## 2019-08-27 MED ORDER — FENTANYL CITRATE (PF) 100 MCG/2ML IJ SOLN
INTRAMUSCULAR | Status: AC
  Filled 2019-08-27: qty 2

## 2019-08-27 MED ORDER — PROPOFOL 10 MG/ML IV BOLUS
INTRAVENOUS | Status: DC | PRN
  Administered 2019-08-27: 100 mg via INTRAVENOUS

## 2019-08-27 MED ORDER — BACITRACIN ZINC 500 UNIT/GM EX OINT
TOPICAL_OINTMENT | CUTANEOUS | Status: AC
  Filled 2019-08-27: qty 28.35

## 2019-08-27 MED ORDER — REMIFENTANIL HCL 1 MG IV SOLR
INTRAVENOUS | Status: AC
  Filled 2019-08-27: qty 1000

## 2019-08-27 MED ORDER — ACETAMINOPHEN 325 MG PO TABS: 325.0000 mg | ORAL_TABLET | ORAL | Status: DC | PRN

## 2019-08-27 MED ORDER — LIDOCAINE HCL (CARDIAC) PF 100 MG/5ML IV SOSY
PREFILLED_SYRINGE | INTRAVENOUS | Status: DC | PRN
  Administered 2019-08-27: 100 mg via INTRAVENOUS

## 2019-08-27 MED ORDER — CEFAZOLIN SODIUM-DEXTROSE 1-4 GM/50ML-% IV SOLN
INTRAVENOUS | Status: AC
  Filled 2019-08-27: qty 50

## 2019-08-27 MED ORDER — HYDROCODONE-ACETAMINOPHEN 7.5-325 MG PO TABS
1.0000 | ORAL_TABLET | Freq: Once | ORAL | Status: DC | PRN
  Filled 2019-08-27: qty 1

## 2019-08-27 MED ORDER — ONDANSETRON HCL 4 MG/2ML IJ SOLN
INTRAMUSCULAR | Status: DC | PRN
  Administered 2019-08-27: 4 mg via INTRAVENOUS

## 2019-08-27 MED ORDER — PHENYLEPHRINE HCL (PRESSORS) 10 MG/ML IV SOLN
INTRAVENOUS | Status: DC | PRN
  Administered 2019-08-27 (×2): 100 ug via INTRAVENOUS

## 2019-08-27 MED ORDER — FENTANYL CITRATE (PF) 100 MCG/2ML IJ SOLN
INTRAMUSCULAR | Status: AC
  Administered 2019-08-27: 25 ug via INTRAVENOUS
  Filled 2019-08-27: qty 2

## 2019-08-27 MED ORDER — SODIUM CHLORIDE 0.9 % IV SOLN: INTRAVENOUS | Status: DC

## 2019-08-27 MED ORDER — PROMETHAZINE HCL 25 MG/ML IJ SOLN: 6.2500 mg | INTRAMUSCULAR | Status: DC | PRN

## 2019-08-27 MED ORDER — MIDAZOLAM HCL 2 MG/2ML IJ SOLN
INTRAMUSCULAR | Status: DC | PRN
  Administered 2019-08-27: 2 mg via INTRAVENOUS

## 2019-08-27 MED ORDER — SODIUM CHLORIDE 0.9 % IV SOLN
INTRAVENOUS | Status: DC | PRN
  Administered 2019-08-27: 50 ug/min via INTRAVENOUS

## 2019-08-27 MED ORDER — CHLORHEXIDINE GLUCONATE CLOTH 2 % EX PADS: 6.0000 | MEDICATED_PAD | Freq: Once | CUTANEOUS | Status: DC

## 2019-08-27 MED ORDER — SUGAMMADEX SODIUM 200 MG/2ML IV SOLN
INTRAVENOUS | Status: DC | PRN
  Administered 2019-08-27: 400 mg via INTRAVENOUS

## 2019-08-27 MED ORDER — CEFAZOLIN SODIUM-DEXTROSE 1-4 GM/50ML-% IV SOLN
1.0000 g | INTRAVENOUS | Status: AC
  Administered 2019-08-27: 1 g via INTRAVENOUS

## 2019-08-27 MED ORDER — ACETAMINOPHEN 160 MG/5ML PO SOLN
325.0000 mg | ORAL | Status: DC | PRN
  Filled 2019-08-27: qty 20.3

## 2019-08-27 MED ORDER — PROPOFOL 10 MG/ML IV BOLUS
INTRAVENOUS | Status: AC
  Filled 2019-08-27: qty 20

## 2019-08-27 MED ORDER — FENTANYL CITRATE (PF) 100 MCG/2ML IJ SOLN
INTRAMUSCULAR | Status: DC | PRN
  Administered 2019-08-27: 100 ug via INTRAVENOUS

## 2019-08-27 MED ORDER — FAMOTIDINE 20 MG PO TABS: 20.0000 mg | ORAL_TABLET | Freq: Once | ORAL | Status: DC

## 2019-08-27 MED ORDER — FENTANYL CITRATE (PF) 100 MCG/2ML IJ SOLN
25.0000 ug | INTRAMUSCULAR | Status: DC | PRN
  Administered 2019-08-27 (×2): 25 ug via INTRAVENOUS

## 2019-08-27 MED ORDER — ROCURONIUM BROMIDE 100 MG/10ML IV SOLN
INTRAVENOUS | Status: DC | PRN
  Administered 2019-08-27: 50 mg via INTRAVENOUS

## 2019-08-27 MED ORDER — MIDAZOLAM HCL 2 MG/2ML IJ SOLN
INTRAMUSCULAR | Status: AC
  Filled 2019-08-27: qty 2

## 2019-08-27 MED ORDER — HYDROCODONE-ACETAMINOPHEN 5-325 MG PO TABS: 1.0000 | ORAL_TABLET | Freq: Four times a day (QID) | ORAL | 0 refills | Status: DC | PRN

## 2019-08-27 SURGICAL SUPPLY — 39 items
ADAPTER BETA CAP QUINTON DIALY (ADAPTER) IMPLANT
ADAPTER CATH DIALYSIS 18.75 (CATHETERS) ×3 IMPLANT
ADAPTER CATH DIALYSIS 18.75CM (CATHETERS) ×1
ADH SKN CLS APL DERMABOND .7 (GAUZE/BANDAGES/DRESSINGS) ×2
ADPR DLYS BCP STRL PRTNL ULTEM (ADAPTER)
APL PRP STRL LF DISP 70% ISPRP (MISCELLANEOUS) ×2
CANISTER SUCT 1200ML W/VALVE (MISCELLANEOUS) ×4 IMPLANT
CATH DLYS SWAN NECK 62.5CM (CATHETERS) ×4 IMPLANT
CHLORAPREP W/TINT 26 (MISCELLANEOUS) ×4 IMPLANT
COVER WAND RF STERILE (DRAPES) ×4 IMPLANT
DERMABOND ADVANCED (GAUZE/BANDAGES/DRESSINGS) ×2
DERMABOND ADVANCED .7 DNX12 (GAUZE/BANDAGES/DRESSINGS) ×2 IMPLANT
ELECT CAUTERY BLADE 6.4 (BLADE) ×4 IMPLANT
ELECT REM PT RETURN 9FT ADLT (ELECTROSURGICAL) ×4
ELECTRODE REM PT RTRN 9FT ADLT (ELECTROSURGICAL) ×2 IMPLANT
GLOVE BIO SURGEON STRL SZ7 (GLOVE) ×12 IMPLANT
GLOVE INDICATOR 7.5 STRL GRN (GLOVE) ×4 IMPLANT
GOWN STRL REUS W/ TWL LRG LVL3 (GOWN DISPOSABLE) ×4 IMPLANT
GOWN STRL REUS W/ TWL XL LVL3 (GOWN DISPOSABLE) ×2 IMPLANT
GOWN STRL REUS W/TWL LRG LVL3 (GOWN DISPOSABLE) ×12
GOWN STRL REUS W/TWL XL LVL3 (GOWN DISPOSABLE) ×4
IV NS 500ML (IV SOLUTION) ×4
IV NS 500ML BAXH (IV SOLUTION) ×2 IMPLANT
KIT TURNOVER KIT A (KITS) ×4 IMPLANT
LABEL OR SOLS (LABEL) ×4 IMPLANT
MINICAP W/POVIDONE IODINE SOL (MISCELLANEOUS) ×4 IMPLANT
PACK LAP CHOLECYSTECTOMY (MISCELLANEOUS) ×4 IMPLANT
PENCIL ELECTRO HAND CTR (MISCELLANEOUS) ×4 IMPLANT
SET CYSTO W/LG BORE CLAMP LF (SET/KITS/TRAYS/PACK) ×4 IMPLANT
SET TRANSFER 6 W/TWIST CLAMP 5 (SET/KITS/TRAYS/PACK) ×4 IMPLANT
SET TUBE SMOKE EVAC HIGH FLOW (TUBING) ×4 IMPLANT
SLEEVE ENDOPATH XCEL 5M (ENDOMECHANICALS) ×2 IMPLANT
SPONGE DRAIN TRACH 4X4 STRL 2S (GAUZE/BANDAGES/DRESSINGS) ×4 IMPLANT
SUT MNCRL AB 4-0 PS2 18 (SUTURE) ×4 IMPLANT
SUT VIC AB 2-0 UR6 27 (SUTURE) ×4 IMPLANT
SUT VICRYL+ 3-0 36IN CT-1 (SUTURE) ×4 IMPLANT
TROCAR XCEL NON-BLD 11X100MML (ENDOMECHANICALS) ×4 IMPLANT
TROCAR XCEL NON-BLD 5MMX100MML (ENDOMECHANICALS) ×2 IMPLANT
TROCAR XCEL UNIV SLVE 11M 100M (ENDOMECHANICALS) ×2 IMPLANT

## 2019-08-27 NOTE — H&P (Signed)
[facility] History & Physical Update  The patient was interviewed and re-examined.  The patient's previous History and Physical has been reviewed and is unchanged.  There is no change in the plan of care. We plan to proceed with the scheduled procedure.  Leotis Pain, MD  08/27/2019, 10:56 AM

## 2019-08-27 NOTE — Anesthesia Preprocedure Evaluation (Addendum)
Anesthesia Evaluation   Patient awake    Reviewed: Allergy & Precautions, H&P , NPO status , reviewed documented beta blocker date and time   Airway Mallampati: III  TM Distance: >3 FB Neck ROM: limited    Dental  (+) Partial Upper, Partial Lower, Caps, Missing   Pulmonary shortness of breath, sleep apnea and Continuous Positive Airway Pressure Ventilation , pneumonia, resolved, former smoker,    Pulmonary exam normal        Cardiovascular hypertension, + CAD, + Past MI and + Cardiac Stents  Normal cardiovascular exam  Obtained cardiac clearance 06/2019 for surgery. Last Myoview 2018 50% EF w low risk scan. Cleared for surgery with no further testing indicated.   Neuro/Psych Seizures -,   Neuromuscular disease    GI/Hepatic neg GERD  ,  Endo/Other  diabetes  Renal/GU Dialysis and CRFRenal diseaseUnable to dialyze since Feb, catheter removed Monday     Musculoskeletal  (+) Arthritis ,   Abdominal   Peds  Hematology   Anesthesia Other Findings Past Medical History: No date: Arthritis No date: CAD (coronary artery disease)     Comment:  Cath 2008 total RCA with collaterals No date: Cancer [facility])     Comment:  kidney 08/2011: Chronic kidney disease     Comment:  kidney cancer No date: Diabetes mellitus No date: ESRD (end stage renal disease) ([place])     Comment:  On dialysis as of 04/2019 No date: Glaucoma No date: HTN (hypertension), benign No date: Hyperlipidemia 2000, 2007: Myocardial infarction ([place]) No date: Neuromuscular disorder ([place])     Comment:  neck & bilateral hands 1990: Pneumonia 40 years ago: Seizures ([place])     Comment:  pre eclampsia No date: Shortness of breath No date: Sleep apnea     Comment:  uses cpap Past Surgical History: No date: ABDOMINAL HYSTERECTOMY No date: CORONARY ANGIOPLASTY     Comment:  2 STENTS 04/06/2019: IR FLUORO GUIDE CV LINE RIGHT 08/24/2019: IR REMOVAL TUN CV CATH W/O  FL 04/06/2019: IR US GUIDE VASC ACCESS RIGHT 2000: nacrotizing faschitis; Left     Comment:  thigh into the groin and buttocks No date: NECK SURGERY 2013: ROBOTIC ASSITED PARTIAL NEPHRECTOMY; Left No date: ROTATOR CUFF REPAIR     Comment:  right arm   Reproductive/Obstetrics                           Anesthesia Physical Anesthesia Plan  ASA: IV  Anesthesia Plan: General   Post-op Pain Management:    Induction: Intravenous  PONV Risk Score and Plan: Ondansetron and Treatment may vary due to age or medical condition  Airway Management Planned: Oral ETT  Additional Equipment:   Intra-op Plan:   Post-operative Plan: Extubation in OR  Informed Consent: I have reviewed the patients History and Physical, chart, labs and discussed the procedure including the risks, benefits and alternatives for the proposed anesthesia with the patient or authorized representative who has indicated his/her understanding and acceptance.     Dental Advisory Given  Plan Discussed with: CRNA  Anesthesia Plan Comments:         Anesthesia Quick Evaluation

## 2019-08-27 NOTE — Transfer of Care (Signed)
Immediate Anesthesia Transfer of Care Note  Patient: [name]  Procedure(s) Performed: INSERTION OF DIALYSIS CATHETER (PD CATH INSERTION) (N/A Abdomen) LAPAROSCOPIC LYSIS OF ADHESIONS  Patient Location: PACU  Anesthesia Type:General  Level of Consciousness: awake, alert  and oriented  Airway & Oxygen Therapy: Patient Spontanous Breathing  Post-op Assessment: Report given to RN  Post vital signs: Reviewed and stable  Last Vitals:  Vitals Value Taken Time  BP 126/68 08/27/19 1308  Temp    Pulse 85 08/27/19 1308  Resp 17 08/27/19 1308  SpO2 98 % 08/27/19 1308    Last Pain:  Vitals:   08/27/19 1113  TempSrc: Temporal  PainSc: 0-No pain         Complications: No apparent anesthesia complications

## 2019-08-27 NOTE — Op Note (Addendum)
OPERATIVE NOTE   PROCEDURE: 1. Laparoscopic peritoneal dialysis catheter placement. 2. Extensive laparoscopic lysis of adhesions  PRE-OPERATIVE DIAGNOSIS: 1.  Chronic kidney disease stage V nearing dialysis dependence 2.  Multiple previous abdominal surgeries with extensive adhesions  POST-OPERATIVE DIAGNOSIS: Same  SURGEON: Leotis Pain, MD  ASSISTANT(S): Hezzie Bump, PA-C  ANESTHESIA: general  ESTIMATED BLOOD LOSS: 5 cc  FINDING(S): 1. Extensive abdominal adhesions that were taken down bluntly to facilitate placement of a catheter  SPECIMEN(S): None  INDICATIONS:  Patient presents with advanced chronic kidney disease nearing dialysis dependence.  She has been told by another institution that her abdominal adhesions would preclude peritoneal dialysis catheter placement and was unsure if she would be able to proceed with peritoneal dialysis.  She said she would not do hemodialysis, so she could not get a catheter in and she would die. The patient has decided to do peritoneal dialysis for his long-term dialysis. Risks and benefits of placement were discussed and he is agreeable to proceed.  Differences between peritoneal dialysis and hemodialysis were discussed.  An assistant was present during the procedure to help facilitate the exposure and expedite the procedure.  DESCRIPTION: After obtaining full informed written consent, the patient was brought back to the operating room and placed supine upon the operating table. The patient received IV antibiotics prior to induction. After obtaining adequate anesthesia, the abdomen was prepped and draped in the standard fashion. The assistant provided retraction and mobilization to help facilitate exposure and expedite the procedure throughout the entire procedure.  This included following suture, using retractors, and optimizing lighting. I then entered the peritoneum with an 26mm Optiview trocar placed in the right upper quadrant and  insufflated the abdomen with carbon dioxide.  This was done in the right upper quadrant where she did not have previous surgical scar and the Optiview trocar went easily.  On visualization, there were extensive abdominal wall adhesions particularly in the midline.  I started by placing a 5 mm trocar in the right lower quadrant and then began taking down the abdominal wall adhesions with a blunt grasper.  These were quite extensive, and this was done tediously taking care not to injure any bowel.  These were then taken down to the point we could see the pelvis which had moderate adhesions some of these were freed as well.  We now were able to see the left abdomen and left lower quadrant.  About 4 cm to the left of the umbilicus, I placed an 11 mm Optiview trocar under direct visualization and then placed the peritoneal dialysis catheter through this trocar and remove the trocar.  The peritoneal dialysis catheter was parked in the pelvis with the coiled portion using the camera to directly visualize this.  The cuff was parked at the abdominal fascia and a Vicryl pursestring suture was used to close over the catheter.  The catheter was pulled out in the left lower quadrant through a small stab counterincision and the superficial cuff was parked halfway between the periumbilical incision and the exit site. The appropriate distal connectors were placed, and I then placed 500 cc of saline through the catheter into the pelvis. The abdomen was desufflated. Immediately, 350-400 cc of effluent returned through the catheter when the bag was placed to gravity. I took one more look with the camera to ensure that the catheter was in the pelvis and it was. The 51mm trocar was then removed. I then closed the incisions with 3-0 Vicryl and 4-0 Monocryl and placed  Dermabond as dressing. Dry dressing was placed around the catheter exit site. The patient was then awakened from anesthesia and taken to the recovery room in stable  condition having tolerated the procedure well.  COMPLICATIONS: None  CONDITION: None  Leotis Pain, MD 08/27/2019 12:50 PM   This note was created with Dragon Medical transcription system. Any errors in dictation are purely unintentional.

## 2019-08-27 NOTE — Anesthesia Procedure Notes (Signed)
Procedure Name: Intubation Date/Time: 08/27/2019 11:50 AM Performed by: Louann Sjogren, CRNA Pre-anesthesia Checklist: Patient identified, Patient being monitored, Timeout performed, Emergency Drugs available and Suction available Patient Re-evaluated:Patient Re-evaluated prior to induction Oxygen Delivery Method: Circle system utilized Preoxygenation: Pre-oxygenation with 100% oxygen Induction Type: IV induction Ventilation: Mask ventilation without difficulty Laryngoscope Size: McGraph and 4 Grade View: Grade I Tube type: Oral Tube size: 7.0 mm Number of attempts: 1 Airway Equipment and Method: Stylet Placement Confirmation: ETT inserted through vocal cords under direct vision,  positive ETCO2 and breath sounds checked- equal and bilateral Secured at: 21 cm Tube secured with: Tape Dental Injury: Teeth and Oropharynx as per pre-operative assessment

## 2019-08-27 NOTE — Discharge Instructions (Signed)

## 2019-08-27 NOTE — Progress Notes (Signed)
Called patient's sister Ginger Allman and reviewed patient's discahrge instructions. Sister confirmed understanding of discharge instructions. Patient's neighbor will be staying with her this evening. Unable to call neighbor due to patient not having the neighbor's phone number.

## 2019-08-27 NOTE — Anesthesia Postprocedure Evaluation (Signed)
Anesthesia Post Note  Patient: JANVI AMMAR  Procedure(s) Performed: INSERTION OF DIALYSIS CATHETER (PD CATH INSERTION) (N/A Abdomen) LAPAROSCOPIC LYSIS OF ADHESIONS  Patient location during evaluation: PACU Anesthesia Type: General Level of consciousness: awake and alert Pain management: pain level controlled Vital Signs Assessment: post-procedure vital signs reviewed and stable Respiratory status: spontaneous breathing, nonlabored ventilation, respiratory function stable and patient connected to nasal cannula oxygen Cardiovascular status: blood pressure returned to baseline and stable Postop Assessment: no apparent nausea or vomiting Anesthetic complications: no     Last Vitals:  Vitals:   08/27/19 1409 08/27/19 1438  BP: (!) 133/50 (!) 178/83  Pulse: 86 89  Resp: 14 16  Temp: 36.4 C 36.6 C  SpO2: 95% 97%    Last Pain:  Vitals:   08/27/19 1438  TempSrc: Temporal  PainSc: 4                  Sheron Tallman Harvie Heck

## 2019-09-17 ENCOUNTER — Ambulatory Visit (INDEPENDENT_AMBULATORY_CARE_PROVIDER_SITE_OTHER): Payer: Medicare Other | Admitting: Nurse Practitioner

## 2019-10-08 NOTE — Progress Notes (Signed)
Date:  10/15/2019   ID:  Renard Matter, DOB Mar 30, 1951, MRN [id number]  PCP:  Lucia Gaskins, MD  Cardiologist:  Jenkins Rouge, MD  Electrophysiologist:  None   Evaluation Performed:  Follow-Up Visit  History of Present Illness:    [name] is a 69 y.o. female with a past medical history significant for CAD, MI in 2000 and 2007, hyperlipidemia, hypertension, diabetes type 2, CKD, kidney cancer, anemia of chronic disease and arthritis.  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  She was hospitalized for pancreatitis in 2013.  CT showed left renal mass 3.7 cm.  She had a subsequent left nephrectomy.  Her last Myoview in 12/2016 was low risk with EF 50%.  Had  laparoscopic PD catheter, omentopexy  08/27/19 Now on dialysis  With Cr 9.13   Doing well no cardiac symptoms     The patient does not have symptoms concerning for COVID-19 infection (fever, chills, cough, or new shortness of breath).    Past Medical History:  Diagnosis Date  . Arthritis   . CAD (coronary artery disease)    Cath 2008 total RCA with collaterals  . Cancer ([place])    kidney  . Chronic kidney disease 08/2011   kidney cancer  . Diabetes mellitus   . ESRD (end stage renal disease) ([place])    On dialysis as of 04/2019  . Glaucoma   . HTN (hypertension), benign   . Hyperlipidemia   . Myocardial infarction ([place]) 2000, 2007  . Neuromuscular disorder ([place])    neck & bilateral hands  . Pneumonia 1990  . Seizures ([place]) 40 years ago   pre eclampsia  . Shortness of breath   . Sleep apnea    uses cpap   Past Surgical History:  Procedure Laterality Date  . ABDOMINAL HYSTERECTOMY    . CORONARY ANGIOPLASTY     2 STENTS  . INSERTION OF DIALYSIS CATHETER N/A 08/27/2019   Procedure: INSERTION OF DIALYSIS CATHETER (PD CATH INSERTION);  Surgeon: Algernon Huxley, MD;  Location: [facility] ORS;  Service: Vascular;  Laterality:  N/A;  . IR FLUORO GUIDE CV LINE RIGHT  04/06/2019  . IR REMOVAL TUN CV CATH W/O FL  08/24/2019  . IR US GUIDE VASC ACCESS RIGHT  04/06/2019  . LAPAROSCOPIC LYSIS OF ADHESIONS  08/27/2019   Procedure: LAPAROSCOPIC LYSIS OF ADHESIONS;  Surgeon: Algernon Huxley, MD;  Location: [facility] ORS;  Service: Vascular;;  . 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 NECK SURGERY    . ROBOTIC ASSITED PARTIAL NEPHRECTOMY Left 2013  . ROTATOR CUFF REPAIR     right arm     Current Meds  Medication Sig  . amLODipine (NORVASC) 10 MG tablet Take 1 tablet by mouth every evening.   Marland Kitchen aspirin EC 81 MG tablet Take 81 mg by mouth daily.  . B Complex-C (B-COMPLEX WITH VITAMIN C) tablet Take 1 tablet by mouth every morning.   . Coenzyme Q10 (CO Q 10) 100 MG CAPS Take 1 tablet by mouth at bedtime.  Marland Kitchen ezetimibe-simvastatin (VYTORIN) 10-20 MG tablet Take 1 tablet by mouth at bedtime.  Marland Kitchen HYDROmorphone (DILAUDID) 4 MG tablet Take 4 mg by mouth every 4 (four) hours as needed for severe pain.  Marland Kitchen insulin glargine (LANTUS) 100 UNIT/ML injection Inject 30 Units into the skin at bedtime.   . isosorbide mononitrate (  IMDUR) 30 MG 24 hr tablet Take 30 mg by mouth every morning.   . metoprolol (TOPROL-XL) 100 MG 24 hr tablet Take 100 mg by mouth every morning.   . Multiple Vitamin (MULTIVITAMIN) tablet Take 1 tablet by mouth daily.   . nitroGLYCERIN (NITROSTAT) 0.4 MG SL tablet Place 0.4 mg under the tongue every 5 (five) minutes as needed for chest pain.  Marland Kitchen oxymetazoline (AFRIN) 0.05 % nasal spray Place 2 sprays into the nose 2 (two) times daily.  . Probiotic Product (PROBIOTIC DAILY PO) Take 1 tablet by mouth daily. gummy  . ramipril (ALTACE) 10 MG capsule Take 10 mg by mouth at bedtime.   . senna (SENOKOT) 8.6 MG tablet Take 1 tablet by mouth as needed for constipation.   Marland Kitchen Spirulina 500 MG TABS Take 500 mg by mouth 3 (three) times daily.  Marland Kitchen torsemide (DEMADEX) 20 MG tablet Take by mouth.  Marland Kitchen VITAMIN D  PO Take 1 tablet by mouth daily.     Allergies:   Insect extract allergy skin test, Other, Propine [dipivefrin], Travatan [travoprost], and Xalatan [latanoprost]   Social History   Tobacco Use  . Smoking status: Former Smoker    Packs/day: 0.50    Years: 20.00    Pack years: 10.00    Types: Cigarettes    Quit date: 05/08/1999    Years since quitting: 20.4  . Smokeless tobacco: Never Used  Vaping Use  . Vaping Use: Every day  . Substances: Nicotine  Substance Use Topics  . Alcohol use: Not Currently  . Drug use: No     Family Hx: The patient's family history includes Heart attack in her father.  ROS:   Please see the history of present illness.     All other systems reviewed and are negative.   Prior CV studies:   The following studies were reviewed today:  Myocardial perfusion imaging 12/18/2016  There was no ST segment deviation noted during stress.  Defect 1: There is a medium defect of moderate severity present in the basal anteroseptal, basal inferior, basal inferolateral, mid anteroseptal, mid inferior, mid inferolateral and apical inferior location, consistent with myocardial scar.  Findings consistent with prior myocardial infarction with a minimal degree of inferoapical peri-infarct ischemia.  This is a low risk study.  Nuclear stress EF: 50%.     Labs/Other Tests and Data Reviewed:    EKG:  SR LVH ? Old anterior MI 06/12/19   Recent Labs: 08/25/2019: BUN 61; Creatinine, Ser 9.13; Hemoglobin 10.0; Platelets 183; Potassium 4.5; Sodium 143   Recent Lipid Panel Lab Results  Component Value Date/Time   CHOL 221 06/07/2009 12:00 AM   TRIG 485 06/07/2009 12:00 AM   HDL 31 06/07/2009 12:00 AM    Wt Readings from Last 3 Encounters:  10/15/19 176 lb (79.8 kg)  08/27/19 188 lb 7.9 oz (85.5 kg)  08/04/19 181 lb 14.2 oz (82.5 kg)     Objective:    Vital Signs:  BP 134/68   Pulse 80   Ht 4\' 11"  (1.499 m)   Wt 176 lb (79.8 kg)   SpO2 96%   BMI 35.55  kg/m    Affect appropriate Chronically ill black female  HEENT: normal Neck supple with no adenopathy JVP normal no bruits no thyromegaly Lungs clear with no wheezing and good diaphragmatic motion Heart:  S1/S2 no murmur, no rub, gallop or click PMI normal Abdomen: benighn, previous surgeries PD catheter  Distal pulses intact with no bruits No edema Neuro  non-focal Skin warm and dry No muscular weakness   ASSESSMENT & PLAN:     ESRD -post left nephrectomy. -Followed by Dr. [name], [facility] started on hemodialysis in 04/2019 - PD dialysis   CAD -History of chronic total occlusion of the RCA with collaterals by cath in 2008.  Low risk Myoview in 2013.  Low risk Myoview in 2018 with inferior/inferior lateral scar, no significant ischemia, EF 50%. -Patient continues on medical therapy with aspirin 81 mg, statin, beta-blocker, long-acting nitrate, ACE inhibitor  Hypertension -On metoprolol, ramipril, Imdur, amlodipine -Blood pressure good at dialysis with no hypotension   Hyperlipidemia, LDL goal <70 -On Vytorin.  Management per PCP.  Diabetes on insulin -Followed by her PCP. -She reports being on keto diet for the last 3 years, although she does eat some bread. She reports better blood sugar control over the last year.    COVID-19 Education: The signs and symptoms of COVID-19 were discussed with the patient and how to seek care for testing (follow up with PCP or arrange E-visit).  The importance of social distancing was discussed today.     Medication Adjustments/Labs and Tests Ordered: Current medicines are reviewed at length with the patient today.  Concerns regarding medicines are outlined above.   Tests Ordered: No orders of the defined types were placed in this encounter.   Medication Changes: No orders of the defined types were placed in this encounter.   Follow Up:  In a year   Signed, Jenkins Rouge, MD  10/15/2019 10:31 AM    [place]

## 2019-10-15 ENCOUNTER — Other Ambulatory Visit: Payer: Self-pay

## 2019-10-15 ENCOUNTER — Encounter: Payer: Self-pay | Admitting: Cardiovascular Disease

## 2019-10-15 ENCOUNTER — Ambulatory Visit (INDEPENDENT_AMBULATORY_CARE_PROVIDER_SITE_OTHER): Payer: Medicare Other | Admitting: Cardiovascular Disease

## 2019-10-15 VITALS — BP 134/68 | HR 80 | Ht 59.0 in | Wt 176.0 lb

## 2019-10-15 DIAGNOSIS — I2510 Atherosclerotic heart disease of native coronary artery without angina pectoris: Secondary | ICD-10-CM

## 2019-10-15 NOTE — Patient Instructions (Addendum)
Medication Instructions:  Your physician recommends that you continue on your current medications as directed. Please refer to the Current Medication list given to you today.  *If you need a refill on your cardiac medications before your next appointment, please call your pharmacy*   Lab Work: None ordered   If you have labs (blood work) drawn today and your tests are completely normal, you will receive your results only by: Marland Kitchen MyChart Message (if you have MyChart) OR . A paper copy in the mail If you have any lab test that is abnormal or we need to change your treatment, we will call you to review the results.   Testing/Procedures: None odered    Follow-Up: At [facility], you and your health needs are our priority.  As part of our continuing mission to provide you with exceptional heart care, we have created designated Provider Care Teams.  These Care Teams include your primary Cardiologist (physician) and Advanced Practice Providers (APPs -  Physician Assistants and Nurse Practitioners) who all work together to provide you with the care you need, when you need it.  We recommend signing up for the patient portal called "MyChart".  Sign up information is provided on this After Visit Summary.  MyChart is used to connect with patients for Virtual Visits (Telemedicine).  Patients are able to view lab/test results, encounter notes, upcoming appointments, etc.  Non-urgent messages can be sent to your provider as well.   To learn more about what you can do with MyChart, go to [email or web address].    Your next appointment:   6 month(s)  The format for your next appointment:   In Person  Provider:   Jenkins Rouge, MD   Other Instructions None

## 2019-12-08 ENCOUNTER — Telehealth (INDEPENDENT_AMBULATORY_CARE_PROVIDER_SITE_OTHER): Payer: Self-pay

## 2019-12-08 NOTE — Telephone Encounter (Signed)
A fax was received from [facility] at [place] in [place] for this patient to have a permcath placed. Patient is scheduled with Dr. Delana Meyer with a 12:30 pm arrival time to the [facility] on 12/09/19, patient will do a covid test at 8:00 am at the [place] on 12/09/19 before her procedure. Pre-procedure instructions will be faxed to [facility] at [facility]. I did attempt to contact the patient and a message was left.

## 2019-12-09 ENCOUNTER — Other Ambulatory Visit (INDEPENDENT_AMBULATORY_CARE_PROVIDER_SITE_OTHER): Payer: Self-pay | Admitting: Nurse Practitioner

## 2019-12-09 ENCOUNTER — Encounter: Admission: RE | Payer: Self-pay | Source: Home / Self Care

## 2019-12-09 ENCOUNTER — Ambulatory Visit: Admission: RE | Admit: 2019-12-09 | Payer: Medicare Other | Source: Home / Self Care | Admitting: Vascular Surgery

## 2019-12-09 DIAGNOSIS — N186 End stage renal disease: Secondary | ICD-10-CM

## 2019-12-09 SURGERY — DIALYSIS/PERMA CATHETER INSERTION
Anesthesia: Moderate Sedation

## 2019-12-09 NOTE — Telephone Encounter (Signed)
Received a message  from Edie at [facility] stated they had not been able to make contact with the patient so she will not do a covid test today or have a procedure with Dr. Delana Meyer, these have been canceled. They would like to reschedule when able to speak with the patient.

## 2019-12-11 ENCOUNTER — Ambulatory Visit (INDEPENDENT_AMBULATORY_CARE_PROVIDER_SITE_OTHER): Payer: Medicare Other | Admitting: Orthopedic Surgery

## 2019-12-11 ENCOUNTER — Encounter: Payer: Self-pay | Admitting: Orthopedic Surgery

## 2019-12-11 ENCOUNTER — Other Ambulatory Visit: Payer: Self-pay

## 2019-12-11 VITALS — BP 178/85 | HR 107 | Ht 59.0 in | Wt 173.0 lb

## 2019-12-11 DIAGNOSIS — M65331 Trigger finger, right middle finger: Secondary | ICD-10-CM

## 2019-12-11 DIAGNOSIS — M65311 Trigger thumb, right thumb: Secondary | ICD-10-CM

## 2019-12-11 DIAGNOSIS — M65312 Trigger thumb, left thumb: Secondary | ICD-10-CM

## 2019-12-11 DIAGNOSIS — M65321 Trigger finger, right index finger: Secondary | ICD-10-CM

## 2019-12-11 DIAGNOSIS — M6530 Trigger finger, unspecified finger: Secondary | ICD-10-CM

## 2019-12-11 NOTE — Progress Notes (Signed)
Chief Complaint  Patient presents with  . Hand Pain    bilateral hand pain, thumb on left hand, and thumb, pointer and index on right    Requests injections x 5   1 left thumb  3 right thumb, index, long  Trigger finger injection  Diagnosis  Tenosynovitis x 3  Procedure injection A1 pulley right thumb Medications lidocaine 1% 1 mL and Depo-Medrol 40 mg 1 mL Skin prep alcohol and ethyl chloride Verbal consent was obtained Timeout confirmed the injection site  After cleaning the skin with alcohol and anesthetizing the skin with ethyl chloride the A1 pulley was palpated and the injection was performed without complication  Trigger finger injection Procedure injection A1 pulley right long finger  Medications lidocaine 1% 1 mL and Depo-Medrol 40 mg 1 mL Skin prep alcohol and ethyl chloride Verbal consent was obtained Timeout confirmed the injection site  After cleaning the skin with alcohol and anesthetizing the skin with ethyl chloride the A1 pulley was palpated and the injection was performed without complication   Trigger finger injection Procedure injection A1 pulley right index finger  Medications lidocaine 1% 1 mL and Depo-Medrol 40 mg 1 mL Skin prep alcohol and ethyl chloride Verbal consent was obtained Timeout confirmed the injection site  After cleaning the skin with alcohol and anesthetizing the skin with ethyl chloride the A1 pulley was palpated and the injection was performed without complication   Left Trigger thumb injection Medication  1 mL of 40 mg Depo-Medrol  2 mL of 1% lidocaine plain  Ethyl chloride for anesthesia  Verbal consent was obtained timeout was taken to confirm the injection site as left thumb  Alcohol was used to prepare the skin along with ethyl chloride and then the injection was made at the A1 pulley there were no complications   Encounter Diagnosis  Name Primary?  Marland Kitchen Acquired trigger finger: LT TH, RT TH, RIF, RLF  Yes

## 2019-12-11 NOTE — Telephone Encounter (Signed)
Patient called today and left a message refusing to have a permcath placed for her dialysis but she is willing to have surgery on her PD cath. Patient has a peritoneal dialysis cath which is not functioning very well and per the dialysis center she needs a permcath to be able to do dialysis. Patient needs an appt in our office to be seen regarding having her PD cath worked on. Patient will be called to get an appt in our office to be seen.

## 2019-12-15 ENCOUNTER — Telehealth (INDEPENDENT_AMBULATORY_CARE_PROVIDER_SITE_OTHER): Payer: Self-pay

## 2019-12-15 ENCOUNTER — Other Ambulatory Visit (INDEPENDENT_AMBULATORY_CARE_PROVIDER_SITE_OTHER): Payer: Self-pay | Admitting: Nurse Practitioner

## 2019-12-15 ENCOUNTER — Encounter
Admission: RE | Admit: 2019-12-15 | Discharge: 2019-12-15 | Disposition: A | Discharge status: Home / Self Care | Payer: Medicare Other | Source: Ambulatory Visit | Attending: Vascular Surgery | Admitting: Vascular Surgery

## 2019-12-15 ENCOUNTER — Other Ambulatory Visit: Payer: Self-pay

## 2019-12-15 NOTE — Telephone Encounter (Signed)
Spoke with Stanton Kidney at [facility], we discussed the patient being scheduled for a PD cath revision on 12/17/19 with Dr. Lucky Cowboy, pre-op phone call today and covid testing on 12/16/19 between 8-1 pm. We also discussed that the patient will need to call  [facility] tomorrow between 1-3 to receive her arrival time to the [facility]. Per Stanton Kidney the patient has a hard time with transportation. Pre-surgical instructions will be faxed to [facility] at [facility].

## 2019-12-15 NOTE — Patient Instructions (Signed)
COVID TESTING Date: December 16, 2019 Wednesday  Testing site:  [place] Thru Hours:  4:16 am - 1:00 pm Once you are tested, you are asked to stay quarantined (avoiding public places) until after your surgery.   Your procedure is scheduled on: AUGUST 12, Thursday  Report to Day Surgery on the 2nd floor of the [facility]. To find out your arrival time, please call [phone] between 1PM - 3PM on: Wednesday December 16, 2019  REMEMBER: Instructions that are not followed completely may result in serious medical risk, up to and including death; or upon the discretion of your surgeon and anesthesiologist your surgery may need to be rescheduled.  Do not eat food after midnight the night before surgery.  No gum chewing, lozengers or hard candies.  You may however, drink CLEAR liquids up to 2 hours before you are scheduled to arrive for your surgery. Do not drink anything within 2 hours of your scheduled arrival time.  Clear liquids include: - water    Do NOT drink anything that is not on this list.  Type 1 and Type 2 diabetics should only drink water.   TAKE THESE MEDICATIONS THE MORNING OF SURGERY WITH A SIP OF WATER: ISOSORBIDE METOPROLOL  Take 1/2 of usual insulin dose the night before surgery and none on the morning of surgery.  Stop Anti-inflammatories (NSAIDS) such as Advil, Aleve, Ibuprofen, Motrin, Naproxen, Naprosyn and ASPIRIN AND Aspirin based products such as Excedrin, Goodys Powder, BC Powder. (May take Tylenol or Acetaminophen if needed.)  Stop ANY OVER THE COUNTER supplements until after surgery. STOP COQ10 (May continue Vitamin D, Vitamin B, and multivitamin.)  No Alcohol for 24 hours before or after surgery.  No Smoking including e-cigarettes for 24 hours prior to surgery.  No chewable tobacco products for at least 6 hours prior to surgery.  No nicotine patches on the day of surgery.  Do not use any  "recreational" drugs for at least a week prior to your surgery.  Please be advised that the combination of cocaine and anesthesia may have negative outcomes, up to and including death. If you test positive for cocaine, your surgery will be cancelled.  On the morning of surgery brush your teeth with toothpaste and water, you may rinse your mouth with mouthwash if you wish. Do not swallow any toothpaste or mouthwash.  Do not wear jewelry, make-up, hairpins, clips or nail polish.  Do not wear lotions, powders, or perfumes.   Do not shave 48 hours prior to surgery.   Contact lenses, hearing aids and dentures may not be worn into surgery.  Do not bring valuables to the hospital. [facility] is not responsible for any missing/lost belongings or valuables.   Use CHG Soap as directed on instruction sheet.  Bring your C-PAP to the hospital with you in case you may have to spend the night.   Notify your doctor if there is any change in your medical condition (cold, fever, infection).  Wear comfortable clothing (specific to your surgery type) to the hospital.  Plan for stool softeners for home use; pain medications have a tendency to cause constipation. You can also help prevent constipation by eating foods high in fiber such as fruits and vegetables and drinking plenty of fluids as your diet allows.  After surgery, you can help prevent lung complications by doing breathing exercises.  Take deep breaths and cough every 1-2 hours. Your doctor may order a device called an  Incentive Spirometer to help you take deep breaths. When coughing or sneezing, hold a pillow firmly against your incision with both hands. This is called "splinting." Doing this helps protect your incision. It also decreases belly discomfort.   If you are being discharged the day of surgery, you will not be allowed to drive home. You will need a responsible adult (18 years or older) to drive you home and stay with you that  night.   Please call the [place] Dept. at [phone] if you have any questions about these instructions.  Visitation Policy:  Patients undergoing a surgery or procedure may have one family member or support person with them as long as that person is not COVID-19 positive or experiencing its symptoms.  That person may remain in the waiting area during the procedure.  Children under 25 years of age may have both parents or legal guardians with them during their procedure.  Inpatient Visitation Update:   In an effort to ensure the safety of our team members and our patients, we are implementing a change to our visitation policy:  Effective Monday, Aug. 9, at 7 a.m., inpatients will be allowed one support person.  o The support person may change daily.  o The support person must pass our screening, gel in and out, and wear a mask at all times, including in the patient's room.  o Patients must also wear a mask when staff or their support person are in the room.  o Masking is required regardless of vaccination status.  Systemwide, no visitors 17 or younger.

## 2019-12-16 ENCOUNTER — Other Ambulatory Visit: Admission: RE | Admit: 2019-12-16 | Payer: Medicare Other | Source: Ambulatory Visit

## 2019-12-16 NOTE — Telephone Encounter (Signed)
A voicemail was left by [name] at [facility] stating the patient could not do covid testing today for her surgery and wanted it rescheduled to Friday. Patient has been rescheduled to Friday for her surgery and covid testing on 12/17/19 before 1:00 pm. I called and spoke with the patient who stated she will be there.

## 2019-12-17 ENCOUNTER — Other Ambulatory Visit
Admission: RE | Admit: 2019-12-17 | Discharge: 2019-12-17 | Disposition: A | Discharge status: Home / Self Care | Payer: Medicare Other | Source: Ambulatory Visit | Attending: Vascular Surgery | Admitting: Vascular Surgery

## 2019-12-17 ENCOUNTER — Encounter: Payer: Self-pay | Admitting: Urgent Care

## 2019-12-17 ENCOUNTER — Ambulatory Visit (INDEPENDENT_AMBULATORY_CARE_PROVIDER_SITE_OTHER): Payer: Medicare Other | Admitting: Nurse Practitioner

## 2019-12-17 ENCOUNTER — Other Ambulatory Visit: Payer: Self-pay

## 2019-12-17 DIAGNOSIS — Z20822 Contact with and (suspected) exposure to covid-19: Secondary | ICD-10-CM | POA: Diagnosis not present

## 2019-12-17 DIAGNOSIS — Z01812 Encounter for preprocedural laboratory examination: Secondary | ICD-10-CM | POA: Diagnosis present

## 2019-12-18 ENCOUNTER — Ambulatory Visit
Admission: RE | Admit: 2019-12-18 | Discharge: 2019-12-18 | Disposition: A | Discharge status: Home / Self Care | Payer: Medicare Other | Attending: Vascular Surgery | Admitting: Vascular Surgery

## 2019-12-18 ENCOUNTER — Encounter
Admission: RE | Disposition: A | Discharge status: Home / Self Care | Payer: Self-pay | Source: Home / Self Care | Attending: Vascular Surgery

## 2019-12-18 ENCOUNTER — Encounter: Payer: Self-pay | Admitting: Vascular Surgery

## 2019-12-18 ENCOUNTER — Ambulatory Visit: Payer: Medicare Other | Admitting: Certified Registered"

## 2019-12-18 ENCOUNTER — Other Ambulatory Visit: Payer: Self-pay

## 2019-12-18 DIAGNOSIS — E119 Type 2 diabetes mellitus without complications: Secondary | ICD-10-CM

## 2019-12-18 DIAGNOSIS — Z85528 Personal history of other malignant neoplasm of kidney: Secondary | ICD-10-CM | POA: Insufficient documentation

## 2019-12-18 DIAGNOSIS — N186 End stage renal disease: Secondary | ICD-10-CM | POA: Diagnosis not present

## 2019-12-18 DIAGNOSIS — T85611A Breakdown (mechanical) of intraperitoneal dialysis catheter, initial encounter: Secondary | ICD-10-CM | POA: Diagnosis not present

## 2019-12-18 DIAGNOSIS — I120 Hypertensive chronic kidney disease with stage 5 chronic kidney disease or end stage renal disease: Secondary | ICD-10-CM | POA: Insufficient documentation

## 2019-12-18 DIAGNOSIS — N185 Chronic kidney disease, stage 5: Secondary | ICD-10-CM

## 2019-12-18 DIAGNOSIS — I2510 Atherosclerotic heart disease of native coronary artery without angina pectoris: Secondary | ICD-10-CM | POA: Diagnosis not present

## 2019-12-18 DIAGNOSIS — Z992 Dependence on renal dialysis: Secondary | ICD-10-CM | POA: Insufficient documentation

## 2019-12-18 DIAGNOSIS — E1122 Type 2 diabetes mellitus with diabetic chronic kidney disease: Secondary | ICD-10-CM | POA: Diagnosis not present

## 2019-12-18 DIAGNOSIS — G4730 Sleep apnea, unspecified: Secondary | ICD-10-CM | POA: Insufficient documentation

## 2019-12-18 DIAGNOSIS — Z794 Long term (current) use of insulin: Secondary | ICD-10-CM | POA: Insufficient documentation

## 2019-12-18 DIAGNOSIS — M1990 Unspecified osteoarthritis, unspecified site: Secondary | ICD-10-CM | POA: Insufficient documentation

## 2019-12-18 DIAGNOSIS — Z87891 Personal history of nicotine dependence: Secondary | ICD-10-CM | POA: Insufficient documentation

## 2019-12-18 DIAGNOSIS — E875 Hyperkalemia: Secondary | ICD-10-CM | POA: Diagnosis not present

## 2019-12-18 DIAGNOSIS — I252 Old myocardial infarction: Secondary | ICD-10-CM | POA: Diagnosis not present

## 2019-12-18 DIAGNOSIS — Y831 Surgical operation with implant of artificial internal device as the cause of abnormal reaction of the patient, or of later complication, without mention of misadventure at the time of the procedure: Secondary | ICD-10-CM | POA: Insufficient documentation

## 2019-12-18 HISTORY — PX: CAPD INSERTION: SHX5233

## 2019-12-18 HISTORY — PX: CAPD REMOVAL: SHX5234

## 2019-12-18 LAB — POCT I-STAT, CHEM 8
BUN: 75 mg/dL — ABNORMAL HIGH (ref 8–23)
Calcium, Ion: 1.21 mmol/L (ref 1.15–1.40)
Chloride: 110 mmol/L (ref 98–111)
Creatinine, Ser: 11.4 mg/dL — ABNORMAL HIGH (ref 0.44–1.00)
Glucose, Bld: 142 mg/dL — ABNORMAL HIGH (ref 70–99)
HCT: 22 % — ABNORMAL LOW (ref 36.0–46.0)
Hemoglobin: 7.5 g/dL — ABNORMAL LOW (ref 12.0–15.0)
Potassium: 5.6 mmol/L — ABNORMAL HIGH (ref 3.5–5.1)
Sodium: 142 mmol/L (ref 135–145)
TCO2: 24 mmol/L (ref 22–32)

## 2019-12-18 LAB — SARS CORONAVIRUS 2 (TAT 6-24 HRS): SARS Coronavirus 2: NEGATIVE

## 2019-12-18 LAB — GLUCOSE, CAPILLARY: Glucose-Capillary: 149 mg/dL — ABNORMAL HIGH (ref 70–99)

## 2019-12-18 SURGERY — LAPAROSCOPIC INSERTION CONTINUOUS AMBULATORY PERITONEAL DIALYSIS  (CAPD) CATHETER
Anesthesia: General | Laterality: Left

## 2019-12-18 MED ORDER — FAMOTIDINE 20 MG PO TABS: 20.0000 mg | ORAL_TABLET | Freq: Once | ORAL | Status: AC

## 2019-12-18 MED ORDER — FENTANYL CITRATE (PF) 100 MCG/2ML IJ SOLN
INTRAMUSCULAR | Status: DC | PRN
  Administered 2019-12-18 (×2): 50 ug via INTRAVENOUS

## 2019-12-18 MED ORDER — CEFAZOLIN SODIUM-DEXTROSE 1-4 GM/50ML-% IV SOLN
INTRAVENOUS | Status: AC
  Filled 2019-12-18: qty 50

## 2019-12-18 MED ORDER — FENTANYL CITRATE (PF) 100 MCG/2ML IJ SOLN
INTRAMUSCULAR | Status: AC
  Filled 2019-12-18: qty 2

## 2019-12-18 MED ORDER — CHLORHEXIDINE GLUCONATE 0.12 % MT SOLN: 15.0000 mL | Freq: Once | OROMUCOSAL | Status: AC

## 2019-12-18 MED ORDER — ONDANSETRON HCL 4 MG/2ML IJ SOLN
4.0000 mg | Freq: Four times a day (QID) | INTRAMUSCULAR | Status: DC | PRN
  Administered 2019-12-18: 4 mg via INTRAVENOUS

## 2019-12-18 MED ORDER — BACITRACIN ZINC 500 UNIT/GM EX OINT
TOPICAL_OINTMENT | CUTANEOUS | Status: AC
  Filled 2019-12-18: qty 28.35

## 2019-12-18 MED ORDER — OXYCODONE HCL 5 MG/5ML PO SOLN: 5.0000 mg | Freq: Once | ORAL | Status: DC | PRN

## 2019-12-18 MED ORDER — ORAL CARE MOUTH RINSE: 15.0000 mL | Freq: Once | OROMUCOSAL | Status: AC

## 2019-12-18 MED ORDER — FENTANYL CITRATE (PF) 100 MCG/2ML IJ SOLN
INTRAMUSCULAR | Status: AC
  Administered 2019-12-18: 25 ug via INTRAVENOUS
  Filled 2019-12-18: qty 2

## 2019-12-18 MED ORDER — CHLORHEXIDINE GLUCONATE CLOTH 2 % EX PADS: 6.0000 | MEDICATED_PAD | Freq: Once | CUTANEOUS | Status: DC

## 2019-12-18 MED ORDER — FAMOTIDINE 20 MG PO TABS
ORAL_TABLET | ORAL | Status: AC
  Administered 2019-12-18: 20 mg via ORAL
  Filled 2019-12-18: qty 1

## 2019-12-18 MED ORDER — CEFAZOLIN SODIUM-DEXTROSE 1-4 GM/50ML-% IV SOLN
1.0000 g | INTRAVENOUS | Status: AC
  Administered 2019-12-18: 1 g via INTRAVENOUS

## 2019-12-18 MED ORDER — PROPOFOL 10 MG/ML IV BOLUS
INTRAVENOUS | Status: AC
  Filled 2019-12-18: qty 20

## 2019-12-18 MED ORDER — HYDROCODONE-ACETAMINOPHEN 5-325 MG PO TABS: 1.0000 | ORAL_TABLET | Freq: Four times a day (QID) | ORAL | 0 refills | Status: DC | PRN

## 2019-12-18 MED ORDER — SUGAMMADEX SODIUM 500 MG/5ML IV SOLN
INTRAVENOUS | Status: AC
  Filled 2019-12-18: qty 5

## 2019-12-18 MED ORDER — FENTANYL CITRATE (PF) 100 MCG/2ML IJ SOLN
25.0000 ug | INTRAMUSCULAR | Status: DC | PRN
  Administered 2019-12-18 (×2): 50 ug via INTRAVENOUS
  Administered 2019-12-18: 25 ug via INTRAVENOUS

## 2019-12-18 MED ORDER — CHLORHEXIDINE GLUCONATE 0.12 % MT SOLN
OROMUCOSAL | Status: AC
  Administered 2019-12-18: 15 mL via OROMUCOSAL
  Filled 2019-12-18: qty 15

## 2019-12-18 MED ORDER — ROCURONIUM BROMIDE 10 MG/ML (PF) SYRINGE
PREFILLED_SYRINGE | INTRAVENOUS | Status: AC
  Filled 2019-12-18: qty 10

## 2019-12-18 MED ORDER — PHENYLEPHRINE HCL (PRESSORS) 10 MG/ML IV SOLN
INTRAVENOUS | Status: DC | PRN
  Administered 2019-12-18: 100 ug via INTRAVENOUS

## 2019-12-18 MED ORDER — ROCURONIUM BROMIDE 100 MG/10ML IV SOLN
INTRAVENOUS | Status: DC | PRN
  Administered 2019-12-18: 100 mg via INTRAVENOUS

## 2019-12-18 MED ORDER — ONDANSETRON HCL 4 MG/2ML IJ SOLN
INTRAMUSCULAR | Status: AC
  Filled 2019-12-18: qty 2

## 2019-12-18 MED ORDER — FENTANYL CITRATE (PF) 100 MCG/2ML IJ SOLN
INTRAMUSCULAR | Status: AC
  Administered 2019-12-18: 50 ug via INTRAVENOUS
  Filled 2019-12-18: qty 2

## 2019-12-18 MED ORDER — PROPOFOL 10 MG/ML IV BOLUS
INTRAVENOUS | Status: DC | PRN
  Administered 2019-12-18: 120 mg via INTRAVENOUS

## 2019-12-18 MED ORDER — SUGAMMADEX SODIUM 500 MG/5ML IV SOLN
INTRAVENOUS | Status: DC | PRN
  Administered 2019-12-18: 300 mg via INTRAVENOUS

## 2019-12-18 MED ORDER — LIDOCAINE HCL (CARDIAC) PF 100 MG/5ML IV SOSY
PREFILLED_SYRINGE | INTRAVENOUS | Status: DC | PRN
  Administered 2019-12-18: 100 mg via INTRAVENOUS

## 2019-12-18 MED ORDER — HYDROMORPHONE HCL 1 MG/ML IJ SOLN: 1.0000 mg | Freq: Once | INTRAMUSCULAR | Status: DC | PRN

## 2019-12-18 MED ORDER — SODIUM CHLORIDE 0.9 % IV SOLN: INTRAVENOUS | Status: DC

## 2019-12-18 MED ORDER — OXYCODONE HCL 5 MG PO TABS: 5.0000 mg | ORAL_TABLET | Freq: Once | ORAL | Status: DC | PRN

## 2019-12-18 MED ORDER — LIDOCAINE HCL (PF) 2 % IJ SOLN
INTRAMUSCULAR | Status: AC
  Filled 2019-12-18: qty 5

## 2019-12-18 SURGICAL SUPPLY — 45 items
ADAPTER BETA CAP QUINTON DIALY (ADAPTER) ×3 IMPLANT
ADAPTER CATH DIALYSIS 18.75 (CATHETERS) ×3 IMPLANT
ADAPTER CATH DIALYSIS 18.75CM (CATHETERS) ×1
ADH SKN CLS APL DERMABOND .7 (GAUZE/BANDAGES/DRESSINGS) ×2
ADPR DLYS BCP STRL PRTNL ULTEM (ADAPTER) ×2
APL PRP STRL LF DISP 70% ISPRP (MISCELLANEOUS) ×2
CANISTER SUCT 1200ML W/VALVE (MISCELLANEOUS) ×4 IMPLANT
CATH DLYS SWAN NECK 62.5CM (CATHETERS) ×4 IMPLANT
CHLORAPREP W/TINT 26 (MISCELLANEOUS) ×4 IMPLANT
COVER WAND RF STERILE (DRAPES) ×1 IMPLANT
DERMABOND ADVANCED (GAUZE/BANDAGES/DRESSINGS) ×2
DERMABOND ADVANCED .7 DNX12 (GAUZE/BANDAGES/DRESSINGS) ×2 IMPLANT
DRSG TEGADERM 4X4.75 (GAUZE/BANDAGES/DRESSINGS) ×3 IMPLANT
DRSG TELFA 4X3 1S NADH ST (GAUZE/BANDAGES/DRESSINGS) ×3 IMPLANT
ELECT CAUTERY BLADE 6.4 (BLADE) ×4 IMPLANT
ELECT REM PT RETURN 9FT ADLT (ELECTROSURGICAL) ×4
ELECTRODE REM PT RTRN 9FT ADLT (ELECTROSURGICAL) ×2 IMPLANT
GLOVE BIO SURGEON STRL SZ7 (GLOVE) ×11 IMPLANT
GLOVE INDICATOR 7.5 STRL GRN (GLOVE) ×7 IMPLANT
GOWN STRL REUS W/ TWL LRG LVL3 (GOWN DISPOSABLE) ×4 IMPLANT
GOWN STRL REUS W/ TWL XL LVL3 (GOWN DISPOSABLE) ×2 IMPLANT
GOWN STRL REUS W/TWL LRG LVL3 (GOWN DISPOSABLE) ×8
GOWN STRL REUS W/TWL XL LVL3 (GOWN DISPOSABLE) ×4
IRRIGATION STRYKERFLOW (MISCELLANEOUS) ×1 IMPLANT
IRRIGATOR STRYKERFLOW (MISCELLANEOUS) ×4
IV NS 1000ML (IV SOLUTION) ×4
IV NS 1000ML BAXH (IV SOLUTION) ×1 IMPLANT
IV NS 500ML (IV SOLUTION) ×8
IV NS 500ML BAXH (IV SOLUTION) ×3 IMPLANT
KIT TURNOVER KIT A (KITS) ×4 IMPLANT
LABEL OR SOLS (LABEL) ×4 IMPLANT
MINICAP W/POVIDONE IODINE SOL (MISCELLANEOUS) ×4 IMPLANT
PACK LAP CHOLECYSTECTOMY (MISCELLANEOUS) ×4 IMPLANT
PENCIL ELECTRO HAND CTR (MISCELLANEOUS) ×4 IMPLANT
SET CYSTO W/LG BORE CLAMP LF (SET/KITS/TRAYS/PACK) ×7 IMPLANT
SET TRANSFER 6 W/TWIST CLAMP 5 (SET/KITS/TRAYS/PACK) ×4 IMPLANT
SET TUBE SMOKE EVAC HIGH FLOW (TUBING) ×4 IMPLANT
SLEEVE ENDOPATH XCEL 5M (ENDOMECHANICALS) ×3 IMPLANT
SPONGE DRAIN TRACH 4X4 STRL 2S (GAUZE/BANDAGES/DRESSINGS) ×4 IMPLANT
SUT MNCRL AB 4-0 PS2 18 (SUTURE) ×4 IMPLANT
SUT VIC AB 2-0 UR6 27 (SUTURE) ×4 IMPLANT
SUT VICRYL+ 3-0 36IN CT-1 (SUTURE) ×4 IMPLANT
TROCAR XCEL NON-BLD 11X100MML (ENDOMECHANICALS) ×4 IMPLANT
TROCAR XCEL NON-BLD 5MMX100MML (ENDOMECHANICALS) IMPLANT
TROCAR XCEL UNIV SLVE 11M 100M (ENDOMECHANICALS) ×3 IMPLANT

## 2019-12-18 NOTE — Discharge Instructions (Signed)
Peritoneal Dialysis Catheter Placement, Care After This sheet gives you information about how to care for yourself after your procedure. Your health care provider may also give you more specific instructions. If you have problems or questions, contact your health care provider. What can I expect after the procedure?  After the procedure, it is common to have some pain or discomfort in your abdomen, your incision area, or both.  You may need to wait 2 weeks after your procedure before you can start peritoneal dialysis treatment. If you need dialysis before that time, your health care provider may begin peritoneal dialysis treatment early or offer kidney dialysis treatments (hemodialysis) until you heal. Follow these instructions at home: Incision care   Follow instructions from your health care provider about how to take care of your incision(s). Make sure you: ? Change your dressing only as told by your health care provider. Your health care provider may tell you to not touch or change your dressing. ? Wash your hands with soap and water before you change your bandage (dressing). If soap and water are not available, use hand sanitizer. ? Leave stitches (sutures), skin glue, or adhesive strips in place. These skin closures may need to stay in place for two weeks or longer. If adhesive strip edges start to loosen and curl up, you may trim the loose edges. Do not remove adhesive strips completely unless your health care provider tells you to do that.  Check your incision area(s) every day for signs of infection. (If you were instructed to not touch or change your dressing, look at your dressing for signs of infection.) Check for: ? Redness, swelling, or more pain. ? Fluid or blood. ? Warmth. ? Pus or a bad smell. Medicines  Take over-the-counter and prescription medicines only as told by your health care provider.  If you were prescribed an antibiotic medicine, take it as told by your health  care provider. Do not stop using the antibiotic even if your condition improves. Driving  Do not drive or ride in a car until your health care provider approves. Your seat belt could move the catheter out of position or cause irritation by rubbing on your incision.  Do not drive or use heavy machinery while taking prescription pain medicine. Activity  Rest and limit your activity. Return to your normal activities as told by your health care provider. Ask your health care provider what activities are safe for you.  Do not lift anything that is heavier than 10 lb (4.5 kg), or the limit that you are told, until your health care provider says that it is safe. Preventing constipation  To prevent or treat constipation, your health care provider may recommend that you: ? Take over-the-counter or prescription medicines. ? Eat foods that are high in fiber, such as fresh fruits and vegetables, whole grains, and beans. ? Limit foods that are high in fat and processed sugars, such as fried and sweet foods. General instructions  Do not use any products that contain nicotine or tobacco, such as cigarettes and e-cigarettes. If you need help quitting, ask your health care provider.  Follow instructions from your health care provider about eating or drinking restrictions.  Do not take baths, swim, or use a hot tub until your health care provider approves. Ask your health care provider if you may take showers. You may only be allowed to take sponge baths.  Wear loose-fitting clothing that keeps the catheter covered so that it cannot get caught on something.  Keep your catheter clean and dry.  Keep all follow-up visits as told by your health care provider. This is important. Contact a health care provider if:  You have a fever.  You have redness, swelling, or more pain around an incision.  You have fluid or blood coming from an incision.  An incision feels warm to the touch.  You have pus or a  bad smell coming from an incision.  You cannot eat or drink without vomiting. Get help right away if:  You have problems breathing.  You are confused.  You have trouble speaking.  You have severe pain in your abdomen that does not get better with treatment.  You have bright red blood in your stool (feces), or your stool is dark black and looks like tar. These symptoms may represent a serious problem that is an emergency. Do not wait to see if the symptoms will go away. Get medical help right away. Call your local emergency services (911 in the [place] Do not drive yourself to the hospital. Summary  After the procedure, it is common to have some pain or discomfort in your abdomen, your incision, or both.  You may have to wait 2 weeks after your procedure before you can start peritoneal dialysis treatment.  Check your incision area(s) every day for signs of infection.  Get medical help right away if you have severe pain in your abdomen that does not get better with treatment. This information is not intended to replace advice given to you by your health care provider. Make sure you discuss any questions you have with your health care provider. Document Revised: 08/14/2018 Document Reviewed: 10/26/2016 Elsevier Patient Education  2020 [employer].  Per Dr. Lucky Cowboy may start using dialysis catheter tomorrow.  AMBULATORY SURGERY  DISCHARGE INSTRUCTIONS   1) The drugs that you were given will stay in your system until tomorrow so for the next 24 hours you should not:  A) Drive an automobile B) Make any legal decisions C) Drink any alcoholic beverage   2) You may resume regular meals tomorrow.  Today it is better to start with liquids and gradually work up to solid foods.  You may eat anything you prefer, but it is better to start with liquids, then soup and crackers, and gradually work up to solid foods.   3) Please notify your doctor immediately if you have any unusual bleeding,  trouble breathing, redness and pain at the surgery site, drainage, fever, or pain not relieved by medication.    4) Additional Instructions:        Please contact your physician with any problems or Same Day Surgery at [phone], Monday through Friday 6 am to 4 pm, or [place] at [facility] number at [phone].

## 2019-12-18 NOTE — Anesthesia Procedure Notes (Signed)
Procedure Name: Intubation Date/Time: 12/18/2019 7:35 AM Performed by: Lerry Liner, CRNA Pre-anesthesia Checklist: Patient identified, Emergency Drugs available, Suction available, Patient being monitored and Timeout performed Patient Re-evaluated:Patient Re-evaluated prior to induction Oxygen Delivery Method: Circle system utilized Preoxygenation: Pre-oxygenation with 100% oxygen Induction Type: IV induction Ventilation: Mask ventilation without difficulty Laryngoscope Size: McGraph and 3 Grade View: Grade I Tube type: Oral Tube size: 6.5 mm Number of attempts: 1 Airway Equipment and Method: Stylet Placement Confirmation: ETT inserted through vocal cords under direct vision Secured at: 21 cm Tube secured with: Tape

## 2019-12-18 NOTE — Transfer of Care (Signed)
Immediate Anesthesia Transfer of Care Note  Patient: [name]  Procedure(s) Performed: LAPAROSCOPIC INSERTION CONTINUOUS AMBULATORY PERITONEAL DIALYSIS  (CAPD) CATHETER (Left ) LAPAROSCOPIC REMOVAL CONTINUOUS AMBULATORY PERITONEAL DIALYSIS  (CAPD) CATHETER (Left )  Patient Location: PACU  Anesthesia Type:General  Level of Consciousness: awake  Airway & Oxygen Therapy: Patient Spontanous Breathing and Patient connected to face mask oxygen  Post-op Assessment: Report given to RN  Post vital signs: stable  Last Vitals:  Vitals Value Taken Time  BP    Temp    Pulse    Resp    SpO2      Last Pain: There were no vitals filed for this visit.       Complications: No complications documented.

## 2019-12-18 NOTE — OR Nursing (Signed)
Per Dr. Lucky Cowboy, secure chat, ok for pt to resume aspirin today 12/18/19; added to d/c instructions/med section.

## 2019-12-18 NOTE — H&P (Signed)
[facility] Admission History & Physical  MRN : [id number]  [name] is a 69 y.o. ([birth year]) female who presents with chief complaint of No chief complaint on file. Marland Kitchen  History of Present Illness: patient presents with a non-functional PD catheter, which is her only option for dialysis. Has not worked well in a few weeks.  Has been taking medicine to limit K but still 5.6 today.  Increased fluid on board as well. Overall understands the grave nature of the situation.  Current Facility-Administered Medications  Medication Dose Route Frequency Provider Last Rate Last Admin  . 0.9 %  sodium chloride infusion   Intravenous Continuous Alvin Critchley, MD 10 mL/hr at 12/18/19 0712 New Bag at 12/18/19 3086  . ceFAZolin (ANCEF) 1-4 GM/50ML-% IVPB           . ceFAZolin (ANCEF) IVPB 1 g/50 mL premix  1 g Intravenous On Call to [place], [place], NP      . Chlorhexidine Gluconate Cloth 2 % PADS 6 each  6 each Topical Once Kris Hartmann, NP       And  . Chlorhexidine Gluconate Cloth 2 % PADS 6 each  6 each Topical Once Eulogio Ditch E, NP      . HYDROmorphone (DILAUDID) injection 1 mg  1 mg Intravenous Once PRN Kris Hartmann, NP      . ondansetron (ZOFRAN) injection 4 mg  4 mg Intravenous Q6H PRN Kris Hartmann, NP        Past Medical History:  Diagnosis Date  . Arthritis   . CAD (coronary artery disease)    Cath 2008 total RCA with collaterals  . Cancer ([place])    kidney  . Chronic kidney disease 08/2011   kidney cancer  . Diabetes mellitus   . ESRD (end stage renal disease) ([place])    On dialysis as of 04/2019  . Glaucoma    BILATERAL  . HTN (hypertension), benign   . Hyperlipidemia   . Myocardial infarction ([place]) 2000, 2007  . Neuromuscular disorder ([place])    neck & bilateral hands  . Pneumonia 1990  . Seizures ([place]) 40 years ago   pre eclampsia  . Shortness of breath   . Sleep apnea    uses cpap    Past Surgical History:  Procedure Laterality  Date  . ABDOMINAL HYSTERECTOMY    . CORONARY ANGIOPLASTY     2 STENTS  . INSERTION OF DIALYSIS CATHETER N/A 08/27/2019   Procedure: INSERTION OF DIALYSIS CATHETER (PD CATH INSERTION);  Surgeon: Algernon Huxley, MD;  Location: [facility] ORS;  Service: Vascular;  Laterality: N/A;  . IR FLUORO GUIDE CV LINE RIGHT  04/06/2019  . IR REMOVAL TUN CV CATH W/O FL  08/24/2019  . IR US GUIDE VASC ACCESS RIGHT  04/06/2019  . LAPAROSCOPIC LYSIS OF ADHESIONS  08/27/2019   Procedure: LAPAROSCOPIC LYSIS OF ADHESIONS;  Surgeon: Algernon Huxley, MD;  Location: [facility] ORS;  Service: Vascular;;  . 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 NECK SURGERY    . ROBOTIC ASSITED PARTIAL NEPHRECTOMY Left 2013  . ROTATOR CUFF REPAIR     right arm     Social History   Tobacco Use  . Smoking status: Former Smoker    Packs/day: 0.50    Years: 20.00    Pack years: 10.00    Types: Cigarettes    Quit date: 05/08/1999    Years since  quitting: 20.6  . Smokeless tobacco: Former Systems developer    Types: Snuff  Vaping Use  . Vaping Use: Every day  . Substances: Nicotine  Substance Use Topics  . Alcohol use: Not Currently  . Drug use: No     Family History  Problem Relation Age of Onset  . Heart attack Father   no bleeding or clotting disorders   Allergies  Allergen Reactions  . Insect Extract Allergy Skin Test Swelling  . Other     Optical meds unknown to pt  . Propine [Dipivefrin] Itching and Other (See Comments)    Makes eyes turn a reddish orange color.  . Travatan [Travoprost] Itching and Other (See Comments)    Makes eyes turn a reddish orange color  . Xalatan [Latanoprost] Itching and Other (See Comments)    Makes eyes turn a reddish orange color.      REVIEW OF SYSTEMS (Negative unless checked)  Constitutional: [] Weight loss  [] Fever  [] Chills Cardiac: [] Chest pain   [] Chest pressure   [] Palpitations   [] Shortness of breath when laying flat   [] Shortness of breath at rest    [x] Shortness of breath with exertion. Vascular:  [] Pain in legs with walking   [] Pain in legs at rest   [] Pain in legs when laying flat   [] Claudication   [] Pain in feet when walking  [] Pain in feet at rest  [] Pain in feet when laying flat   [] History of DVT   [] Phlebitis   [] Swelling in legs   [] Varicose veins   [] Non-healing ulcers Pulmonary:   [] Uses home oxygen   [] Productive cough   [] Hemoptysis   [] Wheeze  [] COPD   [] Asthma Neurologic:  [] Dizziness  [] Blackouts   [] Seizures   [] History of stroke   [] History of TIA  [] Aphasia   [] Temporary blindness   [] Dysphagia   [] Weakness or numbness in arms   [] Weakness or numbness in legs Musculoskeletal:  [x] Arthritis   [] Joint swelling   [x] Joint pain   [] Low back pain Hematologic:  [] Easy bruising  [] Easy bleeding   [] Hypercoagulable state   [] Anemic  [] Hepatitis Gastrointestinal:  [] Blood in stool   [] Vomiting blood  [] Gastroesophageal reflux/heartburn   [] Difficulty swallowing. Genitourinary:  [x] Chronic kidney disease   [] Difficult urination  [] Frequent urination  [] Burning with urination   [] Blood in urine Skin:  [] Rashes   [] Ulcers   [] Wounds Psychological:  [] History of anxiety   []  History of major depression.  Physical Examination  Vitals:   12/18/19 0714  Temp: 98.4 F (36.9 C)   There is no height or weight on file to calculate BMI. Gen: WD/WN, NAD Head: [place]/AT, No temporalis wasting.  Ear/Nose/Throat: Hearing grossly intact, nares w/o erythema or drainage, oropharynx w/o Erythema/Exudate,  Eyes: Conjunctiva clear, sclera non-icteric Neck: Trachea midline.  No JVD.  Pulmonary:  Good air movement, respirations not labored, no use of accessory muscles.  Cardiac: RRR, normal S1, S2. Vascular:  Vessel Right Left  Radial Palpable Palpable                                   Gastrointestinal: soft, non-tender/non-distended. No guarding/reflex. PD catheter exiting left abdomen. Musculoskeletal: M/S 5/5 throughout.  Extremities  without ischemic changes.  No deformity or atrophy.  Neurologic: Sensation grossly intact in extremities.  Symmetrical.  Speech is fluent. Motor exam as listed above. Psychiatric: Judgment intact, Mood & affect appropriate for pt's clinical situation. Dermatologic: No rashes or  ulcers noted.  No cellulitis or open wounds.      CBC Lab Results  Component Value Date   WBC 7.1 08/25/2019   HGB 7.5 (L) 12/18/2019   HCT 22.0 (L) 12/18/2019   MCV 91.7 08/25/2019   PLT 183 08/25/2019    BMET    Component Value Date/Time   NA 142 12/18/2019 0659   K 5.6 (H) 12/18/2019 0659   CL 110 12/18/2019 0659   CO2 20 (L) 08/25/2019 0933   GLUCOSE 142 (H) 12/18/2019 0659   BUN 75 (H) 12/18/2019 0659   CREATININE 11.40 (H) 12/18/2019 0659   CALCIUM 8.7 (L) 08/25/2019 0933   GFRNONAA 4 (L) 08/25/2019 0933   GFRAA 5 (L) 08/25/2019 0933   Estimated Creatinine Clearance: 4.4 mL/min (A) (by C-G formula based on SCr of 11.4 mg/dL (H)).  COAG Lab Results  Component Value Date   INR 1.0 08/25/2019   INR 1.0 04/06/2019   INR 1.0 01/03/2007    Radiology No results found.   Assessment/Plan 1. ESRD with non-functional PD catheter.  No other access.  If we do not get this working, she has no other access and she would eventually die.  She understands that.  HD not an option for her.  We will proceed and try to get the PD catheter running.  2. Hyperkalemia. Due to poor PD. Needs PD catheter revision and this is an urgent situation 3. Diabetes. Stable on outpatient medications and blood glucose control important in reducing the progression of atherosclerotic disease. Also, involved in wound healing. On appropriate medications. 4. HTN. Stable on outpatient medications although lack of dialysis worsens this.  blood pressure control important in reducing the progression of atherosclerotic disease. On appropriate oral medications.    Leotis Pain, MD  12/18/2019 7:21 AM

## 2019-12-18 NOTE — Anesthesia Preprocedure Evaluation (Addendum)
Anesthesia Evaluation  Patient identified by MRN, date of birth, ID band Patient awake    Reviewed: Allergy & Precautions, H&P , NPO status , Patient's Chart, lab work & pertinent test results  History of Anesthesia Complications Negative for: history of anesthetic complications  Airway Mallampati: III  TM Distance: >3 FB Neck ROM: limited    Dental  (+) Chipped, Poor Dentition, Missing, Partial Lower, Partial Upper   Pulmonary shortness of breath, sleep apnea and Continuous Positive Airway Pressure Ventilation , pneumonia, Patient abstained from smoking., former smoker,    Pulmonary exam normal        Cardiovascular Exercise Tolerance: Good hypertension, + CAD, + Past MI and + Cardiac Stents  Normal cardiovascular exam     Neuro/Psych Seizures - (PreE), Well Controlled,   Neuromuscular disease negative psych ROS   GI/Hepatic negative GI ROS, Neg liver ROS,   Endo/Other  diabetes, Type 2, Insulin Dependent  Renal/GU DialysisRenal disease     Musculoskeletal  (+) Arthritis ,   Abdominal   Peds  Hematology negative hematology ROS (+)   Anesthesia Other Findings Past Medical History: No date: Arthritis No date: CAD (coronary artery disease)     Comment:  Cath 2008 total RCA with collaterals No date: [facility])     Comment:  kidney 08/2011: Chronic kidney disease     Comment:  kidney cancer No date: Diabetes mellitus No date: ESRD (end stage renal disease) ([place])     Comment:  On dialysis as of 04/2019 No date: Glaucoma     Comment:  BILATERAL No date: HTN (hypertension), benign No date: Hyperlipidemia 2000, 2007: Myocardial infarction ([place]) No date: Neuromuscular disorder ([place])     Comment:  neck & bilateral hands 1990: Pneumonia 40 years ago: Seizures ([place])     Comment:  pre eclampsia No date: Shortness of breath No date: Sleep apnea     Comment:  uses cpap  Past Surgical History: No date: ABDOMINAL  HYSTERECTOMY No date: CORONARY ANGIOPLASTY     Comment:  2 STENTS 08/27/2019: INSERTION OF DIALYSIS CATHETER; N/A     Comment:  Procedure: INSERTION OF DIALYSIS CATHETER (PD CATH               INSERTION);  Surgeon: Algernon Huxley, MD;  Location: [facility]               ORS;  Service: Vascular;  Laterality: N/A; 04/06/2019: IR FLUORO GUIDE CV LINE RIGHT 08/24/2019: IR REMOVAL TUN CV CATH W/O FL 04/06/2019: IR US GUIDE VASC ACCESS RIGHT 08/27/2019: LAPAROSCOPIC LYSIS OF ADHESIONS     Comment:  Procedure: LAPAROSCOPIC LYSIS OF ADHESIONS;  Surgeon:               Algernon Huxley, MD;  Location: [facility] ORS;  Service:               Vascular;; 2000: nacrotizing faschitis; Left     Comment:  thigh into the groin and buttocks No date: NECK SURGERY 2013: ROBOTIC ASSITED PARTIAL NEPHRECTOMY; Left No date: ROTATOR CUFF REPAIR     Comment:  right arm     Reproductive/Obstetrics negative OB ROS                             Anesthesia Physical Anesthesia Plan  ASA: IV and emergent  Anesthesia Plan: General ETT   Post-op Pain Management:    Induction: Intravenous  PONV Risk Score and Plan: Ondansetron, Dexamethasone,  Midazolam and Treatment may vary due to age or medical condition  Airway Management Planned: Oral ETT  Additional Equipment:   Intra-op Plan:   Post-operative Plan: Extubation in OR  Informed Consent: I have reviewed the patients History and Physical, chart, labs and discussed the procedure including the risks, benefits and alternatives for the proposed anesthesia with the patient or authorized representative who has indicated his/her understanding and acceptance.     Dental Advisory Given  Plan Discussed with: Anesthesiologist, CRNA and Surgeon  Anesthesia Plan Comments: (Serum K 5.6.  Dr. Lucky Cowboy would like to proceed with this case emergently as patient has no other dialysis access   Patient consented for risks of anesthesia including but not limited to:   - adverse reactions to medications - damage to eyes, teeth, lips or other oral mucosa - nerve damage due to positioning  - sore throat or hoarseness - Damage to heart, brain, nerves, lungs, other parts of body or loss of life  Patient voiced understanding.)       Anesthesia Quick Evaluation

## 2019-12-18 NOTE — Anesthesia Postprocedure Evaluation (Signed)
Anesthesia Post Note  Patient: [name]  Procedure(s) Performed: LAPAROSCOPIC INSERTION CONTINUOUS AMBULATORY PERITONEAL DIALYSIS  (CAPD) CATHETER (Left ) LAPAROSCOPIC REMOVAL CONTINUOUS AMBULATORY PERITONEAL DIALYSIS  (CAPD) CATHETER (Left )  Patient location during evaluation: PACU Anesthesia Type: General Level of consciousness: awake and alert Pain management: pain level controlled Vital Signs Assessment: post-procedure vital signs reviewed and stable Respiratory status: spontaneous breathing, nonlabored ventilation, respiratory function stable and patient connected to nasal cannula oxygen Cardiovascular status: blood pressure returned to baseline and stable Postop Assessment: no apparent nausea or vomiting Anesthetic complications: no   No complications documented.   Last Vitals:  Vitals:   12/18/19 0925 12/18/19 0938  BP: (!) 146/79 (!) 144/67  Pulse: 81 85  Resp: 11 16  Temp: 36.5 C 36.6 C  SpO2: 98% 97%    Last Pain:  Vitals:   12/18/19 0938  TempSrc: Temporal  PainSc: 0-No pain                 Precious Haws Sarahelizabeth Conway

## 2019-12-18 NOTE — Op Note (Signed)
  OPERATIVE NOTE   PROCEDURE: 1. Laparoscopic lysis of adhesions  2. Removal of existing peritoneal dialysis catheter  3. New laparoscopic peritoneal dialysis catheter placement.  PRE-OPERATIVE DIAGNOSIS: 1.  End-stage renal disease 2.  Nonfunctional PD catheter  POST-OPERATIVE DIAGNOSIS: Same  SURGEON: Leotis Pain, MD  ASSISTANT(S): Hezzie Bump, PA-C  ANESTHESIA: general  ESTIMATED BLOOD LOSS: 10 cc  FINDING(S): 1. None  SPECIMEN(S): None  INDICATIONS:  Patient presents with renal failure and a nonfunctional peritoneal dialysis catheter. The patient has decided to do peritoneal dialysis for his long-term dialysis.  She does not have hemodialysis access options at this point, and if we cannot get a functional PD catheter this will be a terminal problem.  Risks and benefits of placement were discussed and she is agreeable to proceed.  Differences between peritoneal dialysis and hemodialysis were discussed.  An assistant was present during the procedure to help facilitate the exposure and expedite the procedure.   DESCRIPTION: After obtaining full informed written consent, the patient was brought back to the operating room and placed supine upon the operating table. The patient received IV antibiotics prior to induction. After obtaining adequate anesthesia, the abdomen was prepped and draped in the standard fashion.The assistant provided retraction and mobilization to help facilitate exposure and expedite the procedure throughout the entire procedure.  This included following suture, using retractors, and optimizing lighting.  I then entered the peritoneum with an 52mm Optiview trocar placed in the right upper quadrant and insufflated the abdomen with carbon dioxide.  I then placed a 5 mm trocar at direct visualization in the left upper abdomen.  Extensive adhesions encasing the existing catheter were present.  These were tediously taken down with blunt grasper with intermittent  use of the suction aspiration device.  It was clear that this existing catheter was not going to be able to be freed from the stents adhesions, but were able to create a path in the left lower abdomen down to the pelvis where a new catheter could be placed.  I then cut down over the deep cuff of the existing catheter and remove the existing catheter in its entirety.  The 5 mm trocar was exchanged for an 11 mm trocar and a new catheter was placed through this under direct visualization and parked down in the pelvis.  The deep cuff was placed just above the peritoneum where it was not visualized in the peritoneum itself but was at the level of the fascia.  A counterincision was then created several centimeters lateral to the initial exit site and the superficial cuff was parked between these 2 incisions. The appropriate distal connectors were placed, and I then placed 500 cc of saline through the catheter into the pelvis. The abdomen was desufflated. Immediately, 400 cc of effluent returned through the catheter when the bag was placed to gravity. I took one more look with the camera to ensure that the catheter was in the pelvis and it was. The 55mm trocar was then removed. I then closed the incisions with 3-0 Vicryl and 4-0 Monocryl and placed Dermabond as dressing. Dry dressing was placed around the catheter exit site. The patient was then awakened from anesthesia and taken to the recovery room in stable condition having tolerated the procedure well.  COMPLICATIONS: None  CONDITION: None  Leotis Pain, MD 12/18/2019 8:45 AM   This note was created with Dragon Medical transcription system. Any errors in dictation are purely unintentional.

## 2019-12-19 ENCOUNTER — Encounter: Payer: Self-pay | Admitting: Vascular Surgery

## 2019-12-25 ENCOUNTER — Ambulatory Visit (INDEPENDENT_AMBULATORY_CARE_PROVIDER_SITE_OTHER): Payer: Medicare Other | Admitting: Nurse Practitioner

## 2019-12-25 ENCOUNTER — Encounter (INDEPENDENT_AMBULATORY_CARE_PROVIDER_SITE_OTHER): Payer: Self-pay | Admitting: Nurse Practitioner

## 2019-12-25 ENCOUNTER — Other Ambulatory Visit: Payer: Self-pay

## 2019-12-25 VITALS — BP 159/75 | HR 85 | Resp 16 | Wt 176.6 lb

## 2019-12-25 DIAGNOSIS — I10 Essential (primary) hypertension: Secondary | ICD-10-CM

## 2019-12-25 DIAGNOSIS — E1122 Type 2 diabetes mellitus with diabetic chronic kidney disease: Secondary | ICD-10-CM

## 2019-12-25 DIAGNOSIS — Z794 Long term (current) use of insulin: Secondary | ICD-10-CM

## 2019-12-25 DIAGNOSIS — N185 Chronic kidney disease, stage 5: Secondary | ICD-10-CM

## 2019-12-25 NOTE — Progress Notes (Signed)
Subjective:    Patient ID: [name], female    DOB: 06-17-1950, 69 y.o.   MRN: [id number] Chief Complaint  Patient presents with  . Follow-up    [facility] 1 week wound check    The patient presents today following peritoneal dialysis catheter placement on 12/18/2019.  The patient previously had a peritoneal dialysis catheter that became nonfunctional.  During the timeframe before placement the patient did not wish to have a PermCath placed so she was without dialysis for some time.  The patient notes that she feels fatigued however she has been able to resume peritoneal dialysis sessions.  Most wounds are intact however in one area the patient scratched off the Dermabond and had some bleeding in the area.  Steri-Strips were applied today to the area to ensure no dehiscence.  Overall the patient has been doing well following her surgery.   Review of Systems  Constitutional: Positive for fatigue.  Respiratory: Negative for shortness of breath.   Gastrointestinal: Negative for abdominal distention and abdominal pain.  All other systems reviewed and are negative.      Objective:   Physical Exam Vitals reviewed.  HENT:     Head: Normocephalic.  Cardiovascular:     Rate and Rhythm: Normal rate and regular rhythm.     Pulses: Normal pulses.     Heart sounds: Normal heart sounds.  Pulmonary:     Effort: Pulmonary effort is normal.  Abdominal:     General: Bowel sounds are normal. There is no distension.     Palpations: Abdomen is soft.     Tenderness: There is no abdominal tenderness. There is no guarding.  Neurological:     Mental Status: She is alert and oriented to person, place, and time.     Motor: Weakness present.     Gait: Gait abnormal.  Psychiatric:        Mood and Affect: Mood normal.        Behavior: Behavior normal.        Thought Content: Thought content normal.        Judgment: Judgment normal.     BP (!) 159/75 (BP Location: Right Arm)   Pulse 85   Resp 16    Wt 176 lb 9.6 oz (80.1 kg)   BMI 34.95 kg/m   Past Medical History:  Diagnosis Date  . Arthritis   . CAD (coronary artery disease)    Cath 2008 total RCA with collaterals  . Cancer ([place])    kidney  . Chronic kidney disease 08/2011   kidney cancer  . Diabetes mellitus   . ESRD (end stage renal disease) ([place])    On dialysis as of 04/2019  . Glaucoma    BILATERAL  . HTN (hypertension), benign   . Hyperlipidemia   . Myocardial infarction ([place]) 2000, 2007  . Neuromuscular disorder ([place])    neck & bilateral hands  . Pneumonia 1990  . Seizures ([place]) 40 years ago   pre eclampsia  . Shortness of breath   . Sleep apnea    uses cpap    Social History   Socioeconomic History  . Marital status: Single    Spouse name: Not on file  . Number of children: Not on file  . Years of education: Not on file  . Highest education level: Not on file  Occupational History  . Not on file  Tobacco Use  . Smoking status: Former Smoker    Packs/day: 0.50  Years: 20.00    Pack years: 10.00    Types: Cigarettes    Quit date: 05/08/1999    Years since quitting: 20.6  . Smokeless tobacco: Former Systems developer    Types: Snuff  Vaping Use  . Vaping Use: Every day  . Substances: Nicotine  Substance and Sexual Activity  . Alcohol use: Not Currently  . Drug use: No  . Sexual activity: Not on file  Other Topics Concern  . Not on file  Social History Narrative   Single   Sedentary   Former smoker   No ETOH   Social Determinants of Radio broadcast assistant Strain:   . Difficulty of Paying Living Expenses: Not on file  Food Insecurity:   . Worried About Charity fundraiser in the Last Year: Not on file  . Ran Out of Food in the Last Year: Not on file  Transportation Needs:   . Lack of Transportation (Medical): Not on file  . Lack of Transportation (Non-Medical): Not on file  Physical Activity:   . Days of Exercise per Week: Not on file  . Minutes of Exercise per Session: Not on file    Stress:   . Feeling of Stress : Not on file  Social Connections:   . Frequency of Communication with Friends and Family: Not on file  . Frequency of Social Gatherings with Friends and Family: Not on file  . Attends Religious Services: Not on file  . Active Member of Clubs or Organizations: Not on file  . Attends Archivist Meetings: Not on file  . Marital Status: Not on file  Intimate Partner Violence:   . Fear of Current or Ex-Partner: Not on file  . Emotionally Abused: Not on file  . Physically Abused: Not on file  . Sexually Abused: Not on file    Past Surgical History:  Procedure Laterality Date  . ABDOMINAL HYSTERECTOMY    . CAPD INSERTION Left 12/18/2019   Procedure: LAPAROSCOPIC INSERTION CONTINUOUS AMBULATORY PERITONEAL DIALYSIS  (CAPD) CATHETER;  Surgeon: Algernon Huxley, MD;  Location: [facility] ORS;  Service: Vascular;  Laterality: Left;  . CAPD REMOVAL Left 12/18/2019   Procedure: LAPAROSCOPIC REMOVAL CONTINUOUS AMBULATORY PERITONEAL DIALYSIS  (CAPD) CATHETER;  Surgeon: Algernon Huxley, MD;  Location: [facility] ORS;  Service: Vascular;  Laterality: Left;  . CORONARY ANGIOPLASTY     2 STENTS  . INSERTION OF DIALYSIS CATHETER N/A 08/27/2019   Procedure: INSERTION OF DIALYSIS CATHETER (PD CATH INSERTION);  Surgeon: Algernon Huxley, MD;  Location: [facility] ORS;  Service: Vascular;  Laterality: N/A;  . IR FLUORO GUIDE CV LINE RIGHT  04/06/2019  . IR REMOVAL TUN CV CATH W/O FL  08/24/2019  . IR US GUIDE VASC ACCESS RIGHT  04/06/2019  . LAPAROSCOPIC LYSIS OF ADHESIONS  08/27/2019   Procedure: LAPAROSCOPIC LYSIS OF ADHESIONS;  Surgeon: Algernon Huxley, MD;  Location: [facility] ORS;  Service: Vascular;;  . 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 NECK SURGERY    . ROBOTIC ASSITED PARTIAL NEPHRECTOMY Left 2013  . ROTATOR CUFF REPAIR     right arm    Family History  Problem Relation Age of Onset  . Heart attack Father     Allergies  Allergen Reactions  . Insect  Extract Allergy Skin Test Swelling  . Other     Optical meds unknown to pt  . Propine [Dipivefrin] Itching and Other (See Comments)    Makes eyes  turn a reddish orange color.  . Travatan [Travoprost] Itching and Other (See Comments)    Makes eyes turn a reddish orange color  . Xalatan [Latanoprost] Itching and Other (See Comments)    Makes eyes turn a reddish orange color.        Assessment & Plan:   1. Chronic kidney disease (CKD), stage V ([place]) Currently the patient's new peritoneal dialysis catheter is working well.  Most of the wounds are healed.  At one area the patient accidentally scratched off the Dermabond and had some bleeding however no active bleeding or dehiscence seen today.  Patient will follow up with [place] on an as-needed basis.  2. 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  Hgb A1C to be monitored as already arranged by primary service   3. Essential hypertension Continue antihypertensive medications as already ordered, these medications have been reviewed and there are no changes at this time.    Current Outpatient Medications on File Prior to Visit  Medication Sig Dispense Refill  . amLODipine (NORVASC) 10 MG tablet Take 1 tablet by mouth every evening.     Marland Kitchen aspirin EC 81 MG tablet Take 81 mg by mouth daily.    . B Complex-C (B-COMPLEX WITH VITAMIN C) tablet Take 1 tablet by mouth every morning.     . Coenzyme Q10 (CO Q 10) 100 MG CAPS Take 1 tablet by mouth at bedtime.    Marland Kitchen ezetimibe-simvastatin (VYTORIN) 10-20 MG tablet Take 1 tablet by mouth at bedtime.    Marland Kitchen HYDROcodone-acetaminophen (NORCO) 5-325 MG tablet Take 1 tablet by mouth every 6 (six) hours as needed for moderate pain. 30 tablet 0  . HYDROmorphone (DILAUDID) 4 MG tablet Take 4 mg by  mouth every 4 (four) hours as needed for severe pain.    Marland Kitchen insulin glargine (LANTUS) 100 UNIT/ML injection Inject 30 Units into the skin at bedtime.     . isosorbide mononitrate (IMDUR) 30 MG 24 hr tablet Take 30 mg by mouth every morning.     . metoprolol (TOPROL-XL) 100 MG 24 hr tablet Take 100 mg by mouth every morning.     . Multiple Vitamin (MULTIVITAMIN) tablet Take 1 tablet by mouth daily.     . nitroGLYCERIN (NITROSTAT) 0.4 MG SL tablet Place 0.4 mg under the tongue every 5 (five) minutes as needed for chest pain.    Marland Kitchen oxymetazoline (AFRIN) 0.05 % nasal spray Place 2 sprays into the nose 2 (two) times daily.    . Probiotic Product (PROBIOTIC DAILY PO) Take 1 tablet by mouth daily. gummy    . ramipril (ALTACE) 10 MG capsule Take 10 mg by mouth at bedtime.     . senna (SENOKOT) 8.6 MG tablet Take 1 tablet by mouth as needed for constipation.     Marland Kitchen Spirulina 500 MG TABS Take 500 mg by mouth 3 (three) times daily.    Marland Kitchen torsemide (DEMADEX) 20 MG tablet Take by mouth.    Marland Kitchen VITAMIN D PO Take 1 tablet by mouth daily.     No current facility-administered medications on file prior to visit.    There are no Patient Instructions on file for this visit. No follow-ups on file.   Kris Hartmann, NP

## 2020-04-18 NOTE — Progress Notes (Signed)
Date:  04/25/2020   ID:  [name], DOB Mar 17, 1951, MRN [id number]  PCP:  Lucia Gaskins, MD  Cardiologist:  Jenkins Rouge, MD  Electrophysiologist:  None   Evaluation Performed:  Follow-Up Visit  History of Present Illness:    [name] is a 69 y.o. female with a past medical history significant for CAD, MI in 2000 and 2007, hyperlipidemia, hypertension, diabetes type 2, CKD, kidney cancer, anemia of chronic disease and arthritis.  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  She was hospitalized for pancreatitis in 2013.  CT showed left renal mass 3.7 cm.  She had a subsequent left nephrectomy.  Her last Myoview in 12/2016 was low risk with EF 50%.  Had  laparoscopic PD catheter, omentopexy  08/27/19 Now on dialysis  With Cr 9.13 Had repeat surgery 12/18/19 replacement of catheter   Doing well no cardiac symptoms    The patient does not have symptoms concerning for COVID-19 infection (fever, chills, cough, or new shortness of breath).    Past Medical History:  Diagnosis Date  . Arthritis   . CAD (coronary artery disease)    Cath 2008 total RCA with collaterals  . Cancer ([place])    kidney  . Chronic kidney disease 08/2011   kidney cancer  . Diabetes mellitus   . ESRD (end stage renal disease) ([place])    On dialysis as of 04/2019  . Glaucoma    BILATERAL  . HTN (hypertension), benign   . Hyperlipidemia   . Myocardial infarction ([place]) 2000, 2007  . Neuromuscular disorder ([place])    neck & bilateral hands  . Pneumonia 1990  . Seizures ([place]) 40 years ago   pre eclampsia  . Shortness of breath   . Sleep apnea    uses cpap   Past Surgical History:  Procedure Laterality Date  . ABDOMINAL HYSTERECTOMY    . CAPD INSERTION Left 12/18/2019   Procedure: LAPAROSCOPIC INSERTION CONTINUOUS AMBULATORY PERITONEAL DIALYSIS  (CAPD) CATHETER;  Surgeon: Algernon Huxley, MD;  Location: [facility]  ORS;  Service: Vascular;  Laterality: Left;  . CAPD REMOVAL Left 12/18/2019   Procedure: LAPAROSCOPIC REMOVAL CONTINUOUS AMBULATORY PERITONEAL DIALYSIS  (CAPD) CATHETER;  Surgeon: Algernon Huxley, MD;  Location: [facility] ORS;  Service: Vascular;  Laterality: Left;  . CORONARY ANGIOPLASTY     2 STENTS  . INSERTION OF DIALYSIS CATHETER N/A 08/27/2019   Procedure: INSERTION OF DIALYSIS CATHETER (PD CATH INSERTION);  Surgeon: Algernon Huxley, MD;  Location: [facility] ORS;  Service: Vascular;  Laterality: N/A;  . IR FLUORO GUIDE CV LINE RIGHT  04/06/2019  . IR REMOVAL TUN CV CATH W/O FL  08/24/2019  . IR US GUIDE VASC ACCESS RIGHT  04/06/2019  . LAPAROSCOPIC LYSIS OF ADHESIONS  08/27/2019   Procedure: LAPAROSCOPIC LYSIS OF ADHESIONS;  Surgeon: Algernon Huxley, MD;  Location: [facility] ORS;  Service: Vascular;;  . 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 NECK SURGERY    . ROBOTIC ASSITED PARTIAL NEPHRECTOMY Left 2013  . ROTATOR CUFF REPAIR     right arm     Current Meds  Medication Sig  . amLODipine (NORVASC) 10 MG tablet Take 1 tablet by mouth every evening.   Marland Kitchen aspirin EC 81 MG tablet Take 81 mg by mouth daily.  . B Complex-C (B-COMPLEX WITH VITAMIN C) tablet Take 1 tablet by  mouth every morning.   . brimonidine (ALPHAGAN) 0.2 % ophthalmic solution Place 1 drop into both eyes in the morning and at bedtime.  . cloNIDine (CATAPRES) 0.2 MG tablet Take 0.2 mg by mouth 2 (two) times daily.  . Coenzyme Q10 (CO Q 10) 100 MG CAPS Take 1 tablet by mouth at bedtime.  Marland Kitchen ezetimibe-simvastatin (VYTORIN) 10-20 MG tablet Take 1 tablet by mouth at bedtime.  . hydrALAZINE (APRESOLINE) 50 MG tablet Take 50 mg by mouth 3 (three) times daily.  Marland Kitchen HYDROcodone-acetaminophen (NORCO) 5-325 MG tablet Take 1 tablet by mouth every 6 (six) hours as needed for moderate pain.  Marland Kitchen HYDROmorphone (DILAUDID) 4 MG tablet Take 4 mg by mouth every 4 (four) hours as needed for severe pain.  Marland Kitchen insulin glargine (LANTUS) 100  UNIT/ML injection Inject 30 Units into the skin at bedtime.   . isosorbide mononitrate (IMDUR) 30 MG 24 hr tablet Take 30 mg by mouth every morning.  . metoprolol (TOPROL-XL) 100 MG 24 hr tablet Take 100 mg by mouth every morning.  . Multiple Vitamin (MULTIVITAMIN) tablet Take 1 tablet by mouth daily.  . nitroGLYCERIN (NITROSTAT) 0.4 MG SL tablet Place 0.4 mg under the tongue every 5 (five) minutes as needed for chest pain.  Marland Kitchen nystatin (MYCOSTATIN) 100000 UNIT/ML suspension Take 5 mLs by mouth as directed.  Marland Kitchen oxymetazoline (AFRIN) 0.05 % nasal spray Place 2 sprays into the nose 2 (two) times daily.  . Probiotic Product (PROBIOTIC DAILY PO) Take 1 tablet by mouth daily. gummy  . ramipril (ALTACE) 10 MG capsule Take 10 mg by mouth at bedtime.  . senna (SENOKOT) 8.6 MG tablet Take 1 tablet by mouth as needed for constipation.   Marland Kitchen Spirulina 500 MG TABS Take 500 mg by mouth 3 (three) times daily.  . timolol (TIMOPTIC) 0.5 % ophthalmic solution Place 1 drop into both eyes in the morning and at bedtime.  . torsemide (DEMADEX) 20 MG tablet Take by mouth.  Marland Kitchen VITAMIN D PO Take 1 tablet by mouth daily.     Allergies:   Insect extract allergy skin test, Other, Propine [dipivefrin], Travatan [travoprost], and Xalatan [latanoprost]   Social History   Tobacco Use  . Smoking status: Former Smoker    Packs/day: 0.50    Years: 20.00    Pack years: 10.00    Types: Cigarettes    Quit date: 05/08/1999    Years since quitting: 20.9  . Smokeless tobacco: Former Systems developer    Types: Snuff  Vaping Use  . Vaping Use: Every day  . Substances: Nicotine  Substance Use Topics  . Alcohol use: Not Currently  . Drug use: No     Family Hx: The patient's family history includes Heart attack in her father.  ROS:   Please see the history of present illness.     All other systems reviewed and are negative.   Prior CV studies:   The following studies were reviewed today:  Myocardial perfusion imaging  12/18/2016  There was no ST segment deviation noted during stress.  Defect 1: There is a medium defect of moderate severity present in the basal anteroseptal, basal inferior, basal inferolateral, mid anteroseptal, mid inferior, mid inferolateral and apical inferior location, consistent with myocardial scar.  Findings consistent with prior myocardial infarction with a minimal degree of inferoapical peri-infarct ischemia.  This is a low risk study.  Nuclear stress EF: 50%.     Labs/Other Tests and Data Reviewed:    EKG:  SR LVH ?  Old anterior MI 06/12/19   Recent Labs: 08/25/2019: Platelets 183 12/18/2019: BUN 75; Creatinine, Ser 11.40; Hemoglobin 7.5; Potassium 5.6; Sodium 142   Recent Lipid Panel Lab Results  Component Value Date/Time   CHOL 221 06/07/2009 12:00 AM   TRIG 485 06/07/2009 12:00 AM   HDL 31 06/07/2009 12:00 AM    Wt Readings from Last 3 Encounters:  04/25/20 84.9 kg  12/25/19 80.1 kg  12/15/19 81.2 kg     Objective:    Vital Signs:  BP (!) 158/68   Pulse 93   Resp 16   Ht 5' (1.524 m)   Wt 84.9 kg   SpO2 96%   BMI 36.56 kg/m    Affect appropriate Chronically ill black female  HEENT: normal Neck supple with no adenopathy JVP normal no bruits no thyromegaly Lungs clear with no wheezing and good diaphragmatic motion Heart:  S1/S2 no murmur, no rub, gallop or click PMI normal Abdomen: benighn, BS positve, no tenderness, no AAA no bruit.  No HSM or HJR PD catheter  Distal pulses intact with no bruits No edema Neuro non-focal Skin warm and dry No muscular weakness    ASSESSMENT & PLAN:     ESRD -post left nephrectomy. -Followed by Dr. [name], [facility]  - PD dialysis she does not want fistula or hemodialysis   CAD -History of chronic total occlusion of the RCA with collaterals by cath in 2008.  Low risk Myoview in 2013.  Low risk Myoview in 2018 with inferior/inferior lateral scar, no significant ischemia, EF 50%. -Patient  continues on medical therapy with aspirin 81 mg, statin, beta-blocker, long-acting nitrate, ACE inhibitor  Hypertension -On metoprolol, ramipril, Imdur, amlodipine -Blood pressure good at dialysis with no hypotension   Hyperlipidemia, LDL goal <70 -On Vytorin.  Management per PCP.  Diabetes on insulin -Followed by her PCP. -She reports being on keto diet for the last 3 years, although she does eat some bread. She reports better blood sugar control over the last year.       Medication Adjustments/Labs and Tests Ordered: Current medicines are reviewed at length with the patient today.  Concerns regarding medicines are outlined above.   Tests Ordered: No orders of the defined types were placed in this encounter.   Medication Changes: No orders of the defined types were placed in this encounter.   Follow Up:  In a year   Signed, Jenkins Rouge, MD  04/25/2020 11:13 AM    [place]

## 2020-04-25 ENCOUNTER — Ambulatory Visit (INDEPENDENT_AMBULATORY_CARE_PROVIDER_SITE_OTHER): Payer: Medicare Other | Admitting: Cardiovascular Disease

## 2020-04-25 ENCOUNTER — Encounter: Payer: Self-pay | Admitting: Cardiovascular Disease

## 2020-04-25 ENCOUNTER — Other Ambulatory Visit: Payer: Self-pay

## 2020-04-25 VITALS — BP 158/68 | HR 93 | Resp 16 | Ht 60.0 in | Wt 187.2 lb

## 2020-04-25 DIAGNOSIS — I10 Essential (primary) hypertension: Secondary | ICD-10-CM | POA: Diagnosis not present

## 2020-04-25 DIAGNOSIS — I2510 Atherosclerotic heart disease of native coronary artery without angina pectoris: Secondary | ICD-10-CM

## 2020-04-25 NOTE — Patient Instructions (Signed)
Medication Instructions:  Your physician recommends that you continue on your current medications as directed. Please refer to the Current Medication list given to you today.  *If you need a refill on your cardiac medications before your next appointment, please call your pharmacy*   Lab Work: NONE  If you have labs (blood work) drawn today and your tests are completely normal, you will receive your results only by: MyChart Message (if you have MyChart) OR A paper copy in the mail If you have any lab test that is abnormal or we need to change your treatment, we will call you to review the results.   Testing/Procedures: NONE    Follow-Up: At [facility], you and your health needs are our priority.  As part of our continuing mission to provide you with exceptional heart care, we have created designated Provider Care Teams.  These Care Teams include your primary Cardiologist (physician) and Advanced Practice Providers (APPs -  Physician Assistants and Nurse Practitioners) who all work together to provide you with the care you need, when you need it.  We recommend signing up for the patient portal called "MyChart".  Sign up information is provided on this After Visit Summary.  MyChart is used to connect with patients for Virtual Visits (Telemedicine).  Patients are able to view lab/test results, encounter notes, upcoming appointments, etc.  Non-urgent messages can be sent to your provider as well.   To learn more about what you can do with MyChart, go to [email or web address].    Your next appointment:   6 month(s)  The format for your next appointment:   In Person  Provider:   Peter Nishan, MD   Other Instructions Thank you for choosing [facility]!    

## 2020-05-25 ENCOUNTER — Emergency Department (HOSPITAL_COMMUNITY): Payer: Medicare Other

## 2020-05-25 ENCOUNTER — Encounter (HOSPITAL_COMMUNITY): Payer: Self-pay

## 2020-05-25 ENCOUNTER — Inpatient Hospital Stay (HOSPITAL_COMMUNITY)
Admission: EM | Admit: 2020-05-25 | Discharge: 2020-05-31 | DRG: 919 | Disposition: A | Discharge status: Hospice | Payer: Medicare Other | Attending: Internal Medicine | Admitting: Internal Medicine

## 2020-05-25 ENCOUNTER — Other Ambulatory Visit: Payer: Self-pay

## 2020-05-25 DIAGNOSIS — E782 Mixed hyperlipidemia: Secondary | ICD-10-CM | POA: Diagnosis present

## 2020-05-25 DIAGNOSIS — Z794 Long term (current) use of insulin: Secondary | ICD-10-CM

## 2020-05-25 DIAGNOSIS — I2510 Atherosclerotic heart disease of native coronary artery without angina pectoris: Secondary | ICD-10-CM | POA: Diagnosis present

## 2020-05-25 DIAGNOSIS — Z905 Acquired absence of kidney: Secondary | ICD-10-CM

## 2020-05-25 DIAGNOSIS — Z90710 Acquired absence of both cervix and uterus: Secondary | ICD-10-CM | POA: Diagnosis not present

## 2020-05-25 DIAGNOSIS — Z7982 Long term (current) use of aspirin: Secondary | ICD-10-CM | POA: Diagnosis not present

## 2020-05-25 DIAGNOSIS — Z20822 Contact with and (suspected) exposure to covid-19: Secondary | ICD-10-CM | POA: Diagnosis present

## 2020-05-25 DIAGNOSIS — Z66 Do not resuscitate: Secondary | ICD-10-CM | POA: Diagnosis not present

## 2020-05-25 DIAGNOSIS — Y832 Surgical operation with anastomosis, bypass or graft as the cause of abnormal reaction of the patient, or of later complication, without mention of misadventure at the time of the procedure: Secondary | ICD-10-CM | POA: Diagnosis present

## 2020-05-25 DIAGNOSIS — G8929 Other chronic pain: Secondary | ICD-10-CM | POA: Diagnosis present

## 2020-05-25 DIAGNOSIS — N186 End stage renal disease: Secondary | ICD-10-CM | POA: Diagnosis present

## 2020-05-25 DIAGNOSIS — E1122 Type 2 diabetes mellitus with diabetic chronic kidney disease: Secondary | ICD-10-CM | POA: Diagnosis present

## 2020-05-25 DIAGNOSIS — I120 Hypertensive chronic kidney disease with stage 5 chronic kidney disease or end stage renal disease: Secondary | ICD-10-CM | POA: Diagnosis present

## 2020-05-25 DIAGNOSIS — T8571XA Infection and inflammatory reaction due to peritoneal dialysis catheter, initial encounter: Principal | ICD-10-CM | POA: Diagnosis present

## 2020-05-25 DIAGNOSIS — Z85528 Personal history of other malignant neoplasm of kidney: Secondary | ICD-10-CM

## 2020-05-25 DIAGNOSIS — T888XXA Other specified complications of surgical and medical care, not elsewhere classified, initial encounter: Secondary | ICD-10-CM

## 2020-05-25 DIAGNOSIS — I252 Old myocardial infarction: Secondary | ICD-10-CM | POA: Diagnosis not present

## 2020-05-25 DIAGNOSIS — Z87891 Personal history of nicotine dependence: Secondary | ICD-10-CM

## 2020-05-25 DIAGNOSIS — E669 Obesity, unspecified: Secondary | ICD-10-CM | POA: Diagnosis present

## 2020-05-25 DIAGNOSIS — Z955 Presence of coronary angioplasty implant and graft: Secondary | ICD-10-CM | POA: Diagnosis not present

## 2020-05-25 DIAGNOSIS — E119 Type 2 diabetes mellitus without complications: Secondary | ICD-10-CM

## 2020-05-25 DIAGNOSIS — Z79899 Other long term (current) drug therapy: Secondary | ICD-10-CM | POA: Diagnosis not present

## 2020-05-25 DIAGNOSIS — Z6836 Body mass index (BMI) 36.0-36.9, adult: Secondary | ICD-10-CM

## 2020-05-25 DIAGNOSIS — I10 Essential (primary) hypertension: Secondary | ICD-10-CM | POA: Diagnosis present

## 2020-05-25 DIAGNOSIS — N185 Chronic kidney disease, stage 5: Secondary | ICD-10-CM

## 2020-05-25 DIAGNOSIS — Z992 Dependence on renal dialysis: Secondary | ICD-10-CM | POA: Diagnosis not present

## 2020-05-25 DIAGNOSIS — E1165 Type 2 diabetes mellitus with hyperglycemia: Secondary | ICD-10-CM | POA: Diagnosis present

## 2020-05-25 DIAGNOSIS — K659 Peritonitis, unspecified: Secondary | ICD-10-CM | POA: Diagnosis present

## 2020-05-25 DIAGNOSIS — D631 Anemia in chronic kidney disease: Secondary | ICD-10-CM | POA: Diagnosis present

## 2020-05-25 DIAGNOSIS — Z515 Encounter for palliative care: Secondary | ICD-10-CM

## 2020-05-25 DIAGNOSIS — Z7189 Other specified counseling: Secondary | ICD-10-CM | POA: Diagnosis not present

## 2020-05-25 DIAGNOSIS — R5381 Other malaise: Secondary | ICD-10-CM | POA: Diagnosis present

## 2020-05-25 DIAGNOSIS — E11649 Type 2 diabetes mellitus with hypoglycemia without coma: Secondary | ICD-10-CM | POA: Diagnosis not present

## 2020-05-25 DIAGNOSIS — E8770 Fluid overload, unspecified: Secondary | ICD-10-CM | POA: Diagnosis present

## 2020-05-25 DIAGNOSIS — E1142 Type 2 diabetes mellitus with diabetic polyneuropathy: Secondary | ICD-10-CM | POA: Diagnosis present

## 2020-05-25 LAB — CBC WITH DIFFERENTIAL/PLATELET
Abs Immature Granulocytes: 0.09 10*3/uL — ABNORMAL HIGH (ref 0.00–0.07)
Basophils Absolute: 0 10*3/uL (ref 0.0–0.1)
Basophils Relative: 0 %
Eosinophils Absolute: 0 10*3/uL (ref 0.0–0.5)
Eosinophils Relative: 0 %
HCT: 28.7 % — ABNORMAL LOW (ref 36.0–46.0)
Hemoglobin: 8.9 g/dL — ABNORMAL LOW (ref 12.0–15.0)
Immature Granulocytes: 1 %
Lymphocytes Relative: 15 %
Lymphs Abs: 2.2 10*3/uL (ref 0.7–4.0)
MCH: 30.2 pg (ref 26.0–34.0)
MCHC: 31 g/dL (ref 30.0–36.0)
MCV: 97.3 fL (ref 80.0–100.0)
Monocytes Absolute: 0.4 10*3/uL (ref 0.1–1.0)
Monocytes Relative: 3 %
Neutro Abs: 11.9 10*3/uL — ABNORMAL HIGH (ref 1.7–7.7)
Neutrophils Relative %: 81 %
Platelets: 427 10*3/uL — ABNORMAL HIGH (ref 150–400)
RBC: 2.95 MIL/uL — ABNORMAL LOW (ref 3.87–5.11)
RDW: 15.1 % (ref 11.5–15.5)
WBC: 14.7 10*3/uL — ABNORMAL HIGH (ref 4.0–10.5)
nRBC: 0 % (ref 0.0–0.2)

## 2020-05-25 LAB — COMPREHENSIVE METABOLIC PANEL
ALT: 17 U/L (ref 0–44)
AST: 19 U/L (ref 15–41)
Albumin: 1.8 g/dL — ABNORMAL LOW (ref 3.5–5.0)
Alkaline Phosphatase: 46 U/L (ref 38–126)
Anion gap: 11 (ref 5–15)
BUN: 65 mg/dL — ABNORMAL HIGH (ref 8–23)
CO2: 29 mmol/L (ref 22–32)
Calcium: 8.2 mg/dL — ABNORMAL LOW (ref 8.9–10.3)
Chloride: 97 mmol/L — ABNORMAL LOW (ref 98–111)
Creatinine, Ser: 9.28 mg/dL — ABNORMAL HIGH (ref 0.44–1.00)
GFR, Estimated: 4 mL/min — ABNORMAL LOW (ref >60)
Glucose, Bld: 63 mg/dL — ABNORMAL LOW (ref 70–99)
Potassium: 3.7 mmol/L (ref 3.5–5.1)
Sodium: 137 mmol/L (ref 135–145)
Total Bilirubin: 0.2 mg/dL — ABNORMAL LOW (ref 0.3–1.2)
Total Protein: 5.7 g/dL — ABNORMAL LOW (ref 6.5–8.1)

## 2020-05-25 LAB — RESP PANEL BY RT-PCR (FLU A&B, COVID) ARPGX2
Influenza A by PCR: NEGATIVE
Influenza B by PCR: NEGATIVE
SARS Coronavirus 2 by RT PCR: NEGATIVE

## 2020-05-25 LAB — LIPASE, BLOOD: Lipase: 18 U/L (ref 11–51)

## 2020-05-25 MED ORDER — ISOSORBIDE MONONITRATE ER 30 MG PO TB24
30.0000 mg | ORAL_TABLET | Freq: Every day | ORAL | Status: DC
  Administered 2020-05-26 – 2020-05-28 (×3): 30 mg via ORAL
  Filled 2020-05-25 (×3): qty 1

## 2020-05-25 MED ORDER — HYDROMORPHONE HCL 2 MG PO TABS
4.0000 mg | ORAL_TABLET | ORAL | Status: DC | PRN
  Administered 2020-05-26: 2 mg via ORAL
  Filled 2020-05-25: qty 2

## 2020-05-25 MED ORDER — TORSEMIDE 20 MG PO TABS
20.0000 mg | ORAL_TABLET | Freq: Every day | ORAL | Status: DC
Start: 2020-05-26 — End: 2020-06-01
  Administered 2020-05-26 – 2020-05-31 (×6): 20 mg via ORAL
  Filled 2020-05-25 (×9): qty 1

## 2020-05-25 MED ORDER — ASPIRIN EC 81 MG PO TBEC
81.0000 mg | DELAYED_RELEASE_TABLET | Freq: Every day | ORAL | Status: DC
  Administered 2020-05-26 – 2020-05-28 (×3): 81 mg via ORAL
  Filled 2020-05-25 (×3): qty 1

## 2020-05-25 MED ORDER — EZETIMIBE-SIMVASTATIN 10-20 MG PO TABS: 1.0000 | ORAL_TABLET | Freq: Every day | ORAL | Status: DC

## 2020-05-25 MED ORDER — SIMVASTATIN 20 MG PO TABS
20.0000 mg | ORAL_TABLET | Freq: Every day | ORAL | Status: DC
  Administered 2020-05-25 – 2020-05-27 (×3): 20 mg via ORAL
  Filled 2020-05-25 (×3): qty 1

## 2020-05-25 MED ORDER — METOPROLOL SUCCINATE ER 50 MG PO TB24
100.0000 mg | ORAL_TABLET | Freq: Every day | ORAL | Status: DC
  Administered 2020-05-26 – 2020-05-28 (×3): 100 mg via ORAL
  Filled 2020-05-25 (×3): qty 2

## 2020-05-25 MED ORDER — TIMOLOL MALEATE 0.5 % OP SOLN
1.0000 [drp] | Freq: Every day | OPHTHALMIC | Status: DC
  Administered 2020-05-26 – 2020-05-28 (×3): 1 [drp] via OPHTHALMIC
  Filled 2020-05-25: qty 5

## 2020-05-25 MED ORDER — ACETAMINOPHEN 325 MG PO TABS
650.0000 mg | ORAL_TABLET | Freq: Once | ORAL | Status: AC
  Administered 2020-05-25: 650 mg via ORAL
  Filled 2020-05-25: qty 2

## 2020-05-25 MED ORDER — ACETAMINOPHEN 650 MG RE SUPP: 650.0000 mg | Freq: Four times a day (QID) | RECTAL | Status: DC | PRN

## 2020-05-25 MED ORDER — ACETAMINOPHEN 325 MG PO TABS: 650.0000 mg | ORAL_TABLET | Freq: Four times a day (QID) | ORAL | Status: DC | PRN

## 2020-05-25 MED ORDER — VANCOMYCIN HCL IN DEXTROSE 1-5 GM/200ML-% IV SOLN
1000.0000 mg | Freq: Once | INTRAVENOUS | Status: AC
  Administered 2020-05-25: 1000 mg via INTRAVENOUS
  Filled 2020-05-25: qty 200

## 2020-05-25 MED ORDER — SODIUM CHLORIDE 0.9 % IV SOLN
2.0000 g | Freq: Once | INTRAVENOUS | Status: AC
  Administered 2020-05-25: 2 g via INTRAVENOUS
  Filled 2020-05-25: qty 2

## 2020-05-25 MED ORDER — VANCOMYCIN HCL 750 MG/150ML IV SOLN
750.0000 mg | INTRAVENOUS | Status: DC
  Filled 2020-05-25: qty 150

## 2020-05-25 MED ORDER — AMLODIPINE BESYLATE 10 MG PO TABS
10.0000 mg | ORAL_TABLET | Freq: Every evening | ORAL | Status: DC
Start: 2020-05-25 — End: 2020-05-28
  Administered 2020-05-25 – 2020-05-26 (×2): 10 mg via ORAL
  Filled 2020-05-25: qty 2
  Filled 2020-05-25 (×2): qty 1

## 2020-05-25 MED ORDER — BRIMONIDINE TARTRATE 0.2 % OP SOLN
1.0000 [drp] | Freq: Three times a day (TID) | OPHTHALMIC | Status: DC
  Administered 2020-05-26 – 2020-05-28 (×6): 1 [drp] via OPHTHALMIC
  Filled 2020-05-25: qty 5

## 2020-05-25 MED ORDER — VANCOMYCIN VARIABLE DOSE PER UNSTABLE RENAL FUNCTION (PHARMACIST DOSING): Status: DC

## 2020-05-25 MED ORDER — CLONIDINE HCL 0.2 MG PO TABS
0.2000 mg | ORAL_TABLET | Freq: Two times a day (BID) | ORAL | Status: DC
  Administered 2020-05-25 – 2020-05-28 (×6): 0.2 mg via ORAL
  Filled 2020-05-25 (×6): qty 1

## 2020-05-25 MED ORDER — SODIUM CHLORIDE 0.9 % IV SOLN
1.0000 g | INTRAVENOUS | Status: DC
  Filled 2020-05-25: qty 1

## 2020-05-25 MED ORDER — SENNA 8.6 MG PO TABS
1.0000 | ORAL_TABLET | Freq: Every day | ORAL | Status: DC | PRN
  Administered 2020-05-26 – 2020-05-31 (×5): 8.6 mg via ORAL
  Filled 2020-05-25 (×5): qty 1

## 2020-05-25 MED ORDER — NITROGLYCERIN 0.4 MG SL SUBL: 0.4000 mg | SUBLINGUAL_TABLET | SUBLINGUAL | Status: DC | PRN

## 2020-05-25 MED ORDER — HYDRALAZINE HCL 50 MG PO TABS
50.0000 mg | ORAL_TABLET | Freq: Three times a day (TID) | ORAL | Status: DC
  Administered 2020-05-25 – 2020-05-28 (×7): 50 mg via ORAL
  Filled 2020-05-25 (×8): qty 1

## 2020-05-25 MED ORDER — INSULIN ASPART 100 UNIT/ML ~~LOC~~ SOLN
0.0000 [IU] | Freq: Three times a day (TID) | SUBCUTANEOUS | Status: DC
  Administered 2020-05-26 (×2): 1 [IU] via SUBCUTANEOUS

## 2020-05-25 MED ORDER — EZETIMIBE 10 MG PO TABS
10.0000 mg | ORAL_TABLET | Freq: Every day | ORAL | Status: DC
  Administered 2020-05-26 – 2020-05-27 (×2): 10 mg via ORAL
  Filled 2020-05-25 (×3): qty 1

## 2020-05-25 MED ORDER — FENTANYL CITRATE (PF) 100 MCG/2ML IJ SOLN
50.0000 ug | Freq: Once | INTRAMUSCULAR | Status: AC
  Administered 2020-05-25: 50 ug via INTRAVENOUS
  Filled 2020-05-25: qty 2

## 2020-05-25 MED ORDER — INSULIN GLARGINE 100 UNIT/ML ~~LOC~~ SOLN
30.0000 [IU] | Freq: Every day | SUBCUTANEOUS | Status: DC
  Administered 2020-05-26: 30 [IU] via SUBCUTANEOUS
  Filled 2020-05-25 (×2): qty 0.3

## 2020-05-25 NOTE — ED Triage Notes (Signed)
Per carelink, pt coming from [facility] ed, brought initially for peritoneal dialysis blood in tubing. Pt has abd pain and fever, wbc 17, hgb 8.9,Temp 143f, [place] gave 650mg  tylenol, 100mcg fentanyl, 20g in right AC.  HX: esrd, kidney cancer, copd, dm, mi, and htn. 148/93, 98 room air. Pitting edema in legs noted bilaterally.   transfered for peritoneal tubing sampling.

## 2020-05-25 NOTE — H&P (Addendum)
History and Physical    [name] PPI:[id number] DOB: 02-09-1951 DOA: 05/25/2020  PCP: Lucia Gaskins, MD  Patient coming from: Home.  Chief Complaint: Abdominal pain and pink-colored dialysate fluid.  HPI: [name] is a 70 y.o. female with history of ESRD on peritoneal dialysis has been experiencing return fluid from the peritoneal catheter to be mildly bloody with increasing abdominal pain mostly in the lower quadrants over the last 24 hours.  Has subjective feeling of chills.  Denies any nausea vomiting or diarrhea.  ED Course: Patient had initially presented to the ER at [facility] and CT abdomen pelvis shows features concerning for possible hematoma versus abscess in the deep portion of the peritoneal dialysis catheter.  Nephrology was consulted requested getting cultures and starting antibiotics and patient was transferred to [facility] for further management.  COVID test was negative.  Labs are significant for WBC count of 14.7 hemoglobin of 8.9 potassium was 3.7.   Review of Systems: As per HPI, rest all negative.   Past Medical History:  Diagnosis Date  . Arthritis   . CAD (coronary artery disease)    Cath 2008 total RCA with collaterals  . Cancer ([place])    kidney  . Chronic kidney disease 08/2011   kidney cancer  . Diabetes mellitus   . ESRD (end stage renal disease) ([place])    On dialysis as of 04/2019  . Glaucoma    BILATERAL  . HTN (hypertension), benign   . Hyperlipidemia   . Myocardial infarction ([place]) 2000, 2007  . Neuromuscular disorder ([place])    neck & bilateral hands  . Pneumonia 1990  . Seizures ([place]) 40 years ago   pre eclampsia  . Shortness of breath   . Sleep apnea    uses cpap    Past Surgical History:  Procedure Laterality Date  . ABDOMINAL HYSTERECTOMY    . CAPD INSERTION Left 12/18/2019   Procedure: LAPAROSCOPIC INSERTION CONTINUOUS AMBULATORY PERITONEAL DIALYSIS  (CAPD) CATHETER;  Surgeon: Algernon Huxley, MD;  Location: [facility] ORS;   Service: Vascular;  Laterality: Left;  . CAPD REMOVAL Left 12/18/2019   Procedure: LAPAROSCOPIC REMOVAL CONTINUOUS AMBULATORY PERITONEAL DIALYSIS  (CAPD) CATHETER;  Surgeon: Algernon Huxley, MD;  Location: [facility] ORS;  Service: Vascular;  Laterality: Left;  . CORONARY ANGIOPLASTY     2 STENTS  . INSERTION OF DIALYSIS CATHETER N/A 08/27/2019   Procedure: INSERTION OF DIALYSIS CATHETER (PD CATH INSERTION);  Surgeon: Algernon Huxley, MD;  Location: [facility] ORS;  Service: Vascular;  Laterality: N/A;  . IR FLUORO GUIDE CV LINE RIGHT  04/06/2019  . IR REMOVAL TUN CV CATH W/O FL  08/24/2019  . IR US GUIDE VASC ACCESS RIGHT  04/06/2019  . LAPAROSCOPIC LYSIS OF ADHESIONS  08/27/2019   Procedure: LAPAROSCOPIC LYSIS OF ADHESIONS;  Surgeon: Algernon Huxley, MD;  Location: [facility] ORS;  Service: Vascular;;  . 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 NECK SURGERY    . ROBOTIC ASSITED PARTIAL NEPHRECTOMY Left 2013  . ROTATOR CUFF REPAIR     right arm     reports that she quit smoking about 21 years ago. Her smoking use included cigarettes. She has a 10.00 pack-year smoking history. She has quit using smokeless tobacco.  Her smokeless tobacco use included snuff. She reports previous alcohol use. She reports that she does not use drugs.  Allergies  Allergen Reactions  . Insect Extract Allergy Skin Test Swelling  .  Other     Optical meds unknown to pt  . Propine [Dipivefrin] Itching and Other (See Comments)    Makes eyes turn a reddish orange color.  . Travatan [Travoprost] Itching and Other (See Comments)    Makes eyes turn a reddish orange color  . Xalatan [Latanoprost] Itching and Other (See Comments)    Makes eyes turn a reddish orange color.     Family History  Problem Relation Age of Onset  . Heart attack Father     Prior to Admission medications   Medication Sig Start Date End Date Taking? Authorizing Provider  amLODipine (NORVASC) 10 MG tablet Take 1 tablet by mouth every  evening.    Yes [provider]  amLODipine (NORVASC) 5 MG tablet Take 5 mg by mouth daily. 04/21/20  Yes [provider]  Aromatic Inhalants (DECONGESTANT INHALER IN) Inhale 2 puffs into the lungs daily.   Yes [provider]  aspirin EC 81 MG tablet Take 81 mg by mouth daily.   Yes [provider]  B Complex-C (B-COMPLEX WITH VITAMIN C) tablet Take 1 tablet by mouth every morning.    Yes [provider]  brimonidine (ALPHAGAN) 0.2 % ophthalmic solution Place 1 drop into both eyes in the morning and at bedtime. 03/21/20  Yes [provider]  cloNIDine (CATAPRES) 0.2 MG tablet Take 0.2 mg by mouth 2 (two) times daily. 03/15/20  Yes [provider]  Coenzyme Q10 (CO Q 10) 100 MG CAPS Take 1 tablet by mouth at bedtime.   Yes [provider]  ezetimibe-simvastatin (VYTORIN) 10-20 MG tablet Take 1 tablet by mouth at bedtime. 02/24/19  Yes [provider]  hydrALAZINE (APRESOLINE) 50 MG tablet Take 50 mg by mouth 3 (three) times daily. 02/23/20  Yes [provider]  HYDROmorphone (DILAUDID) 4 MG tablet Take 4 mg by mouth every 4 (four) hours as needed for severe pain.   Yes [provider]  insulin glargine (LANTUS) 100 UNIT/ML injection Inject 30 Units into the skin at bedtime.    Yes [provider]  isosorbide mononitrate (IMDUR) 30 MG 24 hr tablet Take 30 mg by mouth every morning.   Yes [provider]  metoprolol (TOPROL-XL) 100 MG 24 hr tablet Take 100 mg by mouth every morning.   Yes [provider]  Multiple Vitamin (MULTIVITAMIN) tablet Take 1 tablet by mouth daily.   Yes [provider]  nitroGLYCERIN (NITROSTAT) 0.4 MG SL tablet Place 0.4 mg under the tongue every 5 (five) minutes as needed for chest pain. 07/18/11  Yes Josue Hector, MD  senna (SENOKOT) 8.6 MG tablet Take 1 tablet by mouth as needed for constipation.    Yes [provider]  Spirulina  500 MG TABS Take 500 mg by mouth 3 (three) times daily.   Yes [provider]  timolol (TIMOPTIC) 0.5 % ophthalmic solution Place 1 drop into both eyes in the morning and at bedtime. 03/20/20  Yes [provider]  torsemide (DEMADEX) 20 MG tablet Take 20 mg by mouth daily. 08/20/19 08/19/20 Yes [provider]  VITAMIN D PO Take 1 tablet by mouth daily.   Yes [provider]  HYDROcodone-acetaminophen (NORCO) 5-325 MG tablet Take 1 tablet by mouth every 6 (six) hours as needed for moderate pain. Patient not taking: No sig reported 12/18/19   Algernon Huxley, MD  nystatin (MYCOSTATIN) 100000 UNIT/ML suspension Take 5 mLs by mouth as directed. Patient not taking: No sig reported  04/06/20   [provider]  Probiotic Product (PROBIOTIC DAILY PO) Take 1 tablet by mouth daily. gummy    [provider]  ramipril (ALTACE) 10 MG capsule Take 10 mg by mouth at bedtime. Patient not taking: Reported on 05/25/2020    [provider]    Physical Exam: Constitutional: Moderately built and nourished. Vitals:   05/25/20 1439 05/25/20 2048 05/25/20 2304  BP: (!) 165/63 (!) 169/72 (!) 178/74  Pulse: 92 92 100  Resp: 20 20 13   Temp: 100 F (37.8 C) 100 F (37.8 C) 99 F (37.2 C)  TempSrc: Oral Oral   SpO2: 95% 93% 92%   Eyes: Anicteric no pallor. ENMT: No discharge from the ears eyes nose and mouth. Neck: No mass felt.  No neck rigidity. Respiratory: No rhonchi or crepitations. Cardiovascular: S1-S2 heard. Abdomen: Soft mild diffuse tenderness no guarding or rigidity. Musculoskeletal: No edema. Skin: No rash. Neurologic: Alert awake oriented to time place and person.  Moves all extremities. Psychiatric: Appears normal.  Normal affect.   Labs on Admission: I have personally reviewed following labs and imaging studies  CBC: Recent Labs  Lab 05/25/20 1835  WBC 14.7*  NEUTROABS 11.9*  HGB 8.9*  HCT 28.7*  MCV 97.3  PLT 427*   Basic  Metabolic Panel: Recent Labs  Lab 05/25/20 1835  NA 137  K 3.7  CL 97*  CO2 29  GLUCOSE 63*  BUN 65*  CREATININE 9.28*  CALCIUM 8.2*   GFR: CrCl cannot be calculated (Unknown ideal weight.). Liver Function Tests: Recent Labs  Lab 05/25/20 1835  AST 19  ALT 17  ALKPHOS 46  BILITOT 0.2*  PROT 5.7*  ALBUMIN 1.8*   Recent Labs  Lab 05/25/20 1835  LIPASE 18   No results for input(s): AMMONIA in the last 168 hours. Coagulation Profile: No results for input(s): INR, PROTIME in the last 168 hours. Cardiac Enzymes: No results for input(s): CKTOTAL, CKMB, CKMBINDEX, TROPONINI in the last 168 hours. BNP (last 3 results) No results for input(s): PROBNP in the last 8760 hours. HbA1C: No results for input(s): HGBA1C in the last 72 hours. CBG: No results for input(s): GLUCAP in the last 168 hours. Lipid Profile: No results for input(s): CHOL, HDL, LDLCALC, TRIG, CHOLHDL, LDLDIRECT in the last 72 hours. Thyroid Function Tests: No results for input(s): TSH, T4TOTAL, FREET4, T3FREE, THYROIDAB in the last 72 hours. Anemia Panel: No results for input(s): VITAMINB12, FOLATE, FERRITIN, TIBC, IRON, RETICCTPCT in the last 72 hours. Urine analysis:    Component Value Date/Time   COLORURINE YELLOW 01/03/2007 1041   APPEARANCEUR CLEAR 01/03/2007 1041   LABSPEC 1.015 01/03/2007 1041   PHURINE 6.5 01/03/2007 1041   GLUCOSEU NEGATIVE 01/03/2007 [place] 01/03/2007 [place] 01/03/2007 [place] 01/03/2007 1041   PROTEINUR NEGATIVE 01/03/2007 1041   UROBILINOGEN 0.2 01/03/2007 1041   NITRITE NEGATIVE 01/03/2007 1041   LEUKOCYTESUR  01/03/2007 1041    NEGATIVE MICROSCOPIC NOT DONE ON URINES WITH NEGATIVE PROTEIN, BLOOD, LEUKOCYTES, NITRITE, OR GLUCOSE <1000 mg/dL.   Sepsis Labs: @LABRCNTIP (procalcitonin:4,lacticidven:4) ) Recent Results (from the past 240 hour(s))  Resp Panel by RT-PCR (Flu A&B, Covid) Nasopharyngeal Swab     Status:  None   Collection Time: 05/25/20  4:00 PM   Specimen: Nasopharyngeal Swab; Nasopharyngeal(NP) swabs in vial transport medium  Result Value Ref Range Status   SARS Coronavirus 2 by RT PCR NEGATIVE NEGATIVE Final    Comment: (NOTE) SARS-CoV-2 target  nucleic acids are NOT DETECTED.  The SARS-CoV-2 RNA is generally detectable in upper respiratory specimens during the acute phase of infection. The lowest concentration of SARS-CoV-2 viral copies this assay can detect is 138 copies/mL. A negative result does not preclude SARS-Cov-2 infection and should not be used as the sole basis for treatment or other patient management decisions. A negative result may occur with  improper specimen collection/handling, submission of specimen other than nasopharyngeal swab, presence of viral mutation(s) within the areas targeted by this assay, and inadequate number of viral copies(<138 copies/mL). A negative result must be combined with clinical observations, patient history, and epidemiological information. The expected result is Negative.  Fact Sheet for Patients:  EntrepreneurPulse.com.au  Fact Sheet for Healthcare Providers:  IncredibleEmployment.be  This test is no t yet approved or cleared by the [place] FDA and  has been authorized for detection and/or diagnosis of SARS-CoV-2 by FDA under an Emergency Use Authorization (EUA). This EUA will remain  in effect (meaning this test can be used) for the duration of the COVID-19 declaration under Section 564(b)(1) of the Act, 21 U.S.C.section 360bbb-3(b)(1), unless the authorization is terminated  or revoked sooner.       Influenza A by PCR NEGATIVE NEGATIVE Final   Influenza B by PCR NEGATIVE NEGATIVE Final    Comment: (NOTE) The Xpert Xpress SARS-CoV-2/FLU/RSV plus assay is intended as an aid in the diagnosis of influenza from Nasopharyngeal swab specimens and should not be used as a sole basis for  treatment. Nasal washings and aspirates are unacceptable for Xpert Xpress SARS-CoV-2/FLU/RSV testing.  Fact Sheet for Patients: EntrepreneurPulse.com.au  Fact Sheet for Healthcare Providers: IncredibleEmployment.be  This test is not yet approved or cleared by the [place] FDA and has been authorized for detection and/or diagnosis of SARS-CoV-2 by FDA under an Emergency Use Authorization (EUA). This EUA will remain in effect (meaning this test can be used) for the duration of the COVID-19 declaration under Section 564(b)(1) of the Act, 21 U.S.C. section 360bbb-3(b)(1), unless the authorization is terminated or revoked.  Performed at [facility], [address], [place], [place] [address]      Radiological Exams on Admission: CT ABDOMEN PELVIS WO CONTRAST  Result Date: 05/25/2020 CLINICAL DATA:  Bleeding during peritoneal hemodialysis. Lower abdominal pain. EXAM: CT ABDOMEN AND PELVIS WITHOUT CONTRAST TECHNIQUE: Multidetector CT imaging of the abdomen and pelvis was performed following the standard protocol without IV contrast. COMPARISON:  09/14/2016 FINDINGS: Lower chest: Circumflex and right coronary artery atherosclerotic calcification mild cardiomegaly. C bandlike densities in the lingula, lower lobes, and right middle lobe some of which likely represents scarring but a component may reflect atelectasis. Hepatobiliary: Small layering gallstones in the gallbladder is shown on image 28 series 2. Mildly prominent gallbladder caliber. Pancreas: Unremarkable Spleen: Unremarkable Adrenals/Urinary Tract: 1.1 by 1.5 cm nonspecific nodule of the medial limb of the left adrenal gland, unchanged from 2018 hence likely benign. Various renal lesions are present, some of which are complex bilaterally a left kidney lower pole exophytic mildly hyperdense lesion measures 2.6 cm transverse, previously 1.8 cm transverse. Strictly speaking, renal masses are not  excluded. No nephrolithiasis identified. Stomach/Bowel: Mild wall thickening in the ascending colon, potentially partially from nondistention and fatty deposition in the colon wall, although a component of colitis is difficult to exclude. Appendix unremarkable. No dilated small bowel identified. Vascular/Lymphatic: Aortoiliac atherosclerotic vascular disease. No pathologic adenopathy identified. Reproductive: Uterus absent. Adnexa unremarkable. Other: Notable subcutaneous and flank edema. Small amount of ascites in  the left lower quadrant around the peritoneal dialysis catheter looped. Ascites has a density of about 13 Hounsfield units. Mild mesenteric and omental edema along with mild presacral edema. Along the deep subcutaneous portion of the peritoneal dialysis catheter, there is an indistinctly marginated 2.7 by 3.4 by 3.1 cm collection of fluid which could be abscess or hematoma, as shown on image 36 of series 5. Surrounding mildly confluent inflammatory stranding in this vicinity. Musculoskeletal: Thoracic and lumbar spondylosis. IMPRESSION: 1. Along the deep subcutaneous portion of the peritoneal dialysis catheter, there is an indistinctly marginated 2.7 by 3.4 by 3.1 cm collection of fluid which could be abscess or hematoma. Surrounding mildly confluent inflammatory stranding in this vicinity. 2. Small amount of ascites in the left lower quadrant around the peritoneal dialysis catheter looped. 3. Mild wall thickening in the ascending colon, potentially partially from nondistention and fatty deposition in the colon wall, although a component of colitis is difficult to exclude. 4. Various renal lesions are present, some of which are complex bilaterally. Strictly speaking, renal masses are not excluded. 5. Other imaging findings of potential clinical significance: Coronary atherosclerosis with mild cardiomegaly. Cholelithiasis. Stable nonspecific 1.1 by 1.5 cm nodule of the medial limb of the left adrenal  gland, unchanged from 2018 hence likely benign. C bandlike densities in the lingula, lower lobes, and right middle lobe some of which likely represents scarring but a component may reflect atelectasis. 6. Aortic atherosclerosis. Aortic Atherosclerosis (ICD10-I70.0). Electronically Signed   By: Van Clines M.D.   On: 05/25/2020 16:43      Assessment/Plan Principal Problem:   Infection associated with peritoneal dialysis catheter [facility]) Active Problems:   Mixed hyperlipidemia   Essential hypertension   Coronary atherosclerosis   DM (diabetes mellitus) ([place])    1. Infection associated with peritoneal dialysis catheter for which patient is started on empiric antibiotics follow cultures we will await further recommendations per nephrology. 2. Diabetes mellitus type 2 on Lantus insulin 30 units at bedtime.  Follow CBGs closely. 3. Hypertension on beta-blockers hydralazine clonidine and amlodipine. 4. Anemia likely from renal disease follow CBC.  Patient states that the bloody discharge coming from the dialysis cath.  Closely monitor. 5. Chronic pain on Dilaudid.   DVT prophylaxis: Now in anticipation of possible procedure. Code Status: Full code. Family Communication: Discussed with patient. Disposition Plan: Home. Consults called: Nephrology. Admission status: Inpatient.   Rise Patience MD [facility] Pager [phone].  If 7PM-7AM, please contact night-coverage [email or web address] Password [facility]  05/25/2020, 11:25 PM

## 2020-05-25 NOTE — ED Provider Notes (Signed)
Pt signed out to me by Jamey Ripa, PA-C. Please see previous notes for further history.   In brief, pt presenting for evaluation of blood in PD cath and abd pain. Chills and low grade temp of 100 in the ED. Nephrologist recommended ER eval. On CT, pt was found to have an intraabd fluid collection c/w with abscess vs hematoma. Upon discussion with nephrology, it was decided pt should be admitted to [facility] by medicine with nephrology consulting. She is pending labs prior to admission.   Labs show leukocytosis of 14, otherwise reassuring. Will call for admission.   Discussed with Dr. Landis Gandy from [facility].  There is concern that patient will not be able to get the peritoneal dialysis that she needs, as there is significant boarding and delayed transfer for admission.  Recommends ED to ED transfer so that patient can get dialysis as needed and evaluation by nephrology sooner.  Discussed with Dr. Regenia Skeeter from the [facility], ED, who accepts patient for transfer. Recommend re-consultation with [facility] upon arrival for admission orders.    Franchot Heidelberg, PA-C 05/25/20 2131    Carmin Muskrat, MD 05/25/20 2317

## 2020-05-25 NOTE — ED Notes (Signed)
Attempted to give report, room  Not ready

## 2020-05-25 NOTE — ED Notes (Signed)
Attempted to call report x 4. 

## 2020-05-25 NOTE — ED Triage Notes (Signed)
Pt brought in by EMS due to blood in hemodialysis. Pt reports that she was in the middle of her home peritoneal hemodialysis at 0130 this morning when the machine beeped . Pt noticed blood in tubing. Pt went to sleep and pain in lower abdomen woke her up @1030 

## 2020-05-25 NOTE — ED Notes (Addendum)
hemodialysis nurse reports tracking pt, and 2hr eta arrival time.

## 2020-05-25 NOTE — ED Provider Notes (Signed)
Brief update note.  Patient peritoneal dialysis patient requiring admission for possible infection, possible abscess around peritoneal dialysis catheter site.  Given Vanco and ceftazidime at [facility]. Sent ER to ER to be admitted to [facility] and be able to received dialysis services.   Evaluated patient, she looks well, non toxic. Vitals stable.   Discussed with Hal Hope, will notify [facility] with nephrology   Lucrezia Starch, MD 05/25/20 [phone]

## 2020-05-25 NOTE — ED Notes (Signed)
Labs and blood cultures attempted multiple times by RNs and lab, attempts unsuccessful. Pt complaining of pain, S. [place]acc[place],PA-C aware, orders placed for fentanyl IV push per MAR.

## 2020-05-25 NOTE — ED Provider Notes (Signed)
[facility] EMERGENCY DEPARTMENT Provider Note   CSN: [id number] Arrival date & time: 05/25/20  1428     History Chief Complaint  Patient presents with  . Abdominal Pain    [name] is a 70 y.o. female.  HPI 70 year old female with history of CAD, renal cancer, ESRD on peritoneal dialysis at home, MI, hyperlipidemia, necrotizing fasciitis [place] to the ER with complaints of abdominal pain and blood in her dialysis catheter.  Patient states that she was in the middle of her home dialysis at around 130 this morning with the machine beeped.  She noticed some blood in her tubing.  Went to sleep and woke up with pain to her abdomen at around 1030.  No known fevers but has felt chilled.  No nausea or vomiting.  Bowel movements have been normal.  She states she called her doctor and was told to come to [facility] as not to her nephrologist this, however EMS would not take her there.    Past Medical History:  Diagnosis Date  . Arthritis   . CAD (coronary artery disease)    Cath 2008 total RCA with collaterals  . Cancer ([place])    kidney  . Chronic kidney disease 08/2011   kidney cancer  . Diabetes mellitus   . ESRD (end stage renal disease) ([place])    On dialysis as of 04/2019  . Glaucoma    BILATERAL  . HTN (hypertension), benign   . Hyperlipidemia   . Myocardial infarction ([place]) 2000, 2007  . Neuromuscular disorder ([place])    neck & bilateral hands  . Pneumonia 1990  . Seizures ([place]) 40 years ago   pre eclampsia  . Shortness of breath   . Sleep apnea    uses cpap    Patient Active Problem List   Diagnosis Date Noted  . Chronic kidney disease (CKD), stage V ([place]) 08/04/2019  . Encounter for fitting or adjustment of peritoneal dialysis catheter ([place]) 06/08/2019  . Nephrotic range proteinuria 01/30/2019  . Malignant neoplasm of unspecified kidney, except renal pelvis ([place]) 03/20/2012  . Necrotizing fasciitis ([place]) 11/16/2011  . Pancreatitis 11/16/2011  . Tobacco  use 11/16/2011  . DM (diabetes mellitus) ([place]) 07/18/2011  . MIXED HYPERLIPIDEMIA 09/29/2008  . Essential hypertension 09/29/2008  . CAD 09/29/2008    Past Surgical History:  Procedure Laterality Date  . ABDOMINAL HYSTERECTOMY    . CAPD INSERTION Left 12/18/2019   Procedure: LAPAROSCOPIC INSERTION CONTINUOUS AMBULATORY PERITONEAL DIALYSIS  (CAPD) CATHETER;  Surgeon: Algernon Huxley, MD;  Location: [facility] ORS;  Service: Vascular;  Laterality: Left;  . CAPD REMOVAL Left 12/18/2019   Procedure: LAPAROSCOPIC REMOVAL CONTINUOUS AMBULATORY PERITONEAL DIALYSIS  (CAPD) CATHETER;  Surgeon: Algernon Huxley, MD;  Location: [facility] ORS;  Service: Vascular;  Laterality: Left;  . CORONARY ANGIOPLASTY     2 STENTS  . INSERTION OF DIALYSIS CATHETER N/A 08/27/2019   Procedure: INSERTION OF DIALYSIS CATHETER (PD CATH INSERTION);  Surgeon: Algernon Huxley, MD;  Location: [facility] ORS;  Service: Vascular;  Laterality: N/A;  . IR FLUORO GUIDE CV LINE RIGHT  04/06/2019  . IR REMOVAL TUN CV CATH W/O FL  08/24/2019  . IR US GUIDE VASC ACCESS RIGHT  04/06/2019  . LAPAROSCOPIC LYSIS OF ADHESIONS  08/27/2019   Procedure: LAPAROSCOPIC LYSIS OF ADHESIONS;  Surgeon: Algernon Huxley, MD;  Location: [facility] ORS;  Service: Vascular;;  . 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 NECK SURGERY    .  ROBOTIC ASSITED PARTIAL NEPHRECTOMY Left 2013  . ROTATOR CUFF REPAIR     right arm     OB History   No obstetric history on file.     Family History  Problem Relation Age of Onset  . Heart attack Father     Social History   Tobacco Use  . Smoking status: Former Smoker    Packs/day: 0.50    Years: 20.00    Pack years: 10.00    Types: Cigarettes    Quit date: 05/08/1999    Years since quitting: 21.0  . Smokeless tobacco: Former Systems developer    Types: Snuff  Vaping Use  . Vaping Use: Every day  . Substances: Nicotine  Substance Use Topics  . Alcohol use: Not Currently  . Drug use: No    Home Medications Prior  to Admission medications   Medication Sig Start Date End Date Taking? Authorizing Provider  amLODipine (NORVASC) 10 MG tablet Take 1 tablet by mouth every evening.     [provider]  aspirin EC 81 MG tablet Take 81 mg by mouth daily.    [provider]  B Complex-C (B-COMPLEX WITH VITAMIN C) tablet Take 1 tablet by mouth every morning.     [provider]  brimonidine (ALPHAGAN) 0.2 % ophthalmic solution Place 1 drop into both eyes in the morning and at bedtime. 03/21/20   [provider]  cloNIDine (CATAPRES) 0.2 MG tablet Take 0.2 mg by mouth 2 (two) times daily. 03/15/20   [provider]  Coenzyme Q10 (CO Q 10) 100 MG CAPS Take 1 tablet by mouth at bedtime.    [provider]  ezetimibe-simvastatin (VYTORIN) 10-20 MG tablet Take 1 tablet by mouth at bedtime. 02/24/19   [provider]  hydrALAZINE (APRESOLINE) 50 MG tablet Take 50 mg by mouth 3 (three) times daily. 02/23/20   [provider]  HYDROcodone-acetaminophen (NORCO) 5-325 MG tablet Take 1 tablet by mouth every 6 (six) hours as needed for moderate pain. 12/18/19   Algernon Huxley, MD  HYDROmorphone (DILAUDID) 4 MG tablet Take 4 mg by mouth every 4 (four) hours as needed for severe pain.    [provider]  insulin glargine (LANTUS) 100 UNIT/ML injection Inject 30 Units into the skin at bedtime.     [provider]  isosorbide mononitrate (IMDUR) 30 MG 24 hr tablet Take 30 mg by mouth every morning.    [provider]  metoprolol (TOPROL-XL) 100 MG 24 hr tablet Take 100 mg by mouth every morning.    [provider]  Multiple Vitamin (MULTIVITAMIN) tablet Take 1 tablet by mouth daily.    [provider]  nitroGLYCERIN (NITROSTAT) 0.4 MG SL tablet Place 0.4 mg under the tongue every 5 (five) minutes as needed for chest pain. 07/18/11   Josue Hector, MD  nystatin (MYCOSTATIN) 100000 UNIT/ML suspension Take 5 mLs by mouth as  directed. 04/06/20   [provider]  oxymetazoline (AFRIN) 0.05 % nasal spray Place 2 sprays into the nose 2 (two) times daily.    [provider]  Probiotic Product (PROBIOTIC DAILY PO) Take 1 tablet by mouth daily. gummy    [provider]  ramipril (ALTACE) 10 MG capsule Take 10 mg by mouth at bedtime.    [provider]  senna (SENOKOT) 8.6 MG tablet Take 1 tablet by mouth as needed for constipation.     [provider]  Spirulina 500 MG TABS Take 500 mg  by mouth 3 (three) times daily.    [provider]  timolol (TIMOPTIC) 0.5 % ophthalmic solution Place 1 drop into both eyes in the morning and at bedtime. 03/20/20   [provider]  torsemide (DEMADEX) 20 MG tablet Take by mouth. 08/20/19 08/19/20  [provider]  VITAMIN D PO Take 1 tablet by mouth daily.    [provider]    Allergies    Insect extract allergy skin test, Other, Propine [dipivefrin], Travatan [travoprost], and Xalatan [latanoprost]  Review of Systems   Review of Systems  Constitutional: Positive for chills. Negative for fever.  HENT: Negative for ear pain and sore throat.   Eyes: Negative for pain and visual disturbance.  Respiratory: Negative for cough and shortness of breath.   Cardiovascular: Negative for chest pain and palpitations.  Gastrointestinal: Positive for abdominal pain. Negative for vomiting.  Genitourinary: Negative for dysuria and hematuria.  Musculoskeletal: Negative for arthralgias and back pain.  Skin: Negative for color change and rash.  Neurological: Negative for seizures and syncope.  All other systems reviewed and are negative.   Physical Exam Updated Vital Signs BP (!) 165/63 (BP Location: Right Arm)   Pulse 92   Temp 100 F (37.8 C) (Oral)   Resp 20   SpO2 95%   Physical Exam Vitals and nursing note reviewed.  Constitutional:      General: She is not in acute distress.    Appearance: She is  well-developed and well-nourished.  HENT:     Head: Normocephalic and atraumatic.  Eyes:     Conjunctiva/sclera: Conjunctivae normal.  Cardiovascular:     Rate and Rhythm: Normal rate and regular rhythm.     Heart sounds: No murmur heard.   Pulmonary:     Effort: Pulmonary effort is normal. No respiratory distress.     Breath sounds: Normal breath sounds.  Abdominal:     General: Abdomen is flat. Bowel sounds are normal.     Palpations: Abdomen is soft.     Tenderness: There is no abdominal tenderness.     Hernia: No hernia is present.     Comments: Diffuse abdominal tenderness, mild peritoneal signs, increased tenderness over area of PD catheter  Musculoskeletal:        General: No edema.     Cervical back: Neck supple.  Skin:    General: Skin is warm and dry.  Neurological:     Mental Status: She is alert.  Psychiatric:        Mood and Affect: Mood and affect normal.     ED Results / Procedures / Treatments   Labs (all labs ordered are listed, but only abnormal results are displayed) Labs Reviewed  RESP PANEL BY RT-PCR (FLU A&B, COVID) ARPGX2  CULTURE, BLOOD (ROUTINE X 2)  CULTURE, BLOOD (ROUTINE X 2)  CBC WITH DIFFERENTIAL/PLATELET  COMPREHENSIVE METABOLIC PANEL  LIPASE, BLOOD  LACTIC ACID, PLASMA  LACTIC ACID, PLASMA    EKG None  Radiology CT ABDOMEN PELVIS WO CONTRAST  Result Date: 05/25/2020 CLINICAL DATA:  Bleeding during peritoneal hemodialysis. Lower abdominal pain. EXAM: CT ABDOMEN AND PELVIS WITHOUT CONTRAST TECHNIQUE: Multidetector CT imaging of the abdomen and pelvis was performed following the standard protocol without IV contrast. COMPARISON:  09/14/2016 FINDINGS: Lower chest: Circumflex and right coronary artery atherosclerotic calcification mild cardiomegaly. C bandlike densities in the lingula, lower lobes, and right middle lobe some of which likely represents scarring but a component may reflect atelectasis. Hepatobiliary: Small layering  gallstones in  the gallbladder is shown on image 28 series 2. Mildly prominent gallbladder caliber. Pancreas: Unremarkable Spleen: Unremarkable Adrenals/Urinary Tract: 1.1 by 1.5 cm nonspecific nodule of the medial limb of the left adrenal gland, unchanged from 2018 hence likely benign. Various renal lesions are present, some of which are complex bilaterally a left kidney lower pole exophytic mildly hyperdense lesion measures 2.6 cm transverse, previously 1.8 cm transverse. Strictly speaking, renal masses are not excluded. No nephrolithiasis identified. Stomach/Bowel: Mild wall thickening in the ascending colon, potentially partially from nondistention and fatty deposition in the colon wall, although a component of colitis is difficult to exclude. Appendix unremarkable. No dilated small bowel identified. Vascular/Lymphatic: Aortoiliac atherosclerotic vascular disease. No pathologic adenopathy identified. Reproductive: Uterus absent. Adnexa unremarkable. Other: Notable subcutaneous and flank edema. Small amount of ascites in the left lower quadrant around the peritoneal dialysis catheter looped. Ascites has a density of about 13 Hounsfield units. Mild mesenteric and omental edema along with mild presacral edema. Along the deep subcutaneous portion of the peritoneal dialysis catheter, there is an indistinctly marginated 2.7 by 3.4 by 3.1 cm collection of fluid which could be abscess or hematoma, as shown on image 36 of series 5. Surrounding mildly confluent inflammatory stranding in this vicinity. Musculoskeletal: Thoracic and lumbar spondylosis. IMPRESSION: 1. Along the deep subcutaneous portion of the peritoneal dialysis catheter, there is an indistinctly marginated 2.7 by 3.4 by 3.1 cm collection of fluid which could be abscess or hematoma. Surrounding mildly confluent inflammatory stranding in this vicinity. 2. Small amount of ascites in the left lower quadrant around the peritoneal dialysis catheter looped. 3.  Mild wall thickening in the ascending colon, potentially partially from nondistention and fatty deposition in the colon wall, although a component of colitis is difficult to exclude. 4. Various renal lesions are present, some of which are complex bilaterally. Strictly speaking, renal masses are not excluded. 5. Other imaging findings of potential clinical significance: Coronary atherosclerosis with mild cardiomegaly. Cholelithiasis. Stable nonspecific 1.1 by 1.5 cm nodule of the medial limb of the left adrenal gland, unchanged from 2018 hence likely benign. C bandlike densities in the lingula, lower lobes, and right middle lobe some of which likely represents scarring but a component may reflect atelectasis. 6. Aortic atherosclerosis. Aortic Atherosclerosis (ICD10-I70.0). Electronically Signed   By: Van Clines M.D.   On: 05/25/2020 16:43    Procedures .Critical Care Performed by: Garald Balding, PA-C Authorized by: Garald Balding, PA-C   Critical care provider statement:    Critical care time (minutes):  55   Critical care was necessary to treat or prevent imminent or life-threatening deterioration of the following conditions:  Sepsis and renal failure   Critical care was time spent personally by me on the following activities:  Discussions with consultants, evaluation of patient's response to treatment, examination of patient, ordering and performing treatments and interventions, ordering and review of laboratory studies, ordering and review of radiographic studies, pulse oximetry, re-evaluation of patient's condition, obtaining history from patient or surrogate and review of old charts   (including critical care time)  Medications Ordered in ED Medications  vancomycin (VANCOCIN) IVPB 1000 mg/200 mL premix (has no administration in time range)  cefTAZidime (FORTAZ) 2 g in sodium chloride 0.9 % 100 mL IVPB (has no administration in time range)    ED Course  I have reviewed the triage  vital signs and the nursing notes.  Pertinent labs & imaging results that were available during my care of the patient were reviewed  by me and considered in my medical decision making (see chart for details).    MDM Rules/Calculators/A&P                           70 year old female presenting with blood in her peritoneal dialysis catheter and abdominal pain.  Mildly hypertensive, borderline fever of 100 on arrival.  She has some diffuse abdominal tenderness on exam concerning for SBP in the setting of borderline fever.  Will initiate lab work, blood culture, CT of the abdomen pelvis without contrast.  I spoke with Dr. Moshe Cipro the on-call nephrologist for any pain, who states that there are no dialysis nurses who are comfortable with taking a sample from her PD catheter.  She recommended reaching out to the patient's home dialysis team, with whom I spoke over the phone.  They state that this is out of their scope of practice and they are not able to come to the patient.  I then spoke with Dr. [name] who is her nephrologist over in [facility], who states that the patient could be transferred ED to ED for further management.  He recommends 1 g of Vanco and ceftazidime to treat her prophylactically.  There are currently no beds at [facility].  I spoke with Dr. Tamala Julian ER attending over at [facility] who states that there are no beds and the patient will likely require an escalation of care.  They would like for her to be transferred over to [facility].  CT of the abdomen with a collection of fluid which may be abscess or hematoma deep to the peritoneal dialysis catheter, mild wall thickening in the ascending colon, partially from nondistention fatty deposition, although a component of colitis is difficult to exclude.  She also has some various renal lesions noted, as well as cholelithiasis without cholecystitis.  We will start on prophylactic antibiotics vancomycin and ceftazidime as per  discussion with Dr. [name].  5:16 PM spoke with Dr. Joelyn Oms with Nephrology who will accept the patient for transfer to [facility]. Spoke with Dr. Regenia Skeeter who will accept the patient foe ED to ED transfer  COVID test is negative.  5:45 PM: Patient is requiring ultrasound-guided IV access.  Spoke with Dr. Vanita Panda, pending.  6:08PM: Signed out care to [place] PA-C who will oversee her labwork and admit to hospitalist team.   This was a shared visit with my supervising physicians Dr. Almyra Free and Dr. Vanita Panda who independently saw and evaluated the patient & provided guidance in evaluation/management/disposition ,in agreement with care   Final Clinical Impression(s) / ED Diagnoses Final diagnoses:  Infection of peritoneal dialysis catheter site, initial encounter [facility])    Rx / DC Orders ED Discharge Orders    None       Lyndel Safe 05/25/20 1811    Carmin Muskrat, MD 05/25/20 2316

## 2020-05-25 NOTE — Progress Notes (Signed)
Pharmacy Antibiotic Note  [name] is a 70 y.o. female admitted on 05/25/2020 with sepsis - pt on home peritoneal dialysis with possible abscess around peritoneal dialysis site. Appears plan to transition to IHD for now. Pharmacy has been consulted for Vancomycin and Cefepime dosing.  Ceftazidime and Vanc given at [facility] ED 1/19 pm  Plan: Cefepime 1gm IV q24h - start 24 hours post ceftazidime dose Vancomycin 750mg  IV now to complete loading dose. Will f/u HD plans for further Vanc dosing Will f/u  micro data and and pt's clinical condition Vanc levels prn     Temp (24hrs), Avg:99.7 F (37.6 C), Min:99 F (37.2 C), Max:100 F (37.8 C)  Recent Labs  Lab 05/25/20 1835  WBC 14.7*  CREATININE 9.28*    CrCl cannot be calculated (Unknown ideal weight.).    Allergies  Allergen Reactions  . Insect Extract Allergy Skin Test Swelling  . Other     Optical meds unknown to pt  . Propine [Dipivefrin] Itching and Other (See Comments)    Makes eyes turn a reddish orange color.  . Travatan [Travoprost] Itching and Other (See Comments)    Makes eyes turn a reddish orange color  . Xalatan [Latanoprost] Itching and Other (See Comments)    Makes eyes turn a reddish orange color.     Antimicrobials this admission: 1/19 Ceftazidime x 1 1/20 Cefepime >>  1/19 Vanc >>   Microbiology results: 1/19 BCx:  1/19 peritoneal dialysis fluid:  Thank you for allowing pharmacy to be a part of this patient's care.  Sherlon Handing, PharmD, BCPS Please see amion for complete clinical pharmacist phone list 05/25/2020 11:32 PM

## 2020-05-25 NOTE — ED Notes (Signed)
Pt a hard stick refusing blood draw that this time.

## 2020-05-25 NOTE — ED Notes (Signed)
Attempted to call report x 5.

## 2020-05-25 NOTE — ED Notes (Signed)
Up to bathroom with assistance.   Visible SOB with exertion.  Pt states this SOB is new.  Attempted to find IV access and unable.

## 2020-05-26 ENCOUNTER — Encounter (HOSPITAL_COMMUNITY): Payer: Self-pay | Admitting: Internal Medicine

## 2020-05-26 ENCOUNTER — Inpatient Hospital Stay (HOSPITAL_COMMUNITY): Payer: Medicare Other

## 2020-05-26 DIAGNOSIS — T8571XA Infection and inflammatory reaction due to peritoneal dialysis catheter, initial encounter: Secondary | ICD-10-CM | POA: Diagnosis not present

## 2020-05-26 LAB — BODY FLUID CELL COUNT WITH DIFFERENTIAL
Eos, Fluid: 0 %
Lymphs, Fluid: 18 %
Monocyte-Macrophage-Serous Fluid: 9 % — ABNORMAL LOW (ref 50–90)
Neutrophil Count, Fluid: 73 % — ABNORMAL HIGH (ref 0–25)
Total Nucleated Cell Count, Fluid: 500 cu mm (ref 0–1000)

## 2020-05-26 LAB — COMPREHENSIVE METABOLIC PANEL
ALT: 15 U/L (ref 0–44)
AST: 16 U/L (ref 15–41)
Albumin: 1.4 g/dL — ABNORMAL LOW (ref 3.5–5.0)
Alkaline Phosphatase: 41 U/L (ref 38–126)
Anion gap: 12 (ref 5–15)
BUN: 64 mg/dL — ABNORMAL HIGH (ref 8–23)
CO2: 26 mmol/L (ref 22–32)
Calcium: 7.8 mg/dL — ABNORMAL LOW (ref 8.9–10.3)
Chloride: 99 mmol/L (ref 98–111)
Creatinine, Ser: 9.62 mg/dL — ABNORMAL HIGH (ref 0.44–1.00)
GFR, Estimated: 4 mL/min — ABNORMAL LOW (ref >60)
Glucose, Bld: 135 mg/dL — ABNORMAL HIGH (ref 70–99)
Potassium: 3.6 mmol/L (ref 3.5–5.1)
Sodium: 137 mmol/L (ref 135–145)
Total Bilirubin: 0.5 mg/dL (ref 0.3–1.2)
Total Protein: 4.7 g/dL — ABNORMAL LOW (ref 6.5–8.1)

## 2020-05-26 LAB — CBC
HCT: 23.7 % — ABNORMAL LOW (ref 36.0–46.0)
Hemoglobin: 7.6 g/dL — ABNORMAL LOW (ref 12.0–15.0)
MCH: 30.8 pg (ref 26.0–34.0)
MCHC: 32.1 g/dL (ref 30.0–36.0)
MCV: 96 fL (ref 80.0–100.0)
Platelets: 340 10*3/uL (ref 150–400)
RBC: 2.47 MIL/uL — ABNORMAL LOW (ref 3.87–5.11)
RDW: 15.1 % (ref 11.5–15.5)
WBC: 10.6 10*3/uL — ABNORMAL HIGH (ref 4.0–10.5)
nRBC: 0 % (ref 0.0–0.2)

## 2020-05-26 LAB — GLUCOSE, CAPILLARY
Glucose-Capillary: 142 mg/dL — ABNORMAL HIGH (ref 70–99)
Glucose-Capillary: 160 mg/dL — ABNORMAL HIGH (ref 70–99)
Glucose-Capillary: 163 mg/dL — ABNORMAL HIGH (ref 70–99)
Glucose-Capillary: 181 mg/dL — ABNORMAL HIGH (ref 70–99)

## 2020-05-26 LAB — MRSA PCR SCREENING: MRSA by PCR: NEGATIVE

## 2020-05-26 LAB — HEMOGLOBIN A1C
Hgb A1c MFr Bld: 7.1 % — ABNORMAL HIGH (ref 4.8–5.6)
Mean Plasma Glucose: 157.07 mg/dL

## 2020-05-26 LAB — LACTIC ACID, PLASMA: Lactic Acid, Venous: 0.9 mmol/L (ref 0.5–1.9)

## 2020-05-26 LAB — VANCOMYCIN, RANDOM: Vancomycin Rm: 13

## 2020-05-26 LAB — HIV ANTIBODY (ROUTINE TESTING W REFLEX): HIV Screen 4th Generation wRfx: NONREACTIVE

## 2020-05-26 LAB — CBG MONITORING, ED: Glucose-Capillary: 99 mg/dL (ref 70–99)

## 2020-05-26 LAB — PATHOLOGIST SMEAR REVIEW

## 2020-05-26 MED ORDER — HEPARIN SODIUM (PORCINE) 5000 UNIT/ML IJ SOLN: 5000.0000 [IU] | Freq: Three times a day (TID) | INTRAMUSCULAR | Status: DC

## 2020-05-26 MED ORDER — HYDROMORPHONE HCL 2 MG PO TABS
2.0000 mg | ORAL_TABLET | Freq: Four times a day (QID) | ORAL | Status: DC | PRN
  Administered 2020-05-26 (×2): 2 mg via ORAL
  Filled 2020-05-26 (×2): qty 1

## 2020-05-26 MED ORDER — SODIUM CHLORIDE 0.9 % IV SOLN
1.0000 g | INTRAVENOUS | Status: DC
  Administered 2020-05-27: 1 g via INTRAVENOUS
  Filled 2020-05-26: qty 1

## 2020-05-26 MED ORDER — GENTAMICIN SULFATE 0.1 % EX CREA
1.0000 "application " | TOPICAL_CREAM | Freq: Every day | CUTANEOUS | Status: DC
  Administered 2020-05-27 – 2020-05-31 (×5): 1 via TOPICAL
  Filled 2020-05-26: qty 15

## 2020-05-26 MED ORDER — LIDOCAINE HCL (PF) 1 % IJ SOLN
INTRAMUSCULAR | Status: AC
  Filled 2020-05-26: qty 30

## 2020-05-26 MED ORDER — DELFLEX-LC/2.5% DEXTROSE 394 MOSM/L IP SOLN: INTRAPERITONEAL | Status: DC

## 2020-05-26 MED ORDER — HEPARIN 1000 UNIT/ML FOR PERITONEAL DIALYSIS
INTRAPERITONEAL | Status: DC | PRN
  Filled 2020-05-26 (×5): qty 5000

## 2020-05-26 MED ORDER — HEPARIN 1000 UNIT/ML FOR PERITONEAL DIALYSIS: 500.0000 [IU] | INTRAMUSCULAR | Status: DC | PRN

## 2020-05-26 MED ORDER — VANCOMYCIN HCL IN DEXTROSE 1-5 GM/200ML-% IV SOLN
1000.0000 mg | Freq: Once | INTRAVENOUS | Status: AC
  Administered 2020-05-26: 1000 mg via INTRAVENOUS
  Filled 2020-05-26: qty 200

## 2020-05-26 MED ORDER — HEPARIN SODIUM (PORCINE) 5000 UNIT/ML IJ SOLN
5000.0000 [IU] | Freq: Three times a day (TID) | INTRAMUSCULAR | Status: DC
  Administered 2020-05-26 – 2020-05-28 (×4): 5000 [IU] via SUBCUTANEOUS
  Filled 2020-05-26 (×4): qty 1

## 2020-05-26 NOTE — Progress Notes (Signed)
@   9987 arrived at patient bedside to retrieve pd fluid for microbiology labs.

## 2020-05-26 NOTE — Progress Notes (Signed)
Pharmacy Antibiotic Note  [name] is a 70 y.o. female admitted on 05/25/2020 with sepsis - pt on home peritoneal dialysis with possible abscess around peritoneal dialysis site. Pharmacy has been consulted for Vancomycin and Cefepime dosing.  Attempting CCPD for now - may need IHD if unable to tolerate. Antibiotics being dosed intravenously for now but can transition to IP if not resolving.   A Vancomycin random level resulted as 13 mcg/ml this afternoon confirming the second half of the LD was not given. Will supplement today.  Plan: Cont Cefepime 1gm IV q24h - will adjust to start on 1/21 AM after CCPD overnight Vancomycin 1g IV x 1 to complete the loading dose Will consider a VR in 2-3 days for re-dosing Can consider transitioning to IP antibiotic dosing if not improving on IV Will continue to follow CCPD schedule/duration, culture results, LOT, and antibiotic de-escalation plans   Height: 5' (152.4 cm) Weight: 84.9 kg (187 lb 2.7 oz) IBW/kg (Calculated) : 45.5  Temp (24hrs), Avg:98.9 F (37.2 C), Min:98.2 F (36.8 C), Max:100 F (37.8 C)  Recent Labs  Lab 05/25/20 1835 05/26/20 0321  WBC 14.7* 10.6*  CREATININE 9.28* 9.62*  LATICACIDVEN  --  0.9    Estimated Creatinine Clearance: 5.4 mL/min (A) (by C-G formula based on SCr of 9.62 mg/dL (H)).    Allergies  Allergen Reactions  . Insect Extract Allergy Skin Test Swelling  . Other     Optical meds unknown to pt  . Propine [Dipivefrin] Itching and Other (See Comments)    Makes eyes turn a reddish orange color.  . Travatan [Travoprost] Itching and Other (See Comments)    Makes eyes turn a reddish orange color  . Xalatan [Latanoprost] Itching and Other (See Comments)    Makes eyes turn a reddish orange color.     Antimicrobials this admission: 1/19 Ceftazidime x 1 1/20 Cefepime >>  1/19 Vanc >>   Microbiology results: 1/19 BCx:  1/19 PD fluid: 1/20 PD fluid >> 1/20 MRSA PCR >> neg  Thank you for allowing  pharmacy to be a part of this patient's care.  Alycia Rossetti, PharmD, BCPS Clinical Pharmacist Clinical phone for 05/26/2020: [phone] 05/26/2020 2:52 PM   **Pharmacist phone directory can now be found on [email or web address] ([email or web address]).  Listed under [place].

## 2020-05-26 NOTE — Progress Notes (Signed)
PROGRESS NOTE  [name] [name]:[id number] DOB: [birth year] DOA: 05/25/2020 PCP: Lucia Gaskins, MD  HPI/Recap of past 24 hours: [name] is a 70 y.o. female with history of ESRD on peritoneal dialysis has been experiencing return fluid from the peritoneal catheter to be mildly bloody with increasing abdominal pain mostly in the lower quadrants over the last 24 hours.  Has subjective feeling of chills.  Denies any nausea vomiting or diarrhea.  ED Course: Patient had initially presented to the ER at [facility] and CT abdomen pelvis shows features concerning for possible hematoma versus abscess in the deep portion of the peritoneal dialysis catheter.  Nephrology was consulted requested getting cultures and starting antibiotics and patient was transferred to [facility] for further management.  COVID test was negative.  Labs are significant for WBC count of 14.7 hemoglobin of 8.9 potassium was 3.7.   05/26/20: Patient was seen and examined at her bedside.  She reports her abdominal pain is improving.  She denies any nausea.  Her last PD was 2 nights ago.  She was seen by nephrology with plan to continue PD using outpatient prescription but at 2.5% dextrose.  Possible abscess on CT abdomen and pelvis without contrast done on 05/25/2020.  General surgery consultation to further assess.  Assessment/Plan: Principal Problem:   Infection associated with peritoneal dialysis catheter [facility]) Active Problems:   Mixed hyperlipidemia   Essential hypertension   Coronary atherosclerosis   DM (diabetes mellitus) ([name])  Presumptive infection associated with peritoneal dialysis catheter CT abdomen and pelvis without contrast on 05/25/2020 revealing possible abscess, general surgery consultation. She was started on broad-spectrum IV antibiotics cefepime and IV vancomycin, continue Follow PD fluid culture and analysis. Blood cultures drawn peripherally, continue to follow. Nephrology has been consulted,  plan is to continue PD using outpatient prescription but at 2.5% dextrose.  ESRD on PD  Management as stated above Appreciate nephrology's assistance  Anemia of chronic disease in the setting of ESRD Baseline hemoglobin appears to be 10 Presented with hemoglobin of 8.9 Drop in hemoglobin down to 7.6. No overt bleeding. Continue to monitor H&H  Type 2 diabetes with hyperglycemia Last hemoglobin A1c 7.1 Continue insulin coverage Avoid hypoglycemia in the setting of ESRD.  Essential hypertension BP is at goal Continue home regimen  Hyperlipidemia Continue home regimen  Physical debility/ambulatory dysfunction She ambulates with a walker PT to assess Continue PT OT with assistance  Continue fall precautions.   Code Status: Full code  Family Communication: None at bedside  Disposition Plan: Likely will DC to home with home health services.   Consultants:  Nephrology  Procedures:  PD likely on 05/26/2020  Antimicrobials:  Cefepime 05/25/2020>>  IV vancomycin 05/25/2020>>  DVT prophylaxis: Subcu heparin 3 times daily  Status is: Inpatient   Dispo:  Patient From: Home  Planned Disposition: Home with Health Care Svc  Expected discharge date: 05/28/2020  Medically stable for discharge: No, ongoing management of presumptive infection involving her PD.         Objective: Vitals:   05/26/20 0107 05/26/20 0155 05/26/20 0500 05/26/20 0826  BP: (!) 143/59  (!) 145/70 (!) 148/51  Pulse: 84  77 80  Resp: 19  17 17   Temp:  98.4 F (36.9 C) 98.7 F (37.1 C) 98.2 F (36.8 C)  TempSrc:  Oral Oral Oral  SpO2: 97%  96% 98%  Weight:      Height:        Intake/Output Summary (Last 24 hours) at  05/26/2020 1319 Last data filed at 05/25/2020 2254 Gross per 24 hour  Intake 200 ml  Output -  Net 200 ml   Filed Weights   05/25/20 2331  Weight: 84.9 kg    Exam:  . General: 70 y.o. year-old female well developed well nourished in no acute distress.  Alert  and oriented x3. . Cardiovascular: Regular rate and rhythm with no rubs or gallops.  No thyromegaly or JVD noted.   Marland Kitchen Respiratory: Clear to auscultation with no wheezes or rales. Good inspiratory effort. . Abdomen: Soft tender at the site of PD catheter.  Mildly distended.  Bowel sounds present.   . Musculoskeletal: 3+ edema in lower extremities bilaterally.   . Skin: No ulcerative lesions noted or rashes, . Psychiatry: Mood is appropriate for condition and setting   Data Reviewed: CBC: Recent Labs  Lab 05/25/20 1835 05/26/20 0321  WBC 14.7* 10.6*  NEUTROABS 11.9*  --   HGB 8.9* 7.6*  HCT 28.7* 23.7*  MCV 97.3 96.0  PLT 427* 937   Basic Metabolic Panel: Recent Labs  Lab 05/25/20 1835 05/26/20 0321  NA 137 137  K 3.7 3.6  CL 97* 99  CO2 29 26  GLUCOSE 63* 135*  BUN 65* 64*  CREATININE 9.28* 9.62*  CALCIUM 8.2* 7.8*   GFR: Estimated Creatinine Clearance: 5.4 mL/min (A) (by C-G formula based on SCr of 9.62 mg/dL (H)). Liver Function Tests: Recent Labs  Lab 05/25/20 1835 05/26/20 0321  AST 19 16  ALT 17 15  ALKPHOS 46 41  BILITOT 0.2* 0.5  PROT 5.7* 4.7*  ALBUMIN 1.8* 1.4*   Recent Labs  Lab 05/25/20 1835  LIPASE 18   No results for input(s): AMMONIA in the last 168 hours. Coagulation Profile: No results for input(s): INR, PROTIME in the last 168 hours. Cardiac Enzymes: No results for input(s): CKTOTAL, CKMB, CKMBINDEX, TROPONINI in the last 168 hours. BNP (last 3 results) No results for input(s): PROBNP in the last 8760 hours. HbA1C: Recent Labs    05/26/20 0321  HGBA1C 7.1*   CBG: Recent Labs  Lab 05/26/20 0113 05/26/20 0637 05/26/20 1221  GLUCAP 99 142* 160*   Lipid Profile: No results for input(s): CHOL, HDL, LDLCALC, TRIG, CHOLHDL, LDLDIRECT in the last 72 hours. Thyroid Function Tests: No results for input(s): TSH, T4TOTAL, FREET4, T3FREE, THYROIDAB in the last 72 hours. Anemia Panel: No results for input(s): VITAMINB12, FOLATE,  FERRITIN, TIBC, IRON, RETICCTPCT in the last 72 hours. Urine analysis:    Component Value Date/Time   COLORURINE YELLOW 01/03/2007 1041   APPEARANCEUR CLEAR 01/03/2007 1041   LABSPEC 1.015 01/03/2007 1041   PHURINE 6.5 01/03/2007 1041   GLUCOSEU NEGATIVE 01/03/2007 [place] 01/03/2007 [place] 01/03/2007 [place] 01/03/2007 1041   PROTEINUR NEGATIVE 01/03/2007 1041   UROBILINOGEN 0.2 01/03/2007 1041   NITRITE NEGATIVE 01/03/2007 1041   LEUKOCYTESUR  01/03/2007 1041    NEGATIVE MICROSCOPIC NOT DONE ON URINES WITH NEGATIVE PROTEIN, BLOOD, LEUKOCYTES, NITRITE, OR GLUCOSE <1000 mg/dL.   Sepsis Labs: @LABRCNTIP (procalcitonin:4,lacticidven:4)  ) Recent Results (from the past 240 hour(s))  Resp Panel by RT-PCR (Flu A&B, Covid) Nasopharyngeal Swab     Status: None   Collection Time: 05/25/20  4:00 PM   Specimen: Nasopharyngeal Swab; Nasopharyngeal(NP) swabs in vial transport medium  Result Value Ref Range Status   SARS Coronavirus 2 by RT PCR NEGATIVE NEGATIVE Final    Comment: (NOTE) SARS-CoV-2 target nucleic acids are NOT  DETECTED.  The SARS-CoV-2 RNA is generally detectable in upper respiratory specimens during the acute phase of infection. The lowest concentration of SARS-CoV-2 viral copies this assay can detect is 138 copies/mL. A negative result does not preclude SARS-Cov-2 infection and should not be used as the sole basis for treatment or other patient management decisions. A negative result may occur with  improper specimen collection/handling, submission of specimen other than nasopharyngeal swab, presence of viral mutation(s) within the areas targeted by this assay, and inadequate number of viral copies(<138 copies/mL). A negative result must be combined with clinical observations, patient history, and epidemiological information. The expected result is Negative.  Fact Sheet for Patients:   EntrepreneurPulse.com.au  Fact Sheet for Healthcare Providers:  IncredibleEmployment.be  This test is no t yet approved or cleared by the [place] FDA and  has been authorized for detection and/or diagnosis of SARS-CoV-2 by FDA under an Emergency Use Authorization (EUA). This EUA will remain  in effect (meaning this test can be used) for the duration of the COVID-19 declaration under Section 564(b)(1) of the Act, 21 U.S.C.section 360bbb-3(b)(1), unless the authorization is terminated  or revoked sooner.       Influenza A by PCR NEGATIVE NEGATIVE Final   Influenza B by PCR NEGATIVE NEGATIVE Final    Comment: (NOTE) The Xpert Xpress SARS-CoV-2/FLU/RSV plus assay is intended as an aid in the diagnosis of influenza from Nasopharyngeal swab specimens and should not be used as a sole basis for treatment. Nasal washings and aspirates are unacceptable for Xpert Xpress SARS-CoV-2/FLU/RSV testing.  Fact Sheet for Patients: EntrepreneurPulse.com.au  Fact Sheet for Healthcare Providers: IncredibleEmployment.be  This test is not yet approved or cleared by the [place] FDA and has been authorized for detection and/or diagnosis of SARS-CoV-2 by FDA under an Emergency Use Authorization (EUA). This EUA will remain in effect (meaning this test can be used) for the duration of the COVID-19 declaration under Section 564(b)(1) of the Act, 21 U.S.C. section 360bbb-3(b)(1), unless the authorization is terminated or revoked.  Performed at [facility], [address] [place], [place] [address]   Blood culture (routine x 2)     Status: None (Preliminary result)   Collection Time: 05/25/20  6:35 PM   Specimen: BLOOD  Result Value Ref Range Status   Specimen Description BLOOD RIGHT ANTECUBITAL  Final   Special Requests   Final    BOTTLES DRAWN AEROBIC AND ANAEROBIC Blood Culture adequate volume   Culture   Final     NO GROWTH < 24 HOURS Performed at [facility], [address] [place], [place] [address]    Report Status PENDING  Incomplete  Body fluid culture     Status: None (Preliminary result)   Collection Time: 05/26/20  2:30 AM   Specimen: Body Fluid  Result Value Ref Range Status   Specimen Description FLUID PDFL  Final   Special Requests NONE  Final   Gram Stain   Final    ABUNDANT WBC PRESENT, PREDOMINANTLY PMN NO ORGANISMS SEEN Performed at [facility], [address], [place],  [address]    Culture PENDING  Incomplete   Report Status PENDING  Incomplete  MRSA PCR Screening     Status: None   Collection Time: 05/26/20 10:23 AM   Specimen: Nasopharyngeal  Result Value Ref Range Status   MRSA by PCR NEGATIVE NEGATIVE Final    Comment:        The GeneXpert MRSA Assay (FDA approved for NASAL  specimens only), is one component of a comprehensive MRSA colonization surveillance program. It is not intended to diagnose MRSA infection nor to guide or monitor treatment for MRSA infections. Performed at [facility], [place] [address], [place], [place] [address]       Studies: CT ABDOMEN PELVIS WO CONTRAST  Result Date: 05/25/2020 CLINICAL DATA:  Bleeding during peritoneal hemodialysis. Lower abdominal pain. EXAM: CT ABDOMEN AND PELVIS WITHOUT CONTRAST TECHNIQUE: Multidetector CT imaging of the abdomen and pelvis was performed following the standard protocol without IV contrast. COMPARISON:  09/14/2016 FINDINGS: Lower chest: Circumflex and right coronary artery atherosclerotic calcification mild cardiomegaly. C bandlike densities in the lingula, lower lobes, and right middle lobe some of which likely represents scarring but a component may reflect atelectasis. Hepatobiliary: Small layering gallstones in the gallbladder is shown on image 28 series 2. Mildly prominent gallbladder caliber. Pancreas: Unremarkable Spleen: Unremarkable Adrenals/Urinary Tract: 1.1 by 1.5 cm  nonspecific nodule of the medial limb of the left adrenal gland, unchanged from 2018 hence likely benign. Various renal lesions are present, some of which are complex bilaterally a left kidney lower pole exophytic mildly hyperdense lesion measures 2.6 cm transverse, previously 1.8 cm transverse. Strictly speaking, renal masses are not excluded. No nephrolithiasis identified. Stomach/Bowel: Mild wall thickening in the ascending colon, potentially partially from nondistention and fatty deposition in the colon wall, although a component of colitis is difficult to exclude. Appendix unremarkable. No dilated small bowel identified. Vascular/Lymphatic: Aortoiliac atherosclerotic vascular disease. No pathologic adenopathy identified. Reproductive: Uterus absent. Adnexa unremarkable. Other: Notable subcutaneous and flank edema. Small amount of ascites in the left lower quadrant around the peritoneal dialysis catheter looped. Ascites has a density of about 13 Hounsfield units. Mild mesenteric and omental edema along with mild presacral edema. Along the deep subcutaneous portion of the peritoneal dialysis catheter, there is an indistinctly marginated 2.7 by 3.4 by 3.1 cm collection of fluid which could be abscess or hematoma, as shown on image 36 of series 5. Surrounding mildly confluent inflammatory stranding in this vicinity. Musculoskeletal: Thoracic and lumbar spondylosis. IMPRESSION: 1. Along the deep subcutaneous portion of the peritoneal dialysis catheter, there is an indistinctly marginated 2.7 by 3.4 by 3.1 cm collection of fluid which could be abscess or hematoma. Surrounding mildly confluent inflammatory stranding in this vicinity. 2. Small amount of ascites in the left lower quadrant around the peritoneal dialysis catheter looped. 3. Mild wall thickening in the ascending colon, potentially partially from nondistention and fatty deposition in the colon wall, although a component of colitis is difficult to exclude.  4. Various renal lesions are present, some of which are complex bilaterally. Strictly speaking, renal masses are not excluded. 5. Other imaging findings of potential clinical significance: Coronary atherosclerosis with mild cardiomegaly. Cholelithiasis. Stable nonspecific 1.1 by 1.5 cm nodule of the medial limb of the left adrenal gland, unchanged from 2018 hence likely benign. C bandlike densities in the lingula, lower lobes, and right middle lobe some of which likely represents scarring but a component may reflect atelectasis. 6. Aortic atherosclerosis. Aortic Atherosclerosis (ICD10-I70.0). Electronically Signed   By: Van Clines M.D.   On: 05/25/2020 16:43    Scheduled Meds: . amLODipine  10 mg Oral QPM  . aspirin EC  81 mg Oral Daily  . brimonidine  1 drop Both Eyes TID  . cloNIDine  0.2 mg Oral BID  . ezetimibe  10 mg Oral QHS   And  . simvastatin  20 mg Oral QHS  . gentamicin  cream  1 application Topical Daily  . hydrALAZINE  50 mg Oral TID  . insulin aspart  0-6 Units Subcutaneous TID WC  . insulin glargine  30 Units Subcutaneous QHS  . isosorbide mononitrate  30 mg Oral Daily  . metoprolol succinate  100 mg Oral Daily  . timolol  1 drop Both Eyes Daily  . torsemide  20 mg Oral Daily  . vancomycin variable dose per unstable renal function (pharmacist dosing)   Does not apply See admin instructions    Continuous Infusions: . ceFEPime (MAXIPIME) IV    . dialysis solution 2.5% low-MG/low-CA    . vancomycin       LOS: 1 day     Kayleen Memos, MD [facility] Pager [phone]  If 7PM-7AM, please contact night-coverage [email or web address] Password [email or web address] 05/26/2020, 1:19 PM

## 2020-05-26 NOTE — Progress Notes (Incomplete)
Pt arrived to 5N, settled into a clean bed and gown, sandwich provided. Telemetry placed and vitals taken. Pt is refusing to have her antibiotic IV at this time. Pt educated and expressed understanding. Will revisit this topic.

## 2020-05-26 NOTE — Procedures (Signed)
Interventional Radiology Procedure Note  Procedure: Image guided aspiration of anterior abd wall fluid.  Under [place], the fluid is complex, non-compressible.    ~1-2cc of old blood products aspirated for cx  Complications: None  EBL: None Sample: Culture sent  Recommendations: - Routine care - follow up cx  Signed,  Dulcy Fanny. Earleen Newport, DO

## 2020-05-26 NOTE — ED Notes (Signed)
Pt refusing IV insertion from current ULS tech, states she doesn't want to be stuck anymore

## 2020-05-26 NOTE — Progress Notes (Signed)
Renard Matter 1951-05-05  [id number].    Requesting MD: Dr. Nevada Crane Chief Complaint/Reason for Consult:   HPI: [name] is a 70 y.o. female with a history of ESRD on PD, HTN, HLD, CAD and DM2 who presented to the ED on 1/19 for abdominal pain and blood in PD catheter.   Patient reports she awoke around 1:30 AM on the morning of 1/19 with pain around her dialysis catheter as well as blood in her tubing. Prior to falling to sleep that night she was in her normal state of health and denied issues with her PD catheter or abdominal pain.  She presented to the ED for evaluation.  She is found to have a WBC of 14.7.  CT showed along a deep subcutaneous portion of the PD catheter there is an indistinctly marginated 2.7 x 3.4 x 3.1 fluid collection that could be abscess or hematoma.  PD catheter was originally inserted by Dr. Lucky Cowboy over at [facility] on 12/18/2019.  Per his op note, the deep cuff was placed just above the peritoneum where it was not visualized in the peritoneum itself but was at the level of the fascia.  He made a counterincision several centimeters lateral to the initial exit site and the superficial cuff was parked between these 2 incisions.  Patient was transferred to [facility] for admission.  She was admitted by [facility].  Nephrology has seen the patient.  Patient was started on antibiotics (vancomycin and cefepime) for possible PD peritonitis.  Per notes it appears that her PD fluid culture was obtained after the initiation of antibiotics however.  Nephrology plans to continue broad-spectrum antibiotics and repeat cell counts on 1/21 and 1/22. We were asked to see for the fluid collection around the deep subcutaneous portion of PD. She reports since admission her abdominal pain has improved. She is tolerating a diet without n/v.    ROS: Review of Systems  Constitutional: Negative for chills and fever.  Respiratory: Negative for cough and shortness of breath.   Cardiovascular: Negative for chest  pain.  Gastrointestinal: Positive for abdominal pain. Negative for nausea and vomiting.  All other systems reviewed and are negative.   Family History  Problem Relation Age of Onset  . Heart attack Father     Past Medical History:  Diagnosis Date  . Arthritis   . CAD (coronary artery disease)    Cath 2008 total RCA with collaterals  . Cancer ([place])    kidney  . Chronic kidney disease 08/2011   kidney cancer  . Diabetes mellitus   . ESRD (end stage renal disease) ([place])    On dialysis as of 04/2019  . Glaucoma    BILATERAL  . HTN (hypertension), benign   . Hyperlipidemia   . Myocardial infarction ([place]) 2000, 2007  . Neuromuscular disorder ([place])    neck & bilateral hands  . Pneumonia 1990  . Seizures ([place]) 40 years ago   pre eclampsia  . Shortness of breath   . Sleep apnea    uses cpap    Past Surgical History:  Procedure Laterality Date  . ABDOMINAL HYSTERECTOMY    . CAPD INSERTION Left 12/18/2019   Procedure: LAPAROSCOPIC INSERTION CONTINUOUS AMBULATORY PERITONEAL DIALYSIS  (CAPD) CATHETER;  Surgeon: Algernon Huxley, MD;  Location: [facility] ORS;  Service: Vascular;  Laterality: Left;  . CAPD REMOVAL Left 12/18/2019   Procedure: LAPAROSCOPIC REMOVAL CONTINUOUS AMBULATORY PERITONEAL DIALYSIS  (CAPD) CATHETER;  Surgeon: Algernon Huxley, MD;  Location: [facility]  ORS;  Service: Vascular;  Laterality: Left;  . CORONARY ANGIOPLASTY     2 STENTS  . INSERTION OF DIALYSIS CATHETER N/A 08/27/2019   Procedure: INSERTION OF DIALYSIS CATHETER (PD CATH INSERTION);  Surgeon: Algernon Huxley, MD;  Location: [facility] ORS;  Service: Vascular;  Laterality: N/A;  . IR FLUORO GUIDE CV LINE RIGHT  04/06/2019  . IR REMOVAL TUN CV CATH W/O FL  08/24/2019  . IR US GUIDE VASC ACCESS RIGHT  04/06/2019  . LAPAROSCOPIC LYSIS OF ADHESIONS  08/27/2019   Procedure: LAPAROSCOPIC LYSIS OF ADHESIONS;  Surgeon: Algernon Huxley, MD;  Location: [facility] ORS;  Service: Vascular;;  . 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 NECK SURGERY    . ROBOTIC ASSITED PARTIAL NEPHRECTOMY Left 2013  . ROTATOR CUFF REPAIR     right arm    Social History:  reports that she quit smoking about 21 years ago. Her smoking use included cigarettes. She has a 10.00 pack-year smoking history. She has quit using smokeless tobacco.  Her smokeless tobacco use included snuff. She reports previous alcohol use. She reports that she does not use drugs.  Allergies:  Allergies  Allergen Reactions  . Insect Extract Allergy Skin Test Swelling  . Other     Optical meds unknown to pt  . Propine [Dipivefrin] Itching and Other (See Comments)    Makes eyes turn a reddish orange color.  . Travatan [Travoprost] Itching and Other (See Comments)    Makes eyes turn a reddish orange color  . Xalatan [Latanoprost] Itching and Other (See Comments)    Makes eyes turn a reddish orange color.     Medications Prior to Admission  Medication Sig Dispense Refill  . amLODipine (NORVASC) 10 MG tablet Take 1 tablet by mouth every evening.     Marland Kitchen amLODipine (NORVASC) 5 MG tablet Take 5 mg by mouth daily.    . Aromatic Inhalants (DECONGESTANT INHALER IN) Inhale 2 puffs into the lungs daily.    Marland Kitchen aspirin EC 81 MG tablet Take 81 mg by mouth daily.    . B Complex-C (B-COMPLEX WITH VITAMIN C) tablet Take 1 tablet by mouth every morning.     . brimonidine (ALPHAGAN) 0.2 % ophthalmic solution Place 1 drop into both eyes in the morning and at bedtime.    . cloNIDine (CATAPRES) 0.2 MG tablet Take 0.2 mg by mouth 2 (two) times daily.    . Coenzyme Q10 (CO Q 10) 100 MG CAPS Take 1 tablet by mouth at bedtime.    Marland Kitchen ezetimibe-simvastatin (VYTORIN) 10-20 MG tablet Take 1 tablet by mouth at bedtime.    . hydrALAZINE (APRESOLINE) 50 MG tablet Take 50 mg by mouth 3 (three) times daily.    Marland Kitchen HYDROmorphone (DILAUDID) 4 MG tablet Take 4 mg by mouth every 4 (four) hours as needed for severe pain.    Marland Kitchen insulin glargine (LANTUS) 100 UNIT/ML injection Inject 30 Units  into the skin at bedtime.     . isosorbide mononitrate (IMDUR) 30 MG 24 hr tablet Take 30 mg by mouth every morning.    . metoprolol (TOPROL-XL) 100 MG 24 hr tablet Take 100 mg by mouth every morning.    . Multiple Vitamin (MULTIVITAMIN) tablet Take 1 tablet by mouth daily.    . nitroGLYCERIN (NITROSTAT) 0.4 MG SL tablet Place 0.4 mg under the tongue every 5 (five) minutes as needed for chest pain.    Marland Kitchen senna (SENOKOT) 8.6 MG  tablet Take 1 tablet by mouth as needed for constipation.     Marland Kitchen Spirulina 500 MG TABS Take 500 mg by mouth 3 (three) times daily.    . timolol (TIMOPTIC) 0.5 % ophthalmic solution Place 1 drop into both eyes in the morning and at bedtime.    . torsemide (DEMADEX) 20 MG tablet Take 20 mg by mouth daily.    Marland Kitchen VITAMIN D PO Take 1 tablet by mouth daily.    Marland Kitchen HYDROcodone-acetaminophen (NORCO) 5-325 MG tablet Take 1 tablet by mouth every 6 (six) hours as needed for moderate pain. (Patient not taking: No sig reported) 30 tablet 0  . nystatin (MYCOSTATIN) 100000 UNIT/ML suspension Take 5 mLs by mouth as directed. (Patient not taking: No sig reported)    . Probiotic Product (PROBIOTIC DAILY PO) Take 1 tablet by mouth daily. gummy    . ramipril (ALTACE) 10 MG capsule Take 10 mg by mouth at bedtime. (Patient not taking: Reported on 05/25/2020)       Physical Exam: Blood pressure (!) 148/51, pulse 80, temperature 98.2 F (36.8 C), temperature source Oral, resp. rate 17, height 5' (1.524 m), weight 84.9 kg, SpO2 98 %. General: pleasant, WD/WN female who is laying in bed in NAD HEENT: head is normocephalic, atraumatic.  Sclera are noninjected.  PERRL.  Ears and nose without any masses or lesions.  Mouth is pink and moist. Dentition fair Heart: regular, rate, and rhythm. No obvious murmurs.  Palpable radial  pulses bilaterally  Lungs: CTAB, no wheezes, rhonchi, or rales noted.  Respiratory effort nonlabored Abd: Soft, ND, some tenderness around PD catheter without peritonitis. +BS, no  masses, hernias, or organomegaly. PD catheter site clean and dry. No evidence of skin erythema, warmth, fluctuance, induration or drainage around PD catheter.  MS: 2-3+ pitting edema of b/l LE's. Moves all extremities.  Skin: warm and dry with no masses, lesions, or rashes Psych: A&Ox4 with an appropriate affect Neuro: cranial nerves grossly intact, normal speech, though process intact, gait not assessed  Results for orders placed or performed during the hospital encounter of 05/25/20 (from the past 48 hour(s))  Resp Panel by RT-PCR (Flu A&B, Covid) Nasopharyngeal Swab     Status: None   Collection Time: 05/25/20  4:00 PM   Specimen: Nasopharyngeal Swab; Nasopharyngeal(NP) swabs in vial transport medium  Result Value Ref Range   SARS Coronavirus 2 by RT PCR NEGATIVE NEGATIVE    Comment: (NOTE) SARS-CoV-2 target nucleic acids are NOT DETECTED.  The SARS-CoV-2 RNA is generally detectable in upper respiratory specimens during the acute phase of infection. The lowest concentration of SARS-CoV-2 viral copies this assay can detect is 138 copies/mL. A negative result does not preclude SARS-Cov-2 infection and should not be used as the sole basis for treatment or other patient management decisions. A negative result may occur with  improper specimen collection/handling, submission of specimen other than nasopharyngeal swab, presence of viral mutation(s) within the areas targeted by this assay, and inadequate number of viral copies(<138 copies/mL). A negative result must be combined with clinical observations, patient history, and epidemiological information. The expected result is Negative.  Fact Sheet for Patients:  EntrepreneurPulse.com.au  Fact Sheet for Healthcare Providers:  IncredibleEmployment.be  This test is no t yet approved or cleared by the [place] FDA and  has been authorized for detection and/or diagnosis of SARS-CoV-2 by FDA under an  Emergency Use Authorization (EUA). This EUA will remain  in effect (meaning this test can be used) for the  duration of the COVID-19 declaration under Section 564(b)(1) of the Act, 21 U.S.C.section 360bbb-3(b)(1), unless the authorization is terminated  or revoked sooner.       Influenza A by PCR NEGATIVE NEGATIVE   Influenza B by PCR NEGATIVE NEGATIVE    Comment: (NOTE) The Xpert Xpress SARS-CoV-2/FLU/RSV plus assay is intended as an aid in the diagnosis of influenza from Nasopharyngeal swab specimens and should not be used as a sole basis for treatment. Nasal washings and aspirates are unacceptable for Xpert Xpress SARS-CoV-2/FLU/RSV testing.  Fact Sheet for Patients: EntrepreneurPulse.com.au  Fact Sheet for Healthcare Providers: IncredibleEmployment.be  This test is not yet approved or cleared by the [place] FDA and has been authorized for detection and/or diagnosis of SARS-CoV-2 by FDA under an Emergency Use Authorization (EUA). This EUA will remain in effect (meaning this test can be used) for the duration of the COVID-19 declaration under Section 564(b)(1) of the Act, 21 U.S.C. section 360bbb-3(b)(1), unless the authorization is terminated or revoked.  Performed at [facility], [address], [place], [place] [address]   CBC with Differential     Status: Abnormal   Collection Time: 05/25/20  6:35 PM  Result Value Ref Range   WBC 14.7 (H) 4.0 - 10.5 K/uL   RBC 2.95 (L) 3.87 - 5.11 MIL/uL   Hemoglobin 8.9 (L) 12.0 - 15.0 g/dL   HCT 28.7 (L) 36.0 - 46.0 %   MCV 97.3 80.0 - 100.0 fL   MCH 30.2 26.0 - 34.0 pg   MCHC 31.0 30.0 - 36.0 g/dL   RDW 15.1 11.5 - 15.5 %   Platelets 427 (H) 150 - 400 K/uL   nRBC 0.0 0.0 - 0.2 %   Neutrophils Relative % 81 %   Neutro Abs 11.9 (H) 1.7 - 7.7 K/uL   Lymphocytes Relative 15 %   Lymphs Abs 2.2 0.7 - 4.0 K/uL   Monocytes Relative 3 %   Monocytes Absolute 0.4 0.1 - 1.0 K/uL   Eosinophils  Relative 0 %   Eosinophils Absolute 0.0 0.0 - 0.5 K/uL   Basophils Relative 0 %   Basophils Absolute 0.0 0.0 - 0.1 K/uL   Immature Granulocytes 1 %   Abs Immature Granulocytes 0.09 (H) 0.00 - 0.07 K/uL    Comment: Performed at [facility], [address] [place], [place] [address]  Comprehensive metabolic panel     Status: Abnormal   Collection Time: 05/25/20  6:35 PM  Result Value Ref Range   Sodium 137 135 - 145 mmol/L   Potassium 3.7 3.5 - 5.1 mmol/L   Chloride 97 (L) 98 - 111 mmol/L   CO2 29 22 - 32 mmol/L   Glucose, Bld 63 (L) 70 - 99 mg/dL    Comment: Glucose reference range applies only to samples taken after fasting for at least 8 hours.   BUN 65 (H) 8 - 23 mg/dL   Creatinine, Ser 9.28 (H) 0.44 - 1.00 mg/dL   Calcium 8.2 (L) 8.9 - 10.3 mg/dL   Total Protein 5.7 (L) 6.5 - 8.1 g/dL   Albumin 1.8 (L) 3.5 - 5.0 g/dL   AST 19 15 - 41 U/L   ALT 17 0 - 44 U/L   Alkaline Phosphatase 46 38 - 126 U/L   Total Bilirubin 0.2 (L) 0.3 - 1.2 mg/dL   GFR, Estimated 4 (L) >60 mL/min    Comment: (NOTE) Calculated using the CKD-EPI Creatinine Equation (2021)    Anion gap 11 5 - 15  Comment: Performed at [facility], [address], [place], [place] [address]  Lipase, blood     Status: None   Collection Time: 05/25/20  6:35 PM  Result Value Ref Range   Lipase 18 11 - 51 U/L    Comment: Performed at [facility], [address], [place], [place] [address]  Blood culture (routine x 2)     Status: None (Preliminary result)   Collection Time: 05/25/20  6:35 PM   Specimen: BLOOD  Result Value Ref Range   Specimen Description BLOOD RIGHT ANTECUBITAL    Special Requests      BOTTLES DRAWN AEROBIC AND ANAEROBIC Blood Culture adequate volume   Culture      NO GROWTH < 24 HOURS Performed at [facility], [address] [place], [place] [address]    Report Status PENDING   CBG monitoring, ED     Status: None   Collection Time: 05/26/20  1:13 AM  Result Value Ref Range   Glucose-Capillary  99 70 - 99 mg/dL    Comment: Glucose reference range applies only to samples taken after fasting for at least 8 hours.  Body fluid cell count with differential     Status: Abnormal   Collection Time: 05/26/20  2:30 AM  Result Value Ref Range   Fluid Type-FCT PERITONEAL     Comment: FLUID CORRECTED ON 01/20 AT 0316: PREVIOUSLY REPORTED AS Peritoneal    Color, Fluid STRAW (A) YELLOW   Appearance, Fluid CLOUDY (A) CLEAR   Total Nucleated Cell Count, Fluid 500 0 - 1,000 cu mm   Neutrophil Count, Fluid 73 (H) 0 - 25 %   Lymphs, Fluid 18 %   Monocyte-Macrophage-Serous Fluid 9 (L) 50 - 90 %   Eos, Fluid 0 %    Comment: Performed at [facility], [place] [address], [place], [place] [address]  Body fluid culture     Status: None (Preliminary result)   Collection Time: 05/26/20  2:30 AM   Specimen: Body Fluid  Result Value Ref Range   Specimen Description FLUID PDFL    Special Requests NONE    Gram Stain      ABUNDANT WBC PRESENT, PREDOMINANTLY PMN NO ORGANISMS SEEN Performed at [facility] [address], [place], [place] [address]    Culture PENDING    Report Status PENDING   Hemoglobin A1c     Status: Abnormal   Collection Time: 05/26/20  3:21 AM  Result Value Ref Range   Hgb A1c MFr Bld 7.1 (H) 4.8 - 5.6 %    Comment: (NOTE) Pre diabetes:          5.7%-6.4%  Diabetes:              >6.4%  Glycemic control for   <7.0% adults with diabetes    Mean Plasma Glucose 157.07 mg/dL    Comment: Performed at [facility] [address], [place], [place] [address]  HIV Antibody (routine testing w rflx)     Status: None   Collection Time: 05/26/20  3:21 AM  Result Value Ref Range   HIV Screen 4th Generation wRfx Non Reactive Non Reactive    Comment: Performed at [facility], [place] [address], [place], [place] [address]  Comprehensive metabolic panel     Status: Abnormal   Collection Time: 05/26/20  3:21 AM  Result Value Ref Range   Sodium 137 135 - 145 mmol/L    Potassium 3.6 3.5 - 5.1  mmol/L   Chloride 99 98 - 111 mmol/L   CO2 26 22 - 32 mmol/L   Glucose, Bld 135 (H) 70 - 99 mg/dL    Comment: Glucose reference range applies only to samples taken after fasting for at least 8 hours.   BUN 64 (H) 8 - 23 mg/dL   Creatinine, Ser 9.62 (H) 0.44 - 1.00 mg/dL   Calcium 7.8 (L) 8.9 - 10.3 mg/dL   Total Protein 4.7 (L) 6.5 - 8.1 g/dL   Albumin 1.4 (L) 3.5 - 5.0 g/dL   AST 16 15 - 41 U/L   ALT 15 0 - 44 U/L   Alkaline Phosphatase 41 38 - 126 U/L   Total Bilirubin 0.5 0.3 - 1.2 mg/dL   GFR, Estimated 4 (L) >60 mL/min    Comment: (NOTE) Calculated using the CKD-EPI Creatinine Equation (2021)    Anion gap 12 5 - 15    Comment: Performed at [facility], [place] [address], [place], [place] [address]  CBC     Status: Abnormal   Collection Time: 05/26/20  3:21 AM  Result Value Ref Range   WBC 10.6 (H) 4.0 - 10.5 K/uL   RBC 2.47 (L) 3.87 - 5.11 MIL/uL   Hemoglobin 7.6 (L) 12.0 - 15.0 g/dL   HCT 23.7 (L) 36.0 - 46.0 %   MCV 96.0 80.0 - 100.0 fL   MCH 30.8 26.0 - 34.0 pg   MCHC 32.1 30.0 - 36.0 g/dL   RDW 15.1 11.5 - 15.5 %   Platelets 340 150 - 400 K/uL   nRBC 0.0 0.0 - 0.2 %    Comment: Performed at [facility], [place] [address] [place], [place] [address]  Lactic acid, plasma     Status: None   Collection Time: 05/26/20  3:21 AM  Result Value Ref Range   Lactic Acid, Venous 0.9 0.5 - 1.9 mmol/L    Comment: Performed at [facility] [address] [place], [place] [address]  Glucose, capillary     Status: Abnormal   Collection Time: 05/26/20  6:37 AM  Result Value Ref Range   Glucose-Capillary 142 (H) 70 - 99 mg/dL    Comment: Glucose reference range applies only to samples taken after fasting for at least 8 hours.  MRSA PCR Screening     Status: None   Collection Time: 05/26/20 10:23 AM   Specimen: Nasopharyngeal  Result Value Ref Range   MRSA by PCR NEGATIVE NEGATIVE    Comment:        The GeneXpert MRSA Assay (FDA approved  for NASAL specimens only), is one component of a comprehensive MRSA colonization surveillance program. It is not intended to diagnose MRSA infection nor to guide or monitor treatment for MRSA infections. Performed at [facility], [place] [address] [place], [place] [address]   Glucose, capillary     Status: Abnormal   Collection Time: 05/26/20 12:21 PM  Result Value Ref Range   Glucose-Capillary 160 (H) 70 - 99 mg/dL    Comment: Glucose reference range applies only to samples taken after fasting for at least 8 hours.   CT ABDOMEN PELVIS WO CONTRAST  Result Date: 05/25/2020 CLINICAL DATA:  Bleeding during peritoneal hemodialysis. Lower abdominal pain. EXAM: CT ABDOMEN AND PELVIS WITHOUT CONTRAST TECHNIQUE: Multidetector CT imaging of the abdomen and pelvis was performed following the standard protocol without IV contrast. COMPARISON:  09/14/2016 FINDINGS: Lower chest: Circumflex and right coronary artery atherosclerotic calcification mild  cardiomegaly. C bandlike densities in the lingula, lower lobes, and right middle lobe some of which likely represents scarring but a component may reflect atelectasis. Hepatobiliary: Small layering gallstones in the gallbladder is shown on image 28 series 2. Mildly prominent gallbladder caliber. Pancreas: Unremarkable Spleen: Unremarkable Adrenals/Urinary Tract: 1.1 by 1.5 cm nonspecific nodule of the medial limb of the left adrenal gland, unchanged from 2018 hence likely benign. Various renal lesions are present, some of which are complex bilaterally a left kidney lower pole exophytic mildly hyperdense lesion measures 2.6 cm transverse, previously 1.8 cm transverse. Strictly speaking, renal masses are not excluded. No nephrolithiasis identified. Stomach/Bowel: Mild wall thickening in the ascending colon, potentially partially from nondistention and fatty deposition in the colon wall, although a component of colitis is difficult to exclude. Appendix  unremarkable. No dilated small bowel identified. Vascular/Lymphatic: Aortoiliac atherosclerotic vascular disease. No pathologic adenopathy identified. Reproductive: Uterus absent. Adnexa unremarkable. Other: Notable subcutaneous and flank edema. Small amount of ascites in the left lower quadrant around the peritoneal dialysis catheter looped. Ascites has a density of about 13 Hounsfield units. Mild mesenteric and omental edema along with mild presacral edema. Along the deep subcutaneous portion of the peritoneal dialysis catheter, there is an indistinctly marginated 2.7 by 3.4 by 3.1 cm collection of fluid which could be abscess or hematoma, as shown on image 36 of series 5. Surrounding mildly confluent inflammatory stranding in this vicinity. Musculoskeletal: Thoracic and lumbar spondylosis. IMPRESSION: 1. Along the deep subcutaneous portion of the peritoneal dialysis catheter, there is an indistinctly marginated 2.7 by 3.4 by 3.1 cm collection of fluid which could be abscess or hematoma. Surrounding mildly confluent inflammatory stranding in this vicinity. 2. Small amount of ascites in the left lower quadrant around the peritoneal dialysis catheter looped. 3. Mild wall thickening in the ascending colon, potentially partially from nondistention and fatty deposition in the colon wall, although a component of colitis is difficult to exclude. 4. Various renal lesions are present, some of which are complex bilaterally. Strictly speaking, renal masses are not excluded. 5. Other imaging findings of potential clinical significance: Coronary atherosclerosis with mild cardiomegaly. Cholelithiasis. Stable nonspecific 1.1 by 1.5 cm nodule of the medial limb of the left adrenal gland, unchanged from 2018 hence likely benign. C bandlike densities in the lingula, lower lobes, and right middle lobe some of which likely represents scarring but a component may reflect atelectasis. 6. Aortic atherosclerosis. Aortic Atherosclerosis  (ICD10-I70.0). Electronically Signed   By: Van Clines M.D.   On: 05/25/2020 16:43   Anti-infectives (From admission, onward)   Start     Dose/Rate Route Frequency Ordered Stop   05/26/20 1800  ceFEPIme (MAXIPIME) 1 g in sodium chloride 0.9 % 100 mL IVPB        1 g 200 mL/hr over 30 Minutes Intravenous Every 24 hours 05/25/20 2341     05/25/20 2345  vancomycin (VANCOREADY) IVPB 750 mg/150 mL        750 mg 150 mL/hr over 60 Minutes Intravenous STAT 05/25/20 2341 05/26/20 2345   05/25/20 2343  vancomycin variable dose per unstable renal function (pharmacist dosing)         Does not apply See admin instructions 05/25/20 2344     05/25/20 1700  vancomycin (VANCOCIN) IVPB 1000 mg/200 mL premix        1,000 mg 200 mL/hr over 60 Minutes Intravenous  Once 05/25/20 1656 05/25/20 2254   05/25/20 1700  cefTAZidime (FORTAZ) 2 g in sodium chloride 0.9 %  100 mL IVPB        2 g 200 mL/hr over 30 Minutes Intravenous  Once 05/25/20 1656 05/25/20 2025       Assessment/Plan HTN HLD CAD DM2   Hx ESRD on PD 2.7 x 3.4 x 3.1 fluid collection along deep subcutaneous portion of the PD catheter  - PD catheter was originally inserted by Dr. Lucky Cowboy over at [facility] on 12/18/2019.  - Patient is already on abx per Nephrology for possible PD peritonitis. Cont IV abx  - The is no cellulitis on exam. No palpable fluctuance/abscess. No indication for emergency surgery at this time. - Will ask IR to eval and see if they can aspirate this fluid collection to determine if this is hematoma vs abscess - We will follow along with you  Jillyn Ledger, [facility] 05/26/2020, 2:32 PM Please see Amion for pager number during day hours 7:00am-4:30pm

## 2020-05-26 NOTE — Progress Notes (Signed)
PT Cancellation Note  Patient Details Name: [name] MRN: [id number] DOB: 01/03/1951   Cancelled Treatment:    Reason Eval/Treat Not Completed: Patient at procedure or test/unavailable Patient off unit in ultrasound. PT will re-attempt evaluation as time allows.   Aysiah Jurado A. Gilford Rile PT, DPT Acute Rehabilitation Services Pager [phone] Office [phone]    [name] 05/26/2020, 4:09 PM

## 2020-05-26 NOTE — Consult Note (Signed)
[name]  HISTORY AND PHYSICAL  [name] is an 70 y.o. female.    Chief Complaint: Abdominal pain with red dialysate fluid HPI: [name] is a 70 year old woman PMHx of kidney cancer s/p partial nephrectomy of left kidney living with HTN, HLD, CAD s/p stent, ESRD on peritoneal dialysis for approximately a year who presented to [facility] for evaluation of abdominal pain and red dialysate fluid. CT abdomen pelvis showed Patient says she does makes urine. Patient endorsed chronic bilateral neuropathy and recently had a shingles infection, but no current rashes. Patient denies any current headache, fever/chills, sob, chest pain,diarrhea ,constipation, or focal weakness.      PMH: Past Medical History:  Diagnosis Date  . Arthritis   . CAD (coronary artery disease)    Cath 2008 total RCA with collaterals  . Cancer ([place])    kidney  . Chronic kidney disease 08/2011   kidney cancer  . Diabetes mellitus   . ESRD (end stage renal disease) ([place])    On dialysis as of 04/2019  . Glaucoma    BILATERAL  . HTN (hypertension), benign   . Hyperlipidemia   . Myocardial infarction ([place]) 2000, 2007  . Neuromuscular disorder ([place])    neck & bilateral hands  . Pneumonia 1990  . Seizures ([place]) 40 years ago   pre eclampsia  . Shortness of breath   . Sleep apnea    uses cpap   PSH: Past Surgical History:  Procedure Laterality Date  . ABDOMINAL HYSTERECTOMY    . CAPD INSERTION Left 12/18/2019   Procedure: LAPAROSCOPIC INSERTION CONTINUOUS AMBULATORY PERITONEAL DIALYSIS  (CAPD) CATHETER;  Surgeon: Algernon Huxley, MD;  Location: [facility] ORS;  Service: Vascular;  Laterality: Left;  . CAPD REMOVAL Left 12/18/2019   Procedure: LAPAROSCOPIC REMOVAL CONTINUOUS AMBULATORY PERITONEAL DIALYSIS  (CAPD) CATHETER;  Surgeon: Algernon Huxley, MD;  Location: [facility] ORS;  Service: Vascular;  Laterality: Left;  . CORONARY ANGIOPLASTY     2 STENTS  . INSERTION OF DIALYSIS CATHETER N/A  08/27/2019   Procedure: INSERTION OF DIALYSIS CATHETER (PD CATH INSERTION);  Surgeon: Algernon Huxley, MD;  Location: [facility] ORS;  Service: Vascular;  Laterality: N/A;  . IR FLUORO GUIDE CV LINE RIGHT  04/06/2019  . IR REMOVAL TUN CV CATH W/O FL  08/24/2019  . IR US GUIDE VASC ACCESS RIGHT  04/06/2019  . LAPAROSCOPIC LYSIS OF ADHESIONS  08/27/2019   Procedure: LAPAROSCOPIC LYSIS OF ADHESIONS;  Surgeon: Algernon Huxley, MD;  Location: [facility] ORS;  Service: Vascular;;  . 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 NECK SURGERY    . ROBOTIC ASSITED PARTIAL NEPHRECTOMY Left 2013  . ROTATOR CUFF REPAIR     right arm    Past Medical History:  Diagnosis Date  . Arthritis   . CAD (coronary artery disease)    Cath 2008 total RCA with collaterals  . Cancer ([place])    kidney  . Chronic kidney disease 08/2011   kidney cancer  . Diabetes mellitus   . ESRD (end stage renal disease) ([place])    On dialysis as of 04/2019  . Glaucoma    BILATERAL  . HTN (hypertension), benign   . Hyperlipidemia   . Myocardial infarction ([place]) 2000, 2007  . Neuromuscular disorder ([place])    neck & bilateral hands  . Pneumonia 1990  . Seizures ([place]) 40 years ago   pre eclampsia  . Shortness of breath   .  Sleep apnea    uses cpap    Medications:  I have reviewed the patient's current medications.  Medications Prior to Admission  Medication Sig Dispense Refill  . amLODipine (NORVASC) 10 MG tablet Take 1 tablet by mouth every evening.     Marland Kitchen amLODipine (NORVASC) 5 MG tablet Take 5 mg by mouth daily.    . Aromatic Inhalants (DECONGESTANT INHALER IN) Inhale 2 puffs into the lungs daily.    Marland Kitchen aspirin EC 81 MG tablet Take 81 mg by mouth daily.    . B Complex-C (B-COMPLEX WITH VITAMIN C) tablet Take 1 tablet by mouth every morning.     . brimonidine (ALPHAGAN) 0.2 % ophthalmic solution Place 1 drop into both eyes in the morning and at bedtime.    . cloNIDine (CATAPRES) 0.2 MG tablet Take 0.2 mg by  mouth 2 (two) times daily.    . Coenzyme Q10 (CO Q 10) 100 MG CAPS Take 1 tablet by mouth at bedtime.    Marland Kitchen ezetimibe-simvastatin (VYTORIN) 10-20 MG tablet Take 1 tablet by mouth at bedtime.    . hydrALAZINE (APRESOLINE) 50 MG tablet Take 50 mg by mouth 3 (three) times daily.    Marland Kitchen HYDROmorphone (DILAUDID) 4 MG tablet Take 4 mg by mouth every 4 (four) hours as needed for severe pain.    Marland Kitchen insulin glargine (LANTUS) 100 UNIT/ML injection Inject 30 Units into the skin at bedtime.     . isosorbide mononitrate (IMDUR) 30 MG 24 hr tablet Take 30 mg by mouth every morning.    . metoprolol (TOPROL-XL) 100 MG 24 hr tablet Take 100 mg by mouth every morning.    . Multiple Vitamin (MULTIVITAMIN) tablet Take 1 tablet by mouth daily.    . nitroGLYCERIN (NITROSTAT) 0.4 MG SL tablet Place 0.4 mg under the tongue every 5 (five) minutes as needed for chest pain.    Marland Kitchen senna (SENOKOT) 8.6 MG tablet Take 1 tablet by mouth as needed for constipation.     Marland Kitchen Spirulina 500 MG TABS Take 500 mg by mouth 3 (three) times daily.    . timolol (TIMOPTIC) 0.5 % ophthalmic solution Place 1 drop into both eyes in the morning and at bedtime.    . torsemide (DEMADEX) 20 MG tablet Take 20 mg by mouth daily.    Marland Kitchen VITAMIN D PO Take 1 tablet by mouth daily.    Marland Kitchen HYDROcodone-acetaminophen (NORCO) 5-325 MG tablet Take 1 tablet by mouth every 6 (six) hours as needed for moderate pain. (Patient not taking: No sig reported) 30 tablet 0  . nystatin (MYCOSTATIN) 100000 UNIT/ML suspension Take 5 mLs by mouth as directed. (Patient not taking: No sig reported)    . Probiotic Product (PROBIOTIC DAILY PO) Take 1 tablet by mouth daily. gummy    . ramipril (ALTACE) 10 MG capsule Take 10 mg by mouth at bedtime. (Patient not taking: Reported on 05/25/2020)      ALLERGIES:   Allergies  Allergen Reactions  . Insect Extract Allergy Skin Test Swelling  . Other     Optical meds unknown to pt  . Propine [Dipivefrin] Itching and Other (See Comments)     Makes eyes turn a reddish orange color.  . Travatan [Travoprost] Itching and Other (See Comments)    Makes eyes turn a reddish orange color  . Xalatan [Latanoprost] Itching and Other (See Comments)    Makes eyes turn a reddish orange color.     FAM HX: Family History  Problem Relation Age of  Onset  . Heart attack Father     Social History:   reports that she quit smoking about 21 years ago. Her smoking use included cigarettes. She has a 10.00 pack-year smoking history. She has quit using smokeless tobacco.  Her smokeless tobacco use included snuff. She reports previous alcohol use. She reports that she does not use drugs.  ROS: Complete ROS negative unless mentioned in HPI.   Blood pressure (!) 148/51, pulse 80, temperature 98.2 F (36.8 C), temperature source Oral, resp. rate 17, height 5' (1.524 m), weight 84.9 kg, SpO2 98 %. PHYSICAL EXAM:  General: NAD, obese women HE: Normocephalic, atraumatic , EOMI, Conjunctivae normal ENT: No congestion, no rhinorrhea, no exudate or erythema  Cardiovascular: Normal rate, regular rhythm.  No murmurs, rubs, or gallops Pulmonary : Effort normal, breath sounds normal. No wheezes, rales, or rhonchi Abdominal: tender to gentle palpation in RLQ and LLQ , more tender in LLQ at catheter site  Musculoskeletal: 2+ pitting edema to thghs , no deformity Skin: Warm, dry  Psychiatric/Behavioral:  normal mood, normal behavior     Results for orders placed or performed during the hospital encounter of 05/25/20 (from the past 48 hour(s))  Resp Panel by RT-PCR (Flu A&B, Covid) Nasopharyngeal Swab     Status: None   Collection Time: 05/25/20  4:00 PM   Specimen: Nasopharyngeal Swab; Nasopharyngeal(NP) swabs in vial transport medium  Result Value Ref Range   SARS Coronavirus 2 by RT PCR NEGATIVE NEGATIVE    Comment: (NOTE) SARS-CoV-2 target nucleic acids are NOT DETECTED.  The SARS-CoV-2 RNA is generally detectable in upper respiratory specimens during  the acute phase of infection. The lowest concentration of SARS-CoV-2 viral copies this assay can detect is 138 copies/mL. A negative result does not preclude SARS-Cov-2 infection and should not be used as the sole basis for treatment or other patient management decisions. A negative result may occur with  improper specimen collection/handling, submission of specimen other than nasopharyngeal swab, presence of viral mutation(s) within the areas targeted by this assay, and inadequate number of viral copies(<138 copies/mL). A negative result must be combined with clinical observations, patient history, and epidemiological information. The expected result is Negative.  Fact Sheet for Patients:  EntrepreneurPulse.com.au  Fact Sheet for Healthcare Providers:  IncredibleEmployment.be  This test is no t yet approved or cleared by the [place] FDA and  has been authorized for detection and/or diagnosis of SARS-CoV-2 by FDA under an Emergency Use Authorization (EUA). This EUA will remain  in effect (meaning this test can be used) for the duration of the COVID-19 declaration under Section 564(b)(1) of the Act, 21 U.S.C.section 360bbb-3(b)(1), unless the authorization is terminated  or revoked sooner.       Influenza A by PCR NEGATIVE NEGATIVE   Influenza B by PCR NEGATIVE NEGATIVE    Comment: (NOTE) The Xpert Xpress SARS-CoV-2/FLU/RSV plus assay is intended as an aid in the diagnosis of influenza from Nasopharyngeal swab specimens and should not be used as a sole basis for treatment. Nasal washings and aspirates are unacceptable for Xpert Xpress SARS-CoV-2/FLU/RSV testing.  Fact Sheet for Patients: EntrepreneurPulse.com.au  Fact Sheet for Healthcare Providers: IncredibleEmployment.be  This test is not yet approved or cleared by the [place] FDA and has been authorized for detection and/or diagnosis of  SARS-CoV-2 by FDA under an Emergency Use Authorization (EUA). This EUA will remain in effect (meaning this test can be used) for the duration of the COVID-19 declaration under Section 564(b)(1) of the  Act, 21 U.S.C. section 360bbb-3(b)(1), unless the authorization is terminated or revoked.  Performed at [facility], [address], [place], [place] [address]   CBC with Differential     Status: Abnormal   Collection Time: 05/25/20  6:35 PM  Result Value Ref Range   WBC 14.7 (H) 4.0 - 10.5 K/uL   RBC 2.95 (L) 3.87 - 5.11 MIL/uL   Hemoglobin 8.9 (L) 12.0 - 15.0 g/dL   HCT 28.7 (L) 36.0 - 46.0 %   MCV 97.3 80.0 - 100.0 fL   MCH 30.2 26.0 - 34.0 pg   MCHC 31.0 30.0 - 36.0 g/dL   RDW 15.1 11.5 - 15.5 %   Platelets 427 (H) 150 - 400 K/uL   nRBC 0.0 0.0 - 0.2 %   Neutrophils Relative % 81 %   Neutro Abs 11.9 (H) 1.7 - 7.7 K/uL   Lymphocytes Relative 15 %   Lymphs Abs 2.2 0.7 - 4.0 K/uL   Monocytes Relative 3 %   Monocytes Absolute 0.4 0.1 - 1.0 K/uL   Eosinophils Relative 0 %   Eosinophils Absolute 0.0 0.0 - 0.5 K/uL   Basophils Relative 0 %   Basophils Absolute 0.0 0.0 - 0.1 K/uL   Immature Granulocytes 1 %   Abs Immature Granulocytes 0.09 (H) 0.00 - 0.07 K/uL    Comment: Performed at [facility], [address] [place], [place] [address]  Comprehensive metabolic panel     Status: Abnormal   Collection Time: 05/25/20  6:35 PM  Result Value Ref Range   Sodium 137 135 - 145 mmol/L   Potassium 3.7 3.5 - 5.1 mmol/L   Chloride 97 (L) 98 - 111 mmol/L   CO2 29 22 - 32 mmol/L   Glucose, Bld 63 (L) 70 - 99 mg/dL    Comment: Glucose reference range applies only to samples taken after fasting for at least 8 hours.   BUN 65 (H) 8 - 23 mg/dL   Creatinine, Ser 9.28 (H) 0.44 - 1.00 mg/dL   Calcium 8.2 (L) 8.9 - 10.3 mg/dL   Total Protein 5.7 (L) 6.5 - 8.1 g/dL   Albumin 1.8 (L) 3.5 - 5.0 g/dL   AST 19 15 - 41 U/L   ALT 17 0 - 44 U/L   Alkaline Phosphatase 46 38 - 126 U/L   Total  Bilirubin 0.2 (L) 0.3 - 1.2 mg/dL   GFR, Estimated 4 (L) >60 mL/min    Comment: (NOTE) Calculated using the CKD-EPI Creatinine Equation (2021)    Anion gap 11 5 - 15    Comment: Performed at [facility], [address], [place], [place] [address]  Lipase, blood     Status: None   Collection Time: 05/25/20  6:35 PM  Result Value Ref Range   Lipase 18 11 - 51 U/L    Comment: Performed at [facility], [address] [place], [place] [address]  Blood culture (routine x 2)     Status: None (Preliminary result)   Collection Time: 05/25/20  6:35 PM   Specimen: BLOOD  Result Value Ref Range   Specimen Description BLOOD RIGHT ANTECUBITAL    Special Requests      BOTTLES DRAWN AEROBIC AND ANAEROBIC Blood Culture adequate volume   Culture      NO GROWTH < 24 HOURS Performed at [facility], [address] [place],  [address]    Report Status PENDING   CBG monitoring, ED     Status: None   Collection  Time: 05/26/20  1:13 AM  Result Value Ref Range   Glucose-Capillary 99 70 - 99 mg/dL    Comment: Glucose reference range applies only to samples taken after fasting for at least 8 hours.  Body fluid cell count with differential     Status: Abnormal   Collection Time: 05/26/20  2:30 AM  Result Value Ref Range   Fluid Type-FCT PERITONEAL     Comment: FLUID CORRECTED ON 01/20 AT 0316: PREVIOUSLY REPORTED AS Peritoneal    Color, Fluid STRAW (A) YELLOW   Appearance, Fluid CLOUDY (A) CLEAR   Total Nucleated Cell Count, Fluid 500 0 - 1,000 cu mm   Neutrophil Count, Fluid 73 (H) 0 - 25 %   Lymphs, Fluid 18 %   Monocyte-Macrophage-Serous Fluid 9 (L) 50 - 90 %   Eos, Fluid 0 %    Comment: Performed at [facility], [place] [address], [place], [place] [address]  Body fluid culture     Status: None (Preliminary result)   Collection Time: 05/26/20  2:30 AM   Specimen: Body Fluid  Result Value Ref Range   Specimen Description FLUID PDFL    Special Requests NONE    Gram Stain       ABUNDANT WBC PRESENT, PREDOMINANTLY PMN NO ORGANISMS SEEN Performed at [facility] [address] [place], [place] [address]    Culture PENDING    Report Status PENDING   Hemoglobin A1c     Status: Abnormal   Collection Time: 05/26/20  3:21 AM  Result Value Ref Range   Hgb A1c MFr Bld 7.1 (H) 4.8 - 5.6 %    Comment: (NOTE) Pre diabetes:          5.7%-6.4%  Diabetes:              >6.4%  Glycemic control for   <7.0% adults with diabetes    Mean Plasma Glucose 157.07 mg/dL    Comment: Performed at [facility] [address], [place], [place] [address]  Comprehensive metabolic panel     Status: Abnormal   Collection Time: 05/26/20  3:21 AM  Result Value Ref Range   Sodium 137 135 - 145 mmol/L   Potassium 3.6 3.5 - 5.1 mmol/L   Chloride 99 98 - 111 mmol/L   CO2 26 22 - 32 mmol/L   Glucose, Bld 135 (H) 70 - 99 mg/dL    Comment: Glucose reference range applies only to samples taken after fasting for at least 8 hours.   BUN 64 (H) 8 - 23 mg/dL   Creatinine, Ser 9.62 (H) 0.44 - 1.00 mg/dL   Calcium 7.8 (L) 8.9 - 10.3 mg/dL   Total Protein 4.7 (L) 6.5 - 8.1 g/dL   Albumin 1.4 (L) 3.5 - 5.0 g/dL   AST 16 15 - 41 U/L   ALT 15 0 - 44 U/L   Alkaline Phosphatase 41 38 - 126 U/L   Total Bilirubin 0.5 0.3 - 1.2 mg/dL   GFR, Estimated 4 (L) >60 mL/min    Comment: (NOTE) Calculated using the CKD-EPI Creatinine Equation (2021)    Anion gap 12 5 - 15    Comment: Performed at [facility], [place] [address] [place] [address]  CBC     Status: Abnormal   Collection Time: 05/26/20  3:21 AM  Result Value Ref Range   WBC 10.6 (H) 4.0 - 10.5 K/uL   RBC 2.47 (L) 3.87 - 5.11 MIL/uL  Hemoglobin 7.6 (L) 12.0 - 15.0 g/dL   HCT 23.7 (L) 36.0 - 46.0 %   MCV 96.0 80.0 - 100.0 fL   MCH 30.8 26.0 - 34.0 pg   MCHC 32.1 30.0 - 36.0 g/dL   RDW 15.1 11.5 - 15.5 %   Platelets 340 150 - 400 K/uL   nRBC 0.0 0.0 - 0.2 %    Comment: Performed at [facility] [address] [place], [place] [address]  Glucose, capillary     Status: Abnormal   Collection Time: 05/26/20  6:37 AM  Result Value Ref Range   Glucose-Capillary 142 (H) 70 - 99 mg/dL    Comment: Glucose reference range applies only to samples taken after fasting for at least 8 hours.    CT ABDOMEN PELVIS WO CONTRAST  Result Date: 05/25/2020 CLINICAL DATA:  Bleeding during peritoneal hemodialysis. Lower abdominal pain. EXAM: CT ABDOMEN AND PELVIS WITHOUT CONTRAST TECHNIQUE: Multidetector CT imaging of the abdomen and pelvis was performed following the standard protocol without IV contrast. COMPARISON:  09/14/2016 FINDINGS: Lower chest: Circumflex and right coronary artery atherosclerotic calcification mild cardiomegaly. C bandlike densities in the lingula, lower lobes, and right middle lobe some of which likely represents scarring but a component may reflect atelectasis. Hepatobiliary: Small layering gallstones in the gallbladder is shown on image 28 series 2. Mildly prominent gallbladder caliber. Pancreas: Unremarkable Spleen: Unremarkable Adrenals/Urinary Tract: 1.1 by 1.5 cm nonspecific nodule of the medial limb of the left adrenal gland, unchanged from 2018 hence likely benign. Various renal lesions are present, some of which are complex bilaterally a left kidney lower pole exophytic mildly hyperdense lesion measures 2.6 cm transverse, previously 1.8 cm transverse. Strictly speaking, renal masses are not excluded. No nephrolithiasis identified. Stomach/Bowel: Mild wall thickening in the ascending colon, potentially partially from nondistention and fatty deposition in the colon wall, although a component of colitis is difficult to exclude. Appendix unremarkable. No dilated small bowel identified. Vascular/Lymphatic: Aortoiliac atherosclerotic vascular disease. No pathologic adenopathy identified. Reproductive: Uterus absent. Adnexa unremarkable. Other: Notable subcutaneous and flank edema. Small amount of  ascites in the left lower quadrant around the peritoneal dialysis catheter looped. Ascites has a density of about 13 Hounsfield units. Mild mesenteric and omental edema along with mild presacral edema. Along the deep subcutaneous portion of the peritoneal dialysis catheter, there is an indistinctly marginated 2.7 by 3.4 by 3.1 cm collection of fluid which could be abscess or hematoma, as shown on image 36 of series 5. Surrounding mildly confluent inflammatory stranding in this vicinity. Musculoskeletal: Thoracic and lumbar spondylosis. IMPRESSION: 1. Along the deep subcutaneous portion of the peritoneal dialysis catheter, there is an indistinctly marginated 2.7 by 3.4 by 3.1 cm collection of fluid which could be abscess or hematoma. Surrounding mildly confluent inflammatory stranding in this vicinity. 2. Small amount of ascites in the left lower quadrant around the peritoneal dialysis catheter looped. 3. Mild wall thickening in the ascending colon, potentially partially from nondistention and fatty deposition in the colon wall, although a component of colitis is difficult to exclude. 4. Various renal lesions are present, some of which are complex bilaterally. Strictly speaking, renal masses are not excluded. 5. Other imaging findings of potential clinical significance: Coronary atherosclerosis with mild cardiomegaly. Cholelithiasis. Stable nonspecific 1.1 by 1.5 cm nodule of the medial limb of the left adrenal gland, unchanged from 2018 hence likely benign. C bandlike densities in the lingula, lower lobes, and right middle lobe some of which likely represents scarring but  a component may reflect atelectasis. 6. Aortic atherosclerosis. Aortic Atherosclerosis (ICD10-I70.0). Electronically Signed   By: Van Clines M.D.   On: 05/25/2020 16:43    Assessment/Plan  Peritonitis, associated with peritoneal dialysis Notable labs: Peritoneal fluid  TNC 500 cu/mm, neutrophil predominance, no organisms on culture.  Abx started prior to drawing cultures. K 3.6, Bun 64 - Previous infection in November treated by her nephrologist with IV and PO antibiotics - No fungal coverage at this time.  - Cefepime and Vancomycin per pharmacy   - 2.7 by 3.4 by 3.1 cm collection of fluid which could be abscess or hematoma , see CT Abd Pelvis Wo contrast. Hematoma vs abscess. - Appreciate general surgery consult for evaluation of hematoma vs abscess.   ESRD on peritoneal dialysis  - Follow with Dr.Bhutani and sometimes Dr.Lateef. [place] in [place]  - Volume overloaded on exam - Dr.Sanford to place orders for peritoneal dialysis   HTN - at hospital goal - on Toprol Xl,amlodopine, hydralazine and clonidine   Anemia of CKD Hgb 8.9>7.6, likely dilutional, hemodynamically stable. No signs of frank bleeding.   Lorene Dy  PGY2 IM 05/26/2020, 9:53 AM

## 2020-05-26 NOTE — Progress Notes (Signed)
Aspiration by IR appreciated.  This revealed old blood products more c/w hematoma.  At this time so urgent findings to suggest she needs her PD catheter removed.  This small sample was sent for culture.  If this returns positive you could call back or if otherwise doing well have her follow up with [facility] surgeon who placed PD catheter to discuss elective removal.  We will sign off at this time.  [name] 4:25 PM 05/26/2020

## 2020-05-26 NOTE — Plan of Care (Signed)
  Problem: Health Behavior/Discharge Planning: Goal: Ability to manage health-related needs will improve Outcome: Progressing   Problem: Activity: Goal: Risk for activity intolerance will decrease Outcome: Progressing   Problem: Coping: Goal: Level of anxiety will decrease Outcome: Progressing   

## 2020-05-27 DIAGNOSIS — T8571XA Infection and inflammatory reaction due to peritoneal dialysis catheter, initial encounter: Secondary | ICD-10-CM | POA: Diagnosis not present

## 2020-05-27 LAB — GLUCOSE, CAPILLARY
Glucose-Capillary: 106 mg/dL — ABNORMAL HIGH (ref 70–99)
Glucose-Capillary: 42 mg/dL — CL (ref 70–99)
Glucose-Capillary: 64 mg/dL — ABNORMAL LOW (ref 70–99)

## 2020-05-27 MED ORDER — DEXTROSE 50 % IV SOLN
INTRAVENOUS | Status: AC
  Filled 2020-05-27: qty 50

## 2020-05-27 MED ORDER — INSULIN GLARGINE 100 UNIT/ML ~~LOC~~ SOLN
15.0000 [IU] | Freq: Every day | SUBCUTANEOUS | Status: DC
  Filled 2020-05-27: qty 0.15

## 2020-05-27 MED ORDER — LORAZEPAM 2 MG/ML IJ SOLN: 0.5000 mg | INTRAMUSCULAR | Status: DC | PRN

## 2020-05-27 MED ORDER — HYDROMORPHONE HCL 1 MG/ML IJ SOLN
1.0000 mg | INTRAMUSCULAR | Status: DC | PRN
  Administered 2020-05-28: 1 mg via INTRAVENOUS
  Filled 2020-05-27: qty 1

## 2020-05-27 NOTE — Progress Notes (Signed)
[facility] Progress Note   Assessment/ Plan:   1. Peritonitis, associated with peritoneal dialysis       ESRD on peritoneal dialysis      - Reports she follows with Dr.Bhutani and sometimes Dr.Lateef @ [facility] in [place].      - Waiting on labs this morning. Patient may be refusing based on my conversation with her this morning. We discussed importance of allowing monitoring.      - She continues to have abdominal pain, she overall feels worse but had unsuccessful peritoneal dialysis last night due to clotting     - Likely she will need HD.   2. Possible hematoma vs Abscess      -CT abdomen pelvis without contrast at [facility] showed 2.7 x 3.4 x 3.1 cm collection of fluid which could be abscess or hematoma     -Greatly appreciate consult by general surgery yesterday     -Evaluation by general surgery suggest hematoma.     -IR consulted and aspirated 1 to 2 cc of old blood products, culture sent (pending )      3. HTN      - stable, on Toprol XL, amlodipine, hydralazine and clonidine   4. Anemia of CKD     - labs pending   See attending attestation for final recommendations   Subjective:   [name] is a 70 y.o. with PMH of kidney cancer s/p partial nephrectomy of left kidney living with HTN, CAD s/p stent, ESRD on peritoneal dialysis transferred from [facility] for  peritonitis. She is on hospital day 2   Patient reports she overall feels bad this morning. She had unsuccessful peritoneal dialysis last night. She has excess volume and feels puffy.   Objective:   BP (!) 126/54 (BP Location: Left Arm)   Pulse 79   Temp 99.2 F (37.3 C) (Oral)   Resp 18   Ht 5' (1.524 m)   Wt 84.9 kg   SpO2 92%   BMI 36.55 kg/m   Physical Exam: Gen: Obese, NAD, laying in bed with multiple blankets CVS:RRR, no murmurs rubs or gallops Resp: Effort normal, breath sounds normal, no rales or rhonchi Abd: Tender to gentle palpation in both lower quadrants,   bowel sounds present Ext: 3-4 + pitting edema in LE up to thigh , warm and well perfused   Labs: BMET Recent Labs  Lab 05/25/20 1835 05/26/20 0321  NA 137 137  K 3.7 3.6  CL 97* 99  CO2 29 26  GLUCOSE 63* 135*  BUN 65* 64*  CREATININE 9.28* 9.62*  CALCIUM 8.2* 7.8*   CBC Recent Labs  Lab 05/25/20 1835 05/26/20 0321  WBC 14.7* 10.6*  NEUTROABS 11.9*  --   HGB 8.9* 7.6*  HCT 28.7* 23.7*  MCV 97.3 96.0  PLT 427* 340      Medications:    . amLODipine  10 mg Oral QPM  . aspirin EC  81 mg Oral Daily  . brimonidine  1 drop Both Eyes TID  . cloNIDine  0.2 mg Oral BID  . ezetimibe  10 mg Oral QHS   And  . simvastatin  20 mg Oral QHS  . gentamicin cream  1 application Topical Daily  . heparin injection (subcutaneous)  5,000 Units Subcutaneous Q8H  . hydrALAZINE  50 mg Oral TID  . insulin aspart  0-6 Units Subcutaneous TID WC  . insulin glargine  30 Units Subcutaneous QHS  . isosorbide mononitrate  30 mg Oral Daily  . metoprolol succinate  100 mg Oral Daily  . timolol  1 drop Both Eyes Daily  . torsemide  20 mg Oral Daily  . vancomycin variable dose per unstable renal function (pharmacist dosing)   Does not apply See admin instructions     Tamsen Snider, MD PGY2 IM 05/27/2020, 7:28 AM

## 2020-05-27 NOTE — Progress Notes (Signed)
Pt CBG,40 gave orange juice, went up to 64, refuse D50 INJ. and wont eat any food or drink anything else. She said , 'no more, don't give me nothing else' , attending physician paged, patient asymptomatic, sleeping comfortably in bed, education provided, will continue to monitor.

## 2020-05-27 NOTE — Progress Notes (Addendum)
PROGRESS NOTE  ELLENI May QPY:[id number] DOB: 1950-10-03 DOA: 05/25/2020 PCP: Lucia Gaskins, MD  HPI/Recap of past 24 hours: [name] is a 70 y.o. female with history of ESRD on peritoneal dialysis has been experiencing fluid return from the peritoneal catheter with increasing abdominal pain mostly in the left lower quadrant over the last 24 hours.  Reports chills.  Denies nausea vomiting or diarrhea.  ED Course: Patient had initially presented to the ER at [facility] [place] abdomen pelvis without contrast showed features concerning for possible hematoma versus abscess in the deep portion of the peritoneal dialysis catheter region.  Nephrology was consulted requested to get cultures and to start IV antibiotics empirically.  Patient was transferred to [facility] for further management.  COVID screening test was negative.  Labs were significant for WBC count of 14.7K hemoglobin of 8.9K potassium was 3.7.   She was seen by nephrology with plan to continue PD using outpatient prescription but at 2.5% dextrose.  She was also seen by general surgery and IR.  Post image guided aspiration of anterior abdominal wall fluid on 05/26/2020 by interventional radiology Dr. Earleen Newport.  This revealed old blood products more consistent with hematoma.    On 05/27/2020 patient made decision for comfort care.  She has requested to stop hemodialysis.  She has declined any further blood draws or antibiotics or treatment to prolong her life.  She is alert and oriented x4.  CODE STATUS changed to DNR at patient's request and comfort care orders placed.  Goal is to focus on comfort.  TOC consulted to assist with hospice placement.  Assessment/Plan: Principal Problem:   Infection associated with peritoneal dialysis catheter ([place]) Active Problems:   Mixed hyperlipidemia   Essential hypertension   Coronary atherosclerosis   DM (diabetes mellitus) ([place])  Assessment: -Presumptive peritonitis in the setting of  peritoneal dialysis -ESRD on PD  -Anemia of chronic disease in the setting of ESRD -Type 2 diabetes with hyperglycemia -Essential hypertension -Hyperlipidemia -Physical debility/ambulatory dysfunction -DNR -Comfort care  Plan: Comfort orders in place. Focus on comfort. TOC assisting with hospice placement.  Code Status: DNR/comfort care.  Family Communication: Updated her daughter via phone on 05/27/2020.  Disposition Plan: Likely will DC to residential hospice.   Consultants:  Nephrology  Palliative care team.  TOC to assist with hospice placement.  Procedures:  PD   Image guided aspiration of anterior abdominal wall fluid on 05/26/2020 by interventional radiology   Antimicrobials:  Cefepime 05/25/2020>> 05/27/2020  IV vancomycin 05/25/2020>> 05/27/2020  Status is: Inpatient   Dispo:  Patient From: Home  Planned Disposition: Hospice  Expected discharge date: 05/28/2020  Medically stable for discharge: Yes, pending hospice placement.        Objective: Vitals:   05/26/20 0500 05/26/20 0826 05/26/20 2019 05/27/20 0900  BP: (!) 145/70 (!) 148/51 (!) 126/54 (!) 155/60  Pulse: 77 80 79 96  Resp: 17 17 18 16   Temp: 98.7 F (37.1 C) 98.2 F (36.8 C) 99.2 F (37.3 C) 100.3 F (37.9 C)  TempSrc: Oral Oral Oral Oral  SpO2: 96% 98% 92% 92%  Weight:      Height:        Intake/Output Summary (Last 24 hours) at 05/27/2020 1103 Last data filed at 05/26/2020 2300 Gross per 24 hour  Intake 534.11 ml  Output --  Net 534.11 ml   Filed Weights   05/25/20 2331  Weight: 84.9 kg    Exam:  . General: 70 y.o. year-old female  well-developed well-nourished in no acute distress.  Alert and oriented x4. . Cardiovascular: Regular rate and rhythm no rubs or gallops. Marland Kitchen Respiratory: Clear to auscultation no wheezes or rales.   . Abdomen: Obese tenderness at the site of the PD catheter with palpation.  Bowel sounds present.   . Musculoskeletal: 3+ pitting edema in  lower extremities bilaterally. . Skin: No ulcerative lesions noted. Marland Kitchen Psychiatry: Mood is appropriate for condition and setting.   Data Reviewed: CBC: Recent Labs  Lab 05/25/20 1835 05/26/20 0321  WBC 14.7* 10.6*  NEUTROABS 11.9*  --   HGB 8.9* 7.6*  HCT 28.7* 23.7*  MCV 97.3 96.0  PLT 427* 381   Basic Metabolic Panel: Recent Labs  Lab 05/25/20 1835 05/26/20 0321  NA 137 137  K 3.7 3.6  CL 97* 99  CO2 29 26  GLUCOSE 63* 135*  BUN 65* 64*  CREATININE 9.28* 9.62*  CALCIUM 8.2* 7.8*   GFR: Estimated Creatinine Clearance: 5.4 mL/min (A) (by C-G formula based on SCr of 9.62 mg/dL (H)). Liver Function Tests: Recent Labs  Lab 05/25/20 1835 05/26/20 0321  AST 19 16  ALT 17 15  ALKPHOS 46 41  BILITOT 0.2* 0.5  PROT 5.7* 4.7*  ALBUMIN 1.8* 1.4*   Recent Labs  Lab 05/25/20 1835  LIPASE 18   No results for input(s): AMMONIA in the last 168 hours. Coagulation Profile: No results for input(s): INR, PROTIME in the last 168 hours. Cardiac Enzymes: No results for input(s): CKTOTAL, CKMB, CKMBINDEX, TROPONINI in the last 168 hours. BNP (last 3 results) No results for input(s): PROBNP in the last 8760 hours. HbA1C: Recent Labs    05/26/20 0321  HGBA1C 7.1*   CBG: Recent Labs  Lab 05/26/20 0637 05/26/20 1221 05/26/20 1725 05/26/20 2018 05/27/20 0642  GLUCAP 142* 160* 163* 181* 106*   Lipid Profile: No results for input(s): CHOL, HDL, LDLCALC, TRIG, CHOLHDL, LDLDIRECT in the last 72 hours. Thyroid Function Tests: No results for input(s): TSH, T4TOTAL, FREET4, T3FREE, THYROIDAB in the last 72 hours. Anemia Panel: No results for input(s): VITAMINB12, FOLATE, FERRITIN, TIBC, IRON, RETICCTPCT in the last 72 hours. Urine analysis:    Component Value Date/Time   COLORURINE YELLOW 01/03/2007 1041   APPEARANCEUR CLEAR 01/03/2007 1041   LABSPEC 1.015 01/03/2007 1041   PHURINE 6.5 01/03/2007 1041   GLUCOSEU NEGATIVE 01/03/2007 [place] 01/03/2007  [place] 01/03/2007 [place] 01/03/2007 1041   PROTEINUR NEGATIVE 01/03/2007 1041   UROBILINOGEN 0.2 01/03/2007 1041   NITRITE NEGATIVE 01/03/2007 1041   LEUKOCYTESUR  01/03/2007 1041    NEGATIVE MICROSCOPIC NOT DONE ON URINES WITH NEGATIVE PROTEIN, BLOOD, LEUKOCYTES, NITRITE, OR GLUCOSE <1000 mg/dL.   Sepsis Labs: @LABRCNTIP (procalcitonin:4,lacticidven:4)  ) Recent Results (from the past 240 hour(s))  Resp Panel by RT-PCR (Flu A&B, Covid) Nasopharyngeal Swab     Status: None   Collection Time: 05/25/20  4:00 PM   Specimen: Nasopharyngeal Swab; Nasopharyngeal(NP) swabs in vial transport medium  Result Value Ref Range Status   SARS Coronavirus 2 by RT PCR NEGATIVE NEGATIVE Final    Comment: (NOTE) SARS-CoV-2 target nucleic acids are NOT DETECTED.  The SARS-CoV-2 RNA is generally detectable in upper respiratory specimens during the acute phase of infection. The lowest concentration of SARS-CoV-2 viral copies this assay can detect is 138 copies/mL. A negative result does not preclude SARS-Cov-2 infection and should not be used as the sole basis for treatment or other patient management decisions.  A negative result may occur with  improper specimen collection/handling, submission of specimen other than nasopharyngeal swab, presence of viral mutation(s) within the areas targeted by this assay, and inadequate number of viral copies(<138 copies/mL). A negative result must be combined with clinical observations, patient history, and epidemiological information. The expected result is Negative.  Fact Sheet for Patients:  EntrepreneurPulse.com.au  Fact Sheet for Healthcare Providers:  IncredibleEmployment.be  This test is no t yet approved or cleared by the [place] FDA and  has been authorized for detection and/or diagnosis of SARS-CoV-2 by FDA under an Emergency Use Authorization (EUA). This EUA will remain   in effect (meaning this test can be used) for the duration of the COVID-19 declaration under Section 564(b)(1) of the Act, 21 U.S.C.section 360bbb-3(b)(1), unless the authorization is terminated  or revoked sooner.       Influenza A by PCR NEGATIVE NEGATIVE Final   Influenza B by PCR NEGATIVE NEGATIVE Final    Comment: (NOTE) The Xpert Xpress SARS-CoV-2/FLU/RSV plus assay is intended as an aid in the diagnosis of influenza from Nasopharyngeal swab specimens and should not be used as a sole basis for treatment. Nasal washings and aspirates are unacceptable for Xpert Xpress SARS-CoV-2/FLU/RSV testing.  Fact Sheet for Patients: EntrepreneurPulse.com.au  Fact Sheet for Healthcare Providers: IncredibleEmployment.be  This test is not yet approved or cleared by the [place] FDA and has been authorized for detection and/or diagnosis of SARS-CoV-2 by FDA under an Emergency Use Authorization (EUA). This EUA will remain in effect (meaning this test can be used) for the duration of the COVID-19 declaration under Section 564(b)(1) of the Act, 21 U.S.C. section 360bbb-3(b)(1), unless the authorization is terminated or revoked.  Performed at [facility], [address] [place], [place] [address]   Blood culture (routine x 2)     Status: None (Preliminary result)   Collection Time: 05/25/20  6:35 PM   Specimen: BLOOD  Result Value Ref Range Status   Specimen Description BLOOD RIGHT ANTECUBITAL  Final   Special Requests   Final    BOTTLES DRAWN AEROBIC AND ANAEROBIC Blood Culture adequate volume   Culture   Final    NO GROWTH 2 DAYS Performed at [facility], [address] [place], [place] [address]    Report Status PENDING  Incomplete  Body fluid culture     Status: None (Preliminary result)   Collection Time: 05/26/20  2:30 AM   Specimen: Body Fluid  Result Value Ref Range Status   Specimen Description FLUID PDFL  Final   Special Requests  NONE  Final   Gram Stain   Final    ABUNDANT WBC PRESENT, PREDOMINANTLY PMN NO ORGANISMS SEEN    Culture   Final    CULTURE REINCUBATED FOR BETTER GROWTH Performed at [facility], [address], [place], [place] [address]    Report Status PENDING  Incomplete  MRSA PCR Screening     Status: None   Collection Time: 05/26/20 10:23 AM   Specimen: Nasopharyngeal  Result Value Ref Range Status   MRSA by PCR NEGATIVE NEGATIVE Final    Comment:        The GeneXpert MRSA Assay (FDA approved for NASAL specimens only), is one component of a comprehensive MRSA colonization surveillance program. It is not intended to diagnose MRSA infection nor to guide or monitor treatment for MRSA infections. Performed at [facility], [place] [address] [place], [place] [address]  Studies: [place] FINE NEEDLE ASP 1ST LESION  Result Date: 05/27/2020 INDICATION: 70 year old female with a history of fluid along peritoneal dialysis catheter EXAM: ULTRASOUND-GUIDED ASPIRATION MEDICATIONS: None ANESTHESIA/SEDATION: None COMPLICATIONS: None PROCEDURE: Informed written consent was obtained from the patient after a thorough discussion of the procedural risks, benefits and alternatives. All questions were addressed. Maximal Sterile Barrier Technique was utilized including caps, mask, sterile gowns, sterile gloves, sterile drape, hand hygiene and skin antiseptic. A timeout was performed prior to the initiation of the procedure. Patient is prepped and draped in the usual sterile fashion. 1% lidocaine was used for local anesthesia. Scout ultrasound images were acquired. Using a Yueh needle, small aspirate was acquired of the complex fluid along the tract of the catheter. 1-2 cc of bloody fluid aspirated. Sample was sent for culture. Patient tolerated the procedure well and remained hemodynamically stable throughout. No complications were encountered and no significant blood loss. IMPRESSION: Ultrasound-guided  aspirate of complex fluid/tissue along the tract of the catheter with small sample sent for culture. Signed, Dulcy Fanny. Dellia Nims, RPVI [facility] Radiology Electronically Signed   By: Corrie Mckusick D.O.   On: 05/27/2020 08:05    Scheduled Meds: . amLODipine  10 mg Oral QPM  . aspirin EC  81 mg Oral Daily  . brimonidine  1 drop Both Eyes TID  . cloNIDine  0.2 mg Oral BID  . ezetimibe  10 mg Oral QHS   And  . simvastatin  20 mg Oral QHS  . gentamicin cream  1 application Topical Daily  . heparin injection (subcutaneous)  5,000 Units Subcutaneous Q8H  . hydrALAZINE  50 mg Oral TID  . insulin aspart  0-6 Units Subcutaneous TID WC  . insulin glargine  15 Units Subcutaneous QHS  . isosorbide mononitrate  30 mg Oral Daily  . metoprolol succinate  100 mg Oral Daily  . timolol  1 drop Both Eyes Daily  . torsemide  20 mg Oral Daily    Continuous Infusions:    LOS: 2 days     Kayleen Memos, MD [facility] Pager [phone]  If 7PM-7AM, please contact night-coverage [email or web address] Password [facility] 05/27/2020, 11:03 AM

## 2020-05-27 NOTE — Plan of Care (Signed)

## 2020-05-27 NOTE — Progress Notes (Signed)
Patient was initiated on PD last evening with heparinized bags. Fill 1 of 2500  was complete but was unable to drain. When detaching patient a long clot was present. Replaced attachment to PD catheter. MD Bhandari aware.

## 2020-05-27 NOTE — Progress Notes (Signed)
PT Cancellation Note  Patient Details Name: [name] MRN: [id number] DOB: March 07, 1951   Cancelled Treatment:    Reason Eval/Treat Not Completed: Other (comment) (Comfort care request) Per RN, patient has requested comfort care and hospice care. Discussed with RN about need for PT eval. PT will sign off at this time. Re-consult if needed.   Khaiden Segreto A. Gilford Rile PT, DPT Acute Rehabilitation Services Pager [phone] Office [phone]   Melene Plan Allred 05/27/2020, 11:10 AM

## 2020-05-27 NOTE — Progress Notes (Signed)
Palliative Medicine RN Note: Consult order rec'd from Dr Carolin Sicks, as PD access has failed and pt now wants hospice. Secure chat sent to Dr Carolin Sicks and [facility] attending, Dr [place] Crane.   Our team is having high consult volumes and may not get to her until tomorrow. If she wants hospice, TOC can set that up without a PMT consult. We are happy to see her to discuss [place], but if they are clear, I don't want discharge planning to be delayed because of PMT inavailability.  Marjie Skiff Jaxie Racanelli, RN, BSN, [facility] Palliative Medicine Team 05/27/2020 10:48 AM Office [phone]

## 2020-05-28 ENCOUNTER — Encounter (HOSPITAL_COMMUNITY): Payer: Self-pay | Admitting: Internal Medicine

## 2020-05-28 DIAGNOSIS — Z7189 Other specified counseling: Secondary | ICD-10-CM | POA: Diagnosis not present

## 2020-05-28 DIAGNOSIS — Z515 Encounter for palliative care: Secondary | ICD-10-CM

## 2020-05-28 DIAGNOSIS — T8571XA Infection and inflammatory reaction due to peritoneal dialysis catheter, initial encounter: Secondary | ICD-10-CM | POA: Diagnosis not present

## 2020-05-28 LAB — BODY FLUID CULTURE

## 2020-05-28 LAB — GLUCOSE, CAPILLARY
Glucose-Capillary: 38 mg/dL — CL (ref 70–99)
Glucose-Capillary: 60 mg/dL — ABNORMAL LOW (ref 70–99)

## 2020-05-28 MED ORDER — ACETAMINOPHEN 650 MG RE SUPP: 650.0000 mg | Freq: Four times a day (QID) | RECTAL | Status: DC | PRN

## 2020-05-28 MED ORDER — HYDROMORPHONE HCL 1 MG/ML IJ SOLN
1.0000 mg | INTRAMUSCULAR | Status: DC | PRN
  Administered 2020-05-28: 1 mg via INTRAVENOUS
  Filled 2020-05-28: qty 1

## 2020-05-28 MED ORDER — ONDANSETRON HCL 4 MG/2ML IJ SOLN: 4.0000 mg | Freq: Four times a day (QID) | INTRAMUSCULAR | Status: DC | PRN

## 2020-05-28 MED ORDER — POLYVINYL ALCOHOL 1.4 % OP SOLN
1.0000 [drp] | Freq: Four times a day (QID) | OPHTHALMIC | Status: DC | PRN
  Filled 2020-05-28: qty 15

## 2020-05-28 MED ORDER — HYDROMORPHONE HCL 2 MG PO TABS
4.0000 mg | ORAL_TABLET | ORAL | Status: DC | PRN
  Administered 2020-05-28 – 2020-05-29 (×3): 4 mg via ORAL
  Filled 2020-05-28 (×3): qty 2

## 2020-05-28 MED ORDER — ONDANSETRON 4 MG PO TBDP: 4.0000 mg | ORAL_TABLET | Freq: Four times a day (QID) | ORAL | Status: DC | PRN

## 2020-05-28 MED ORDER — GLYCOPYRROLATE 0.2 MG/ML IJ SOLN
0.2000 mg | INTRAMUSCULAR | Status: DC | PRN
  Filled 2020-05-28: qty 1

## 2020-05-28 MED ORDER — BIOTENE DRY MOUTH MT LIQD: 15.0000 mL | OROMUCOSAL | Status: DC | PRN

## 2020-05-28 MED ORDER — HALOPERIDOL LACTATE 5 MG/ML IJ SOLN: 0.5000 mg | INTRAMUSCULAR | Status: DC | PRN

## 2020-05-28 MED ORDER — GLYCOPYRROLATE 1 MG PO TABS
1.0000 mg | ORAL_TABLET | ORAL | Status: DC | PRN
  Filled 2020-05-28: qty 1

## 2020-05-28 MED ORDER — ACETAMINOPHEN 325 MG PO TABS: 650.0000 mg | ORAL_TABLET | Freq: Four times a day (QID) | ORAL | Status: DC | PRN

## 2020-05-28 MED ORDER — HALOPERIDOL 0.5 MG PO TABS
0.5000 mg | ORAL_TABLET | ORAL | Status: DC | PRN
  Filled 2020-05-28: qty 1

## 2020-05-28 MED ORDER — TRAZODONE HCL 50 MG PO TABS
100.0000 mg | ORAL_TABLET | Freq: Every day | ORAL | Status: DC
  Administered 2020-05-28 – 2020-05-30 (×3): 100 mg via ORAL
  Filled 2020-05-28 (×3): qty 2

## 2020-05-28 MED ORDER — LORAZEPAM 0.5 MG PO TABS: 0.5000 mg | ORAL_TABLET | ORAL | Status: DC | PRN

## 2020-05-28 MED ORDER — LORAZEPAM 2 MG/ML IJ SOLN
0.5000 mg | INTRAMUSCULAR | Status: DC | PRN
Start: 2020-05-28 — End: 2020-06-01

## 2020-05-28 MED ORDER — OXYCODONE HCL 5 MG PO TABS
5.0000 mg | ORAL_TABLET | Freq: Four times a day (QID) | ORAL | Status: DC | PRN
  Administered 2020-05-28 – 2020-05-31 (×2): 5 mg via ORAL
  Filled 2020-05-28 (×2): qty 1

## 2020-05-28 MED ORDER — HALOPERIDOL LACTATE 2 MG/ML PO CONC
0.5000 mg | ORAL | Status: DC | PRN
  Filled 2020-05-28: qty 0.3

## 2020-05-28 NOTE — Consult Note (Addendum)
Consultation Note Date: 05/28/2020   Patient Name: [name]  DOB: [birth year]  MRN: [id number]  Age / Sex: 70 y.o., female  PCP: Lucia Gaskins, MD Referring Physician: Kayleen Memos, DO  Reason for Consultation: Establishing goals of care and Inpatient hospice referral  HPI/Patient Profile: 70 y.o. female  with past medical history of ESRD on peritoneal dialysis, CAD, kidney cancer, DM, HTN/HLD, MI, sleep apnea uses CPAP, arthritis admitted on 05/25/2020 with abdominal pain and blood in dialysate fluid.   Clinical Assessment and Goals of Care: I have reviewed medical records including EPIC notes, labs and imaging, examined the patient and met at bedside with [name] to discuss diagnosis prognosis, GOC, EOL wishes, disposition and options.  I introduced Palliative Medicine as specialized medical care for people living with serious illness. It focuses on providing relief from the symptoms and stress of a serious illness.   We discussed her current illness and what it means in the larger context of her on-going co-morbidities.  Natural disease trajectory and expectations at EOL were discussed.  [name] shares that she is tired.  She states that she no longer wants to continue peritoneal dialysis which she has been doing for the past year.    [name] states that she is tired of having pain.  She shares that she would prefer to be mostly asleep and pain-free.  She tells me that she is ready to stop medications, lab draws, blood sugar checks.  We talk about prognosis with permission.  I share the expected timelines for changes in her mental status.   She states that her mother passed away after stopping dialysis, so she is experienced.  Hospice Care services outpatient were explained and offered.  We talked about residential hospice, comfort and dignity at end-of-life.  [name] states that she lives  in [place] and would prefer [facility].  Questions and concerns were addressed.  The family was encouraged to call with questions or concerns.   Conference with attending, bedside nursing staff, transition of care team related to patient condition, needs, goals of care.  Call to daughter, Claire Dolores.  She states that she is on the way to [place], will arrive around midnight.  She will arrive at [facility] tomorrow morning.  No questions.  She states that they went through this with [name] about a year ago.  "She never wanted to do dialysis".  Prognosis: 2 weeks or less would be expected based on [name]'s desire to stop peritoneal dialysis when she has dialysis dependent end-stage kidney disease.  HCPOA   PATIENT -[name] has clearly made her needs and wishes known.  She shares that she would not allow her daughter to change her wishes after she is unable to speak.   SUMMARY OF RECOMMENDATIONS   Full comfort care Comfort and dignity at end-of-life at residential hospice, [place].   Code Status/Advance Care Planning:  DNR  Symptom Management:   Medications adjusted for end-of-life care  Palliative  Prophylaxis:   Frequent Pain Assessment, Oral Care and Turn Reposition  Additional Recommendations (Limitations, Scope, Preferences):  Full Comfort Care  Psycho-social/Spiritual:   Desire for further Chaplaincy support:no  Additional Recommendations: Education on Hospice and Grief/Bereavement Support  Prognosis:   < 2 weeks, anticipated for peritoneal dialysis dependent patient to stops dialysis.  Discharge Planning: Requesting comfort and dignity at end-of-life, residential hospice at [place]      Primary Diagnoses: Present on Admission: . Infection associated with peritoneal dialysis catheter ([place]) . Mixed hyperlipidemia . Essential hypertension . Coronary atherosclerosis   I have reviewed the medical  record, interviewed the patient and family, and examined the patient. The following aspects are pertinent.  Past Medical History:  Diagnosis Date  . Arthritis   . CAD (coronary artery disease)    Cath 2008 total RCA with collaterals  . Cancer ([place])    kidney  . Chronic kidney disease 08/2011   kidney cancer  . Diabetes mellitus   . ESRD (end stage renal disease) ([place])    On dialysis as of 04/2019  . Glaucoma    BILATERAL  . HTN (hypertension), benign   . Hyperlipidemia   . Myocardial infarction ([place]) 2000, 2007  . Neuromuscular disorder ([place])    neck & bilateral hands  . Pneumonia 1990  . Seizures ([place]) 40 years ago   pre eclampsia  . Shortness of breath   . Sleep apnea    uses cpap   Social History   Socioeconomic History  . Marital status: Single    Spouse name: Not on file  . Number of children: Not on file  . Years of education: Not on file  . Highest education level: Not on file  Occupational History  . Not on file  Tobacco Use  . Smoking status: Former Smoker    Packs/day: 0.50    Years: 20.00    Pack years: 10.00    Types: Cigarettes    Quit date: 05/08/1999    Years since quitting: 21.0  . Smokeless tobacco: Former Systems developer    Types: Snuff  Vaping Use  . Vaping Use: Every day  . Substances: Nicotine  Substance and Sexual Activity  . Alcohol use: Not Currently  . Drug use: No  . Sexual activity: Not on file  Other Topics Concern  . Not on file  Social History Narrative   Single   Sedentary   Former smoker   No ETOH   Social Determinants of Radio broadcast assistant Strain: Not on file  Food Insecurity: Not on file  Transportation Needs: Not on file  Physical Activity: Not on file  Stress: Not on file  Social Connections: Not on file   Family History  Problem Relation Age of Onset  . Heart attack Father    Scheduled Meds: . amLODipine  10 mg Oral QPM  . aspirin EC  81 mg Oral Daily  . brimonidine  1 drop Both Eyes TID  . cloNIDine  0.2  mg Oral BID  . gentamicin cream  1 application Topical Daily  . hydrALAZINE  50 mg Oral TID  . isosorbide mononitrate  30 mg Oral Daily  . metoprolol succinate  100 mg Oral Daily  . timolol  1 drop Both Eyes Daily  . torsemide  20 mg Oral Daily   Continuous Infusions: PRN Meds:.acetaminophen **OR** acetaminophen, HYDROmorphone (DILAUDID) injection, HYDROmorphone, LORazepam, nitroGLYCERIN, senna Medications Prior to Admission:  Prior to Admission medications   Medication Sig Start Date End  Date Taking? Authorizing Provider  amLODipine (NORVASC) 10 MG tablet Take 1 tablet by mouth every evening.    Yes [provider]  amLODipine (NORVASC) 5 MG tablet Take 5 mg by mouth daily. 04/21/20  Yes [provider]  Aromatic Inhalants (DECONGESTANT INHALER IN) Inhale 2 puffs into the lungs daily.   Yes [provider]  aspirin EC 81 MG tablet Take 81 mg by mouth daily.   Yes [provider]  B Complex-C (B-COMPLEX WITH VITAMIN C) tablet Take 1 tablet by mouth every morning.    Yes [provider]  brimonidine (ALPHAGAN) 0.2 % ophthalmic solution Place 1 drop into both eyes in the morning and at bedtime. 03/21/20  Yes [provider]  cloNIDine (CATAPRES) 0.2 MG tablet Take 0.2 mg by mouth 2 (two) times daily. 03/15/20  Yes [provider]  Coenzyme Q10 (CO Q 10) 100 MG CAPS Take 1 tablet by mouth at bedtime.   Yes [provider]  ezetimibe-simvastatin (VYTORIN) 10-20 MG tablet Take 1 tablet by mouth at bedtime. 02/24/19  Yes [provider]  hydrALAZINE (APRESOLINE) 50 MG tablet Take 50 mg by mouth 3 (three) times daily. 02/23/20  Yes [provider]  HYDROmorphone (DILAUDID) 4 MG tablet Take 4 mg by mouth every 4 (four) hours as needed for severe pain.   Yes [provider]  insulin glargine (LANTUS) 100 UNIT/ML injection Inject 30 Units into the skin at bedtime.    Yes [provider]   isosorbide mononitrate (IMDUR) 30 MG 24 hr tablet Take 30 mg by mouth every morning.   Yes [provider]  metoprolol (TOPROL-XL) 100 MG 24 hr tablet Take 100 mg by mouth every morning.   Yes [provider]  Multiple Vitamin (MULTIVITAMIN) tablet Take 1 tablet by mouth daily.   Yes [provider]  nitroGLYCERIN (NITROSTAT) 0.4 MG SL tablet Place 0.4 mg under the tongue every 5 (five) minutes as needed for chest pain. 07/18/11  Yes Josue Hector, MD  senna (SENOKOT) 8.6 MG tablet Take 1 tablet by mouth as needed for constipation.    Yes [provider]  Spirulina 500 MG TABS Take 500 mg by mouth 3 (three) times daily.   Yes [provider]  timolol (TIMOPTIC) 0.5 % ophthalmic solution Place 1 drop into both eyes in the morning and at bedtime. 03/20/20  Yes [provider]  torsemide (DEMADEX) 20 MG tablet Take 20 mg by mouth daily. 08/20/19 08/19/20 Yes [provider]  VITAMIN D PO Take 1 tablet by mouth daily.   Yes [provider]  HYDROcodone-acetaminophen (NORCO) 5-325 MG tablet Take 1 tablet by mouth every 6 (six) hours as needed for moderate pain. Patient not taking: No sig reported 12/18/19   Algernon Huxley, MD  nystatin (MYCOSTATIN) 100000 UNIT/ML suspension Take 5 mLs by mouth as directed. Patient not taking: No sig reported 04/06/20   [provider]  Probiotic Product (PROBIOTIC DAILY PO) Take 1 tablet by mouth daily. gummy    [provider]  ramipril (ALTACE) 10 MG capsule Take 10 mg by mouth at bedtime. Patient not taking: Reported on 05/25/2020    [provider]   Allergies  Allergen Reactions  . Insect Extract Allergy Skin Test Swelling  . Other     Optical meds unknown to pt  . Propine [Dipivefrin] Itching and Other (See Comments)    Makes eyes turn a reddish orange color.  . Travatan [Travoprost] Itching  and Other (See Comments)    Makes eyes turn a reddish orange color  .  Xalatan [Latanoprost] Itching and Other (See Comments)    Makes eyes turn a reddish orange color.    Review of Systems  Unable to perform ROS: Other    Physical Exam Vitals and nursing note reviewed.  Constitutional:      General: She is not in acute distress.    Appearance: She is obese. She is ill-appearing.  HENT:     Head: Normocephalic and atraumatic.  Cardiovascular:     Rate and Rhythm: Normal rate.  Pulmonary:     Effort: Pulmonary effort is normal. No respiratory distress.  Skin:    General: Skin is warm and dry.  Neurological:     Mental Status: She is alert and oriented to person, place, and time.  Psychiatric:        Mood and Affect: Mood normal.        Behavior: Behavior normal.     Vital Signs: BP (!) 131/47 (BP Location: Left Arm)   Pulse 84   Temp 98.2 F (36.8 C) (Oral)   Resp 17   Ht 5' (1.524 m)   Wt 84.9 kg   SpO2 96%   BMI 36.55 kg/m  Pain Scale: 0-10 POSS *See Group Information*: 1-Acceptable,Awake and alert Pain Score: 0-No pain   SpO2: SpO2: 96 % O2 Device:SpO2: 96 % O2 Flow Rate: .   IO: Intake/output summary:   Intake/Output Summary (Last 24 hours) at 05/28/2020 1459 Last data filed at 05/27/2020 2120 Gross per 24 hour  Intake 120 ml  Output -  Net 120 ml    LBM: Last BM Date: 05/25/20 Baseline Weight: Weight: 84.9 kg Most recent weight: Weight: 84.9 kg     Palliative Assessment/Data:   Flowsheet Rows   Flowsheet Row Most Recent Value  Intake Tab   Referral Department Hospitalist  Unit at Time of Referral Med/Surg Unit  Palliative Care Primary Diagnosis Nephrology  Date Notified 05/25/20  Palliative Care Type New Palliative care  Reason for referral Clarify Goals of Care, Counsel Regarding Hospice  Date of Admission 05/27/20  Date first seen by Palliative Care 05/28/20  # of days Palliative referral response time 3 Day(s)  # of days IP prior to Palliative referral -2  Clinical Assessment   Palliative Performance  Scale Score 40%  Pain Max last 24 hours Not able to report  Pain Min Last 24 hours Not able to report  Dyspnea Max Last 24 Hours Not able to report  Dyspnea Min Last 24 hours Not able to report  Psychosocial & Spiritual Assessment   Palliative Care Outcomes       Time In: 0930 Time Out: 1040 Time Total: 70 minutes Greater than 50%  of this time was spent counseling and coordinating care related to the above assessment and plan.  Signed by: Drue Novel, NP   Please contact Palliative Medicine Team phone at [phone] for questions and concerns.  For individual provider: See Shea Evans

## 2020-05-28 NOTE — Progress Notes (Addendum)
Brief nephrology note: Patient wishes palliative and hospice care.  She does not want further dialysis or any treatment. She is now DNR, comfort measures and possibily discharge to hospice. Please call us if needed.  Katheran James, MD [facility].

## 2020-05-28 NOTE — Plan of Care (Signed)
  Problem: Education: Goal: Knowledge of General Education information will improve Description: Including pain rating scale, medication(s)/side effects and non-pharmacologic comfort measures Outcome: Progressing   Problem: Health Behavior/Discharge Planning: Goal: Ability to manage health-related needs will improve Outcome: Progressing   Problem: Clinical Measurements: Goal: Will remain free from infection Outcome: Progressing   

## 2020-05-28 NOTE — Progress Notes (Signed)
PROGRESS NOTE  [name] JQB:[id number] DOB: 1950/11/21 DOA: 05/25/2020 PCP: Lucia Gaskins, MD  HPI/Recap of past 24 hours: [name] is a 70 y.o. female with history of ESRD on peritoneal dialysis has been experiencing fluid return from the peritoneal catheter with increasing abdominal pain mostly in the left lower quadrant over the last 24 hours.  Reports chills.  Denies nausea vomiting or diarrhea.  ED Course: Patient had initially presented to the ER at [facility], CT abdomen pelvis without contrast showed features concerning for possible hematoma versus abscess in the deep portion of the peritoneal dialysis catheter region.  Nephrology was consulted requested to get cultures and to start IV antibiotics empirically.  Patient was transferred to [facility] for further management.  COVID screening test was negative.  Labs were significant for WBC count of 14.7K hemoglobin of 8.9K potassium was 3.7.   She was seen by nephrology with plan to continue PD using outpatient prescription but at 2.5% dextrose.  She was also seen by general surgery and IR.  Post image guided aspiration of anterior abdominal wall fluid on 05/26/2020 by interventional radiology Dr. Earleen Newport.  This revealed old blood products more consistent with hematoma.    On 05/27/2020 patient made decision for comfort care.  She has requested to stop hemodialysis.  She has declined any further blood draws or antibiotics or treatment to prolong her life.  She is alert and oriented x4.  CODE STATUS changed to DNR at patient's request and comfort care orders placed.  Goal is to focus on comfort.  TOC consulted to assist with hospice placement.  05/28/20: Seen and examined.  Hypoglycemic earlier this morning, feels better after eating.  No other complaints.  Seen by palliative care team.  Full comfort care.  Assessment/Plan: Principal Problem:   Infection associated with peritoneal dialysis catheter ([place]) Active Problems:    Mixed hyperlipidemia   Essential hypertension   Coronary atherosclerosis   DM (diabetes mellitus) ([place])  Assessment: -Presumptive peritonitis in the setting of peritoneal dialysis -ESRD on PD  -Anemia of chronic disease in the setting of ESRD -Type 2 diabetes with hyperglycemia -Essential hypertension -Hyperlipidemia -Physical debility/ambulatory dysfunction -DNR -Comfort care  Plan: Comfort orders in place. Focus on comfort. TOC assisting with hospice placement.  Code Status: DNR/comfort care.  Family Communication: Updated her daughter via phone on 05/27/2020.  Disposition Plan: Likely will DC to residential hospice.   Consultants:  Nephrology  Palliative care team.  TOC to assist with hospice placement.  Procedures:  PD   Image guided aspiration of anterior abdominal wall fluid on 05/26/2020 by interventional radiology   Antimicrobials:  Cefepime 05/25/2020>> 05/27/2020  IV vancomycin 05/25/2020>> 05/27/2020  Status is: Inpatient   Dispo:  Patient From: Home  Planned Disposition: Hospice  Expected discharge date:05/29/20  Medically stable for discharge: Yes, pending hospice placement.        Objective: Vitals:   05/26/20 2019 05/27/20 0900 05/27/20 2100 05/28/20 0827  BP: (!) 126/54 (!) 155/60 122/70 (!) 131/47  Pulse: 79 96 89 84  Resp: 18 16 16 17   Temp: 99.2 F (37.3 C) 100.3 F (37.9 C) 98.8 F (37.1 C) 98.2 F (36.8 C)  TempSrc: Oral Oral Oral Oral  SpO2: 92% 92% 90% 96%  Weight:      Height:        Intake/Output Summary (Last 24 hours) at 05/28/2020 1522 Last data filed at 05/27/2020 2120 Gross per 24 hour  Intake 120 ml  Output -  Net 120 ml   Filed Weights   05/25/20 2331  Weight: 84.9 kg    Exam: No significant change from prior exam.  . General: 70 y.o. year-old female well-developed well-nourished in no acute distress.  Alert and oriented x4. . Cardiovascular: Regular rate and rhythm no rubs or  gallops. Marland Kitchen Respiratory: Clear to auscultation no wheezes or rales.   . Abdomen: Obese tenderness at the site of the PD catheter with palpation.  Bowel sounds present.   . Musculoskeletal: 3+ pitting edema in lower extremities bilaterally. . Skin: No ulcerative lesions noted. Marland Kitchen Psychiatry: Mood is appropriate for condition and setting.   Data Reviewed: CBC: Recent Labs  Lab 05/25/20 1835 05/26/20 0321  WBC 14.7* 10.6*  NEUTROABS 11.9*  --   HGB 8.9* 7.6*  HCT 28.7* 23.7*  MCV 97.3 96.0  PLT 427* 751   Basic Metabolic Panel: Recent Labs  Lab 05/25/20 1835 05/26/20 0321  NA 137 137  K 3.7 3.6  CL 97* 99  CO2 29 26  GLUCOSE 63* 135*  BUN 65* 64*  CREATININE 9.28* 9.62*  CALCIUM 8.2* 7.8*   GFR: Estimated Creatinine Clearance: 5.4 mL/min (A) (by C-G formula based on SCr of 9.62 mg/dL (H)). Liver Function Tests: Recent Labs  Lab 05/25/20 1835 05/26/20 0321  AST 19 16  ALT 17 15  ALKPHOS 46 41  BILITOT 0.2* 0.5  PROT 5.7* 4.7*  ALBUMIN 1.8* 1.4*   Recent Labs  Lab 05/25/20 1835  LIPASE 18   No results for input(s): AMMONIA in the last 168 hours. Coagulation Profile: No results for input(s): INR, PROTIME in the last 168 hours. Cardiac Enzymes: No results for input(s): CKTOTAL, CKMB, CKMBINDEX, TROPONINI in the last 168 hours. BNP (last 3 results) No results for input(s): PROBNP in the last 8760 hours. HbA1C: Recent Labs    05/26/20 0321  HGBA1C 7.1*   CBG: Recent Labs  Lab 05/27/20 0642 05/27/20 2144 05/27/20 2258 05/28/20 0647 05/28/20 0805  GLUCAP 106* 42* 64* 38* 60*   Lipid Profile: No results for input(s): CHOL, HDL, LDLCALC, TRIG, CHOLHDL, LDLDIRECT in the last 72 hours. Thyroid Function Tests: No results for input(s): TSH, T4TOTAL, FREET4, T3FREE, THYROIDAB in the last 72 hours. Anemia Panel: No results for input(s): VITAMINB12, FOLATE, FERRITIN, TIBC, IRON, RETICCTPCT in the last 72 hours. Urine analysis:    Component Value  Date/Time   COLORURINE YELLOW 01/03/2007 1041   APPEARANCEUR CLEAR 01/03/2007 1041   LABSPEC 1.015 01/03/2007 1041   PHURINE 6.5 01/03/2007 1041   GLUCOSEU NEGATIVE 01/03/2007 [place] 01/03/2007 [place] 01/03/2007 [place] 01/03/2007 1041   PROTEINUR NEGATIVE 01/03/2007 1041   UROBILINOGEN 0.2 01/03/2007 1041   NITRITE NEGATIVE 01/03/2007 1041   LEUKOCYTESUR  01/03/2007 1041    NEGATIVE MICROSCOPIC NOT DONE ON URINES WITH NEGATIVE PROTEIN, BLOOD, LEUKOCYTES, NITRITE, OR GLUCOSE <1000 mg/dL.   Sepsis Labs: @LABRCNTIP (procalcitonin:4,lacticidven:4)  ) Recent Results (from the past 240 hour(s))  Resp Panel by RT-PCR (Flu A&B, Covid) Nasopharyngeal Swab     Status: None   Collection Time: 05/25/20  4:00 PM   Specimen: Nasopharyngeal Swab; Nasopharyngeal(NP) swabs in vial transport medium  Result Value Ref Range Status   SARS Coronavirus 2 by RT PCR NEGATIVE NEGATIVE Final    Comment: (NOTE) SARS-CoV-2 target nucleic acids are NOT DETECTED.  The SARS-CoV-2 RNA is generally detectable in upper respiratory specimens during the acute phase of infection. The lowest concentration of SARS-CoV-2 viral  copies this assay can detect is 138 copies/mL. A negative result does not preclude SARS-Cov-2 infection and should not be used as the sole basis for treatment or other patient management decisions. A negative result may occur with  improper specimen collection/handling, submission of specimen other than nasopharyngeal swab, presence of viral mutation(s) within the areas targeted by this assay, and inadequate number of viral copies(<138 copies/mL). A negative result must be combined with clinical observations, patient history, and epidemiological information. The expected result is Negative.  Fact Sheet for Patients:  EntrepreneurPulse.com.au  Fact Sheet for Healthcare Providers:   IncredibleEmployment.be  This test is no t yet approved or cleared by the [place] FDA and  has been authorized for detection and/or diagnosis of SARS-CoV-2 by FDA under an Emergency Use Authorization (EUA). This EUA will remain  in effect (meaning this test can be used) for the duration of the COVID-19 declaration under Section 564(b)(1) of the Act, 21 U.S.C.section 360bbb-3(b)(1), unless the authorization is terminated  or revoked sooner.       Influenza A by PCR NEGATIVE NEGATIVE Final   Influenza B by PCR NEGATIVE NEGATIVE Final    Comment: (NOTE) The Xpert Xpress SARS-CoV-2/FLU/RSV plus assay is intended as an aid in the diagnosis of influenza from Nasopharyngeal swab specimens and should not be used as a sole basis for treatment. Nasal washings and aspirates are unacceptable for Xpert Xpress SARS-CoV-2/FLU/RSV testing.  Fact Sheet for Patients: EntrepreneurPulse.com.au  Fact Sheet for Healthcare Providers: IncredibleEmployment.be  This test is not yet approved or cleared by the [place] FDA and has been authorized for detection and/or diagnosis of SARS-CoV-2 by FDA under an Emergency Use Authorization (EUA). This EUA will remain in effect (meaning this test can be used) for the duration of the COVID-19 declaration under Section 564(b)(1) of the Act, 21 U.S.C. section 360bbb-3(b)(1), unless the authorization is terminated or revoked.  Performed at [facility], [address], [place], [place] [address]   Blood culture (routine x 2)     Status: None (Preliminary result)   Collection Time: 05/25/20  6:35 PM   Specimen: BLOOD  Result Value Ref Range Status   Specimen Description BLOOD RIGHT ANTECUBITAL  Final   Special Requests   Final    BOTTLES DRAWN AEROBIC AND ANAEROBIC Blood Culture adequate volume   Culture   Final    NO GROWTH 3 DAYS Performed at [facility], [address] [place], [place]  [address]    Report Status PENDING  Incomplete  Body fluid culture     Status: None   Collection Time: 05/26/20  2:30 AM   Specimen: Body Fluid  Result Value Ref Range Status   Specimen Description FLUID PDFL  Final   Special Requests NONE  Final   Gram Stain   Final    ABUNDANT WBC PRESENT, PREDOMINANTLY PMN NO ORGANISMS SEEN    Culture   Final    ABUNDANT ENTEROBACTER CLOACAE CRITICAL RESULT CALLED TO, READ BACK BY AND VERIFIED WITH: DO HALL, C. VIA EPIC [phone] FCP Performed at [facility], [place] [address] [place], [place] [address]    Report Status 05/28/2020 FINAL  Final   Organism ID, Bacteria ENTEROBACTER CLOACAE  Final      Susceptibility   Enterobacter cloacae - MIC*    CEFAZOLIN >=64 RESISTANT Resistant     CEFEPIME <=0.12 SENSITIVE Sensitive     CEFTAZIDIME <=1 SENSITIVE Sensitive     CIPROFLOXACIN <=0.25 SENSITIVE Sensitive  GENTAMICIN <=1 SENSITIVE Sensitive     IMIPENEM 1 SENSITIVE Sensitive     TRIMETH/SULFA <=20 SENSITIVE Sensitive     PIP/TAZO <=4 SENSITIVE Sensitive     * ABUNDANT ENTEROBACTER CLOACAE  MRSA PCR Screening     Status: None   Collection Time: 05/26/20 10:23 AM   Specimen: Nasopharyngeal  Result Value Ref Range Status   MRSA by PCR NEGATIVE NEGATIVE Final    Comment:        The GeneXpert MRSA Assay (FDA approved for NASAL specimens only), is one component of a comprehensive MRSA colonization surveillance program. It is not intended to diagnose MRSA infection nor to guide or monitor treatment for MRSA infections. Performed at [facility], [place] [address] [place], [place] [address]   Aerobic/Anaerobic Culture (surgical/deep wound)     Status: None (Preliminary result)   Collection Time: 05/26/20  4:19 PM   Specimen: Abdomen; Wound  Result Value Ref Range Status   Specimen Description ABDOMEN  Final   Special Requests NONE  Final   Gram Stain NO WBC SEEN NO ORGANISMS SEEN   Final   Culture   Final    NO GROWTH 1  DAY Performed at [facility], [place] [address], [place], [place] [address]    Report Status PENDING  Incomplete      Studies: No results found.  Scheduled Meds: . gentamicin cream  1 application Topical Daily  . torsemide  20 mg Oral Daily  . traZODone  100 mg Oral QHS    Continuous Infusions:    LOS: 3 days     Kayleen Memos, MD [facility] Pager [phone]  If 7PM-7AM, please contact night-coverage [email or web address] Password [facility] 05/28/2020, 3:22 PM

## 2020-05-28 NOTE — Plan of Care (Signed)
  Problem: Education: Goal: Knowledge of General Education information will improve Description: Including pain rating scale, medication(s)/side effects and non-pharmacologic comfort measures 05/28/2020 2323 by Guinevere Scarlet, RN Outcome: Progressing 05/28/2020 2322 by Guinevere Scarlet, RN Outcome: Progressing   Problem: Health Behavior/Discharge Planning: Goal: Ability to manage health-related needs will improve 05/28/2020 2323 by Guinevere Scarlet, RN Outcome: Progressing 05/28/2020 2322 by Guinevere Scarlet, RN Outcome: Progressing   Problem: Clinical Measurements: Goal: Ability to maintain clinical measurements within normal limits will improve Outcome: Progressing Goal: Will remain free from infection Outcome: Progressing

## 2020-05-28 NOTE — TOC Progression Note (Addendum)
Transition of Care [facility]) - Progression Note    Patient Details  Name: [name] MRN: [id number] Date of Birth: 06-Oct-1950  Transition of Care [facility]) CM/SW Contact  Marely Apgar, [place], [place] Phone Number:[phone] 05/28/2020, 2:51 PM  Clinical Narrative:    Patient requesting residential hospice. [facility] chooses [facility]. [facility] contacted, spoke with Shirlean Mylar who requested supporting documentation be faxed to [phone]. No beds available at this time, patient currently on waiting list.  Transition of Care to continue to follow  [facility], LCSW Transition of Care [phone]         Expected Discharge Plan and Services                                                 Social Determinants of Health (SDOH) Interventions    Readmission Risk Interventions No flowsheet data found.

## 2020-05-28 NOTE — Progress Notes (Signed)
Hypoglycemic Event  CBG:38 Treatment: 15 ml of D50, refused the rest of it and refused orange juice as well Symptoms: weakness  Follow-up CBG: Time: CBG Result:  Possible Reasons for Event: refused to eat , refused drink anything Comments/MD notified:yes, earlier on for the first occurrence,, will page again and notify rapid response too    Guinevere Scarlet

## 2020-05-29 DIAGNOSIS — T8571XA Infection and inflammatory reaction due to peritoneal dialysis catheter, initial encounter: Secondary | ICD-10-CM | POA: Diagnosis not present

## 2020-05-29 MED ORDER — HYDROMORPHONE HCL 2 MG PO TABS
4.0000 mg | ORAL_TABLET | ORAL | Status: DC | PRN
  Administered 2020-05-29 – 2020-05-31 (×4): 4 mg via ORAL
  Filled 2020-05-29 (×4): qty 2

## 2020-05-29 NOTE — Progress Notes (Signed)
PROGRESS NOTE  [name] WRU:[id number] DOB: 09/12/1950 DOA: 05/25/2020 PCP: Lucia Gaskins, MD  HPI/Recap of past 24 hours: [name] is a 70 y.o. female with history of ESRD on peritoneal dialysis has been experiencing fluid return from the peritoneal catheter with increasing abdominal pain mostly in the left lower quadrant over the last 24 hours.  Reports chills.  Denies nausea vomiting or diarrhea.  ED Course: Patient had initially presented to the ER at [facility], CT abdomen pelvis without contrast showed features concerning for possible hematoma versus abscess in the deep portion of the peritoneal dialysis catheter region.  Nephrology was consulted requested to get cultures and to start IV antibiotics empirically.  Patient was transferred to [facility] for further management.  COVID screening test was negative.  Labs were significant for WBC count of 14.7K hemoglobin of 8.9K potassium was 3.7.   She was seen by nephrology with plan to continue PD using outpatient prescription but at 2.5% dextrose.  She was also seen by general surgery and IR.  Post image guided aspiration of anterior abdominal wall fluid on 05/26/2020 by interventional radiology Dr. Earleen Newport.  This revealed old blood products more consistent with hematoma.    On 05/27/2020 patient made decision for comfort care.  She has requested to stop hemodialysis.  She has declined any further blood draws or antibiotics or treatment to prolong her life.  She is alert and oriented x4.  CODE STATUS changed to DNR at patient's request and comfort care orders placed.  Goal is to focus on comfort.  TOC consulted to assist with hospice placement.  05/29/20: Seen at her bedside.  She lost her IV access.  She is on oral pain medications.  Assessment/Plan: Principal Problem:   Infection associated with peritoneal dialysis catheter ([place]) Active Problems:   Mixed hyperlipidemia   Essential hypertension   Coronary atherosclerosis    DM (diabetes mellitus) ([place])  Assessment: -Presumptive peritonitis in the setting of peritoneal dialysis -ESRD on PD  -Anemia of chronic disease in the setting of ESRD -Type 2 diabetes with hyperglycemia -Essential hypertension -Hyperlipidemia -Physical debility/ambulatory dysfunction -DNR -Comfort care  Plan: Comfort orders in place. Focus on comfort. TOC assisting with hospice placement.  Code Status: DNR/comfort care.  Family Communication: Updated her daughter via phone on 05/27/2020.  Disposition Plan: Likely will DC to residential hospice.   Consultants:  Nephrology  Palliative care team.  TOC to assist with hospice placement.  Procedures:  PD   Image guided aspiration of anterior abdominal wall fluid on 05/26/2020 by interventional radiology   Antimicrobials:  Cefepime 05/25/2020>> 05/27/2020  IV vancomycin 05/25/2020>> 05/27/2020  Status is: Inpatient   Dispo:  Patient From: Home  Planned Disposition: Hospice  Expected discharge date:05/29/20  Medically stable for discharge: Yes, pending hospice placement.        Objective: Vitals:   05/28/20 0827 05/28/20 1927 05/29/20 0823 05/29/20 1500  BP: (!) 131/47 124/80 (!) 145/65 139/69  Pulse: 84 90 100 (!) 102  Resp: 17 16 18 16   Temp: 98.2 F (36.8 C) 98.6 F (37 C) 98.1 F (36.7 C) 98.5 F (36.9 C)  TempSrc: Oral  Oral Oral  SpO2: 96% 92% 97% 97%  Weight:      Height:        Intake/Output Summary (Last 24 hours) at 05/29/2020 1708 Last data filed at 05/28/2020 2300 Gross per 24 hour  Intake 480 ml  Output -  Net 480 ml   [employer]  05/25/20 2331  Weight: 84.9 kg    Exam: No significant changes from prior exam.  . General: 70 y.o. year-old female well-developed well-nourished in no acute distress.  Alert and oriented x4. . Cardiovascular: Regular rate and rhythm no rubs or gallops. Marland Kitchen Respiratory: Clear to auscultation no wheezes or rales.   . Abdomen: Obese tenderness at the  site of the PD catheter with palpation.  Bowel sounds present.   . Musculoskeletal: 3+ pitting edema in lower extremities bilaterally. . Skin: No ulcerative lesions noted. Marland Kitchen Psychiatry: Mood is appropriate for condition and setting.   Data Reviewed: CBC: Recent Labs  Lab 05/25/20 1835 05/26/20 0321  WBC 14.7* 10.6*  NEUTROABS 11.9*  --   HGB 8.9* 7.6*  HCT 28.7* 23.7*  MCV 97.3 96.0  PLT 427* 481   Basic Metabolic Panel: Recent Labs  Lab 05/25/20 1835 05/26/20 0321  NA 137 137  K 3.7 3.6  CL 97* 99  CO2 29 26  GLUCOSE 63* 135*  BUN 65* 64*  CREATININE 9.28* 9.62*  CALCIUM 8.2* 7.8*   GFR: Estimated Creatinine Clearance: 5.4 mL/min (A) (by C-G formula based on SCr of 9.62 mg/dL (H)). Liver Function Tests: Recent Labs  Lab 05/25/20 1835 05/26/20 0321  AST 19 16  ALT 17 15  ALKPHOS 46 41  BILITOT 0.2* 0.5  PROT 5.7* 4.7*  ALBUMIN 1.8* 1.4*   Recent Labs  Lab 05/25/20 1835  LIPASE 18   No results for input(s): AMMONIA in the last 168 hours. Coagulation Profile: No results for input(s): INR, PROTIME in the last 168 hours. Cardiac Enzymes: No results for input(s): CKTOTAL, CKMB, CKMBINDEX, TROPONINI in the last 168 hours. BNP (last 3 results) No results for input(s): PROBNP in the last 8760 hours. HbA1C: No results for input(s): HGBA1C in the last 72 hours. CBG: Recent Labs  Lab 05/27/20 0642 05/27/20 2144 05/27/20 2258 05/28/20 0647 05/28/20 0805  GLUCAP 106* 42* 64* 38* 60*   Lipid Profile: No results for input(s): CHOL, HDL, LDLCALC, TRIG, CHOLHDL, LDLDIRECT in the last 72 hours. Thyroid Function Tests: No results for input(s): TSH, T4TOTAL, FREET4, T3FREE, THYROIDAB in the last 72 hours. Anemia Panel: No results for input(s): VITAMINB12, FOLATE, FERRITIN, TIBC, IRON, RETICCTPCT in the last 72 hours. Urine analysis:    Component Value Date/Time   COLORURINE YELLOW 01/03/2007 1041   APPEARANCEUR CLEAR 01/03/2007 1041   LABSPEC 1.015  01/03/2007 1041   PHURINE 6.5 01/03/2007 1041   GLUCOSEU NEGATIVE 01/03/2007 [place] 01/03/2007 [place] 01/03/2007 [place] 01/03/2007 1041   PROTEINUR NEGATIVE 01/03/2007 1041   UROBILINOGEN 0.2 01/03/2007 1041   NITRITE NEGATIVE 01/03/2007 1041   LEUKOCYTESUR  01/03/2007 1041    NEGATIVE MICROSCOPIC NOT DONE ON URINES WITH NEGATIVE PROTEIN, BLOOD, LEUKOCYTES, NITRITE, OR GLUCOSE <1000 mg/dL.   Sepsis Labs: @LABRCNTIP (procalcitonin:4,lacticidven:4)  ) Recent Results (from the past 240 hour(s))  Resp Panel by RT-PCR (Flu A&B, Covid) Nasopharyngeal Swab     Status: None   Collection Time: 05/25/20  4:00 PM   Specimen: Nasopharyngeal Swab; Nasopharyngeal(NP) swabs in vial transport medium  Result Value Ref Range Status   SARS Coronavirus 2 by RT PCR NEGATIVE NEGATIVE Final    Comment: (NOTE) SARS-CoV-2 target nucleic acids are NOT DETECTED.  The SARS-CoV-2 RNA is generally detectable in upper respiratory specimens during the acute phase of infection. The lowest concentration of SARS-CoV-2 viral copies this assay can detect is 138 copies/mL. A negative result  does not preclude SARS-Cov-2 infection and should not be used as the sole basis for treatment or other patient management decisions. A negative result may occur with  improper specimen collection/handling, submission of specimen other than nasopharyngeal swab, presence of viral mutation(s) within the areas targeted by this assay, and inadequate number of viral copies(<138 copies/mL). A negative result must be combined with clinical observations, patient history, and epidemiological information. The expected result is Negative.  Fact Sheet for Patients:  EntrepreneurPulse.com.au  Fact Sheet for Healthcare Providers:  IncredibleEmployment.be  This test is no t yet approved or cleared by the [place] FDA and  has been authorized for  detection and/or diagnosis of SARS-CoV-2 by FDA under an Emergency Use Authorization (EUA). This EUA will remain  in effect (meaning this test can be used) for the duration of the COVID-19 declaration under Section 564(b)(1) of the Act, 21 U.S.C.section 360bbb-3(b)(1), unless the authorization is terminated  or revoked sooner.       Influenza A by PCR NEGATIVE NEGATIVE Final   Influenza B by PCR NEGATIVE NEGATIVE Final    Comment: (NOTE) The Xpert Xpress SARS-CoV-2/FLU/RSV plus assay is intended as an aid in the diagnosis of influenza from Nasopharyngeal swab specimens and should not be used as a sole basis for treatment. Nasal washings and aspirates are unacceptable for Xpert Xpress SARS-CoV-2/FLU/RSV testing.  Fact Sheet for Patients: EntrepreneurPulse.com.au  Fact Sheet for Healthcare Providers: IncredibleEmployment.be  This test is not yet approved or cleared by the [place] FDA and has been authorized for detection and/or diagnosis of SARS-CoV-2 by FDA under an Emergency Use Authorization (EUA). This EUA will remain in effect (meaning this test can be used) for the duration of the COVID-19 declaration under Section 564(b)(1) of the Act, 21 U.S.C. section 360bbb-3(b)(1), unless the authorization is terminated or revoked.  Performed at [facility], [address] [place], [place] [address]   Blood culture (routine x 2)     Status: None (Preliminary result)   Collection Time: 05/25/20  6:35 PM   Specimen: BLOOD  Result Value Ref Range Status   Specimen Description BLOOD RIGHT ANTECUBITAL  Final   Special Requests   Final    BOTTLES DRAWN AEROBIC AND ANAEROBIC Blood Culture adequate volume   Culture   Final    NO GROWTH 4 DAYS Performed at [facility], [address] [place], [place] [address]    Report Status PENDING  Incomplete  Body fluid culture     Status: None   Collection Time: 05/26/20  2:30 AM   Specimen: Body Fluid   Result Value Ref Range Status   Specimen Description FLUID PDFL  Final   Special Requests NONE  Final   Gram Stain   Final    ABUNDANT WBC PRESENT, PREDOMINANTLY PMN NO ORGANISMS SEEN    Culture   Final    ABUNDANT ENTEROBACTER CLOACAE CRITICAL RESULT CALLED TO, READ BACK BY AND VERIFIED WITH: DO Lakeyshia Tuckerman, C. VIA EPIC [phone] FCP Performed at [facility], [place] [address], [place], [place] [address]    Report Status 05/28/2020 FINAL  Final   Organism ID, Bacteria ENTEROBACTER CLOACAE  Final      Susceptibility   Enterobacter cloacae - MIC*    CEFAZOLIN >=64 RESISTANT Resistant     CEFEPIME <=0.12 SENSITIVE Sensitive     CEFTAZIDIME <=1 SENSITIVE Sensitive     CIPROFLOXACIN <=0.25 SENSITIVE Sensitive     GENTAMICIN <=1 SENSITIVE Sensitive     IMIPENEM 1  SENSITIVE Sensitive     TRIMETH/SULFA <=20 SENSITIVE Sensitive     PIP/TAZO <=4 SENSITIVE Sensitive     * ABUNDANT ENTEROBACTER CLOACAE  MRSA PCR Screening     Status: None   Collection Time: 05/26/20 10:23 AM   Specimen: Nasopharyngeal  Result Value Ref Range Status   MRSA by PCR NEGATIVE NEGATIVE Final    Comment:        The GeneXpert MRSA Assay (FDA approved for NASAL specimens only), is one component of a comprehensive MRSA colonization surveillance program. It is not intended to diagnose MRSA infection nor to guide or monitor treatment for MRSA infections. Performed at [facility], [place] [address] [place], [place] [address]   Aerobic/Anaerobic Culture (surgical/deep wound)     Status: None (Preliminary result)   Collection Time: 05/26/20  4:19 PM   Specimen: Abdomen; Wound  Result Value Ref Range Status   Specimen Description ABDOMEN  Final   Special Requests NONE  Final   Gram Stain NO WBC SEEN NO ORGANISMS SEEN   Final   Culture   Final    NO GROWTH 2 DAYS NO ANAEROBES ISOLATED; CULTURE IN PROGRESS FOR 5 DAYS Performed at [facility], [place] [address] [employer], [place] [address]    Report  Status PENDING  Incomplete      Studies: No results found.  Scheduled Meds: . gentamicin cream  1 application Topical Daily  . torsemide  20 mg Oral Daily  . traZODone  100 mg Oral QHS    Continuous Infusions:    LOS: 4 days     Kayleen Memos, MD [facility] Pager [phone]  If 7PM-7AM, please contact night-coverage [email or web address] Password [facility] 05/29/2020, 5:08 PM

## 2020-05-29 NOTE — Plan of Care (Signed)

## 2020-05-29 NOTE — Plan of Care (Signed)
  Problem: Education: Goal: Knowledge of General Education information will improve Description: Including pain rating scale, medication(s)/side effects and non-pharmacologic comfort measures Outcome: Progressing   Problem: Pain Managment: Goal: General experience of comfort will improve Outcome: Progressing   Problem: Safety: Goal: Ability to remain free from injury will improve Outcome: Progressing   Problem: Activity: Goal: Risk for activity intolerance will decrease Outcome: Not Progressing   Problem: Nutrition: Goal: Adequate nutrition will be maintained Outcome: Not Progressing

## 2020-05-30 DIAGNOSIS — T8571XA Infection and inflammatory reaction due to peritoneal dialysis catheter, initial encounter: Secondary | ICD-10-CM | POA: Diagnosis not present

## 2020-05-30 LAB — CULTURE, BLOOD (ROUTINE X 2)
Culture: NO GROWTH
Special Requests: ADEQUATE

## 2020-05-30 NOTE — Progress Notes (Signed)
PROGRESS NOTE  [name] MIW:[id number] DOB: [birth year] DOA: 05/25/2020 PCP: Lucia Gaskins, MD  HPI/Recap of past 24 hours: [name] is a 70 y.o. female with history of ESRD on peritoneal dialysis has been experiencing fluid return from the peritoneal catheter with increasing abdominal pain mostly in the left lower quadrant over the last 24 hours.  Reports chills.  Denies nausea vomiting or diarrhea.  ED Course: Patient had initially presented to the ER at [facility], CT abdomen pelvis without contrast showed features concerning for possible hematoma versus abscess in the deep portion of the peritoneal dialysis catheter region.  Nephrology was consulted requested to get cultures and to start IV antibiotics empirically.  Patient was transferred to [facility] for further management.  COVID screening test was negative.  Labs were significant for WBC count of 14.7K hemoglobin of 8.9K potassium was 3.7.   She was seen by nephrology with plan to continue PD using outpatient prescription but at 2.5% dextrose.  She was also seen by general surgery and IR.  Post image guided aspiration of anterior abdominal wall fluid on 05/26/2020 by interventional radiology Dr. Earleen Newport.  This revealed old blood products more consistent with hematoma.    On 05/27/2020 patient made decision for comfort care.  She has requested to stop hemodialysis.  She has declined any further blood draws or antibiotics or treatment to prolong her life.  She is alert and oriented x4.  CODE STATUS changed to DNR at patient's request and comfort care orders placed.  Goal is to focus on comfort.  TOC consulted to assist with hospice placement.  05/30/20: Seen at her bedside.  She lost her IV access.  She is on oral pain medications.  Awaiting residential hospice placement.  Assessment/Plan: Principal Problem:   Infection associated with peritoneal dialysis catheter ([place]) Active Problems:   Mixed hyperlipidemia   Essential  hypertension   Coronary atherosclerosis   DM (diabetes mellitus) ([place])  Assessment: -Presumptive peritonitis in the setting of peritoneal dialysis -ESRD on PD  -Anemia of chronic disease in the setting of ESRD -Type 2 diabetes with hyperglycemia -Essential hypertension -Hyperlipidemia -Physical debility/ambulatory dysfunction -DNR -Comfort care  Plan: Comfort orders in place. Focus on comfort. TOC assisting with hospice placement.  Code Status: DNR/comfort care.  Family Communication: Updated her daughter via phone on 05/27/2020.  Disposition Plan: Likely will DC to residential hospice.   Consultants:  Nephrology  Palliative care team.  TOC to assist with hospice placement.  Procedures:  PD   Image guided aspiration of anterior abdominal wall fluid on 05/26/2020 by interventional radiology   Antimicrobials:  Cefepime 05/25/2020>> 05/27/2020  IV vancomycin 05/25/2020>> 05/27/2020  Status is: Inpatient   Dispo:  Patient From: Home  Planned Disposition: Hospice  Expected discharge date:05/29/20  Medically stable for discharge: Yes, pending hospice placement.        Objective: Vitals:   05/29/20 1500 05/29/20 2038 05/30/20 0800 05/30/20 1500  BP: 139/69 (!) 147/61 131/66 134/68  Pulse: (!) 102 (!) 102 90   Resp: 16 16 16 16   Temp: 98.5 F (36.9 C) 98.5 F (36.9 C) 98.5 F (36.9 C) 98.4 F (36.9 C)  TempSrc: Oral Oral Oral Oral  SpO2: 97% 90% 98% 99%  Weight:      Height:        Intake/Output Summary (Last 24 hours) at 05/30/2020 1650 Last data filed at 05/30/2020 1500 Gross per 24 hour  Intake 240 ml  Output --  Net 240 ml  Filed Weights   05/25/20 2331  Weight: 84.9 kg    Exam: No significant changes from prior exam.  . General: 70 y.o. year-old female well-developed well-nourished in no acute distress.  Alert and oriented x4. . Cardiovascular: Regular rate and rhythm no rubs or gallops. Marland Kitchen Respiratory: Clear to auscultation no  wheezes or rales.   . Abdomen: Obese tenderness at the site of the PD catheter with palpation.  Bowel sounds present.   . Musculoskeletal: 3+ pitting edema in lower extremities bilaterally. . Skin: No ulcerative lesions noted. Marland Kitchen Psychiatry: Mood is appropriate for condition and setting.   Data Reviewed: CBC: Recent Labs  Lab 05/25/20 1835 05/26/20 0321  WBC 14.7* 10.6*  NEUTROABS 11.9*  --   HGB 8.9* 7.6*  HCT 28.7* 23.7*  MCV 97.3 96.0  PLT 427* 419   Basic Metabolic Panel: Recent Labs  Lab 05/25/20 1835 05/26/20 0321  NA 137 137  K 3.7 3.6  CL 97* 99  CO2 29 26  GLUCOSE 63* 135*  BUN 65* 64*  CREATININE 9.28* 9.62*  CALCIUM 8.2* 7.8*   GFR: Estimated Creatinine Clearance: 5.4 mL/min (A) (by C-G formula based on SCr of 9.62 mg/dL (H)). Liver Function Tests: Recent Labs  Lab 05/25/20 1835 05/26/20 0321  AST 19 16  ALT 17 15  ALKPHOS 46 41  BILITOT 0.2* 0.5  PROT 5.7* 4.7*  ALBUMIN 1.8* 1.4*   Recent Labs  Lab 05/25/20 1835  LIPASE 18   No results for input(s): AMMONIA in the last 168 hours. Coagulation Profile: No results for input(s): INR, PROTIME in the last 168 hours. Cardiac Enzymes: No results for input(s): CKTOTAL, CKMB, CKMBINDEX, TROPONINI in the last 168 hours. BNP (last 3 results) No results for input(s): PROBNP in the last 8760 hours. HbA1C: No results for input(s): HGBA1C in the last 72 hours. CBG: Recent Labs  Lab 05/27/20 0642 05/27/20 2144 05/27/20 2258 05/28/20 0647 05/28/20 0805  GLUCAP 106* 42* 64* 38* 60*   Lipid Profile: No results for input(s): CHOL, HDL, LDLCALC, TRIG, CHOLHDL, LDLDIRECT in the last 72 hours. Thyroid Function Tests: No results for input(s): TSH, T4TOTAL, FREET4, T3FREE, THYROIDAB in the last 72 hours. Anemia Panel: No results for input(s): VITAMINB12, FOLATE, FERRITIN, TIBC, IRON, RETICCTPCT in the last 72 hours. Urine analysis:    Component Value Date/Time   COLORURINE YELLOW 01/03/2007 1041    APPEARANCEUR CLEAR 01/03/2007 1041   LABSPEC 1.015 01/03/2007 1041   PHURINE 6.5 01/03/2007 1041   GLUCOSEU NEGATIVE 01/03/2007 [place] 01/03/2007 [place] 01/03/2007 [place] 01/03/2007 1041   PROTEINUR NEGATIVE 01/03/2007 1041   UROBILINOGEN 0.2 01/03/2007 1041   NITRITE NEGATIVE 01/03/2007 1041   LEUKOCYTESUR  01/03/2007 1041    NEGATIVE MICROSCOPIC NOT DONE ON URINES WITH NEGATIVE PROTEIN, BLOOD, LEUKOCYTES, NITRITE, OR GLUCOSE <1000 mg/dL.   Sepsis Labs: @LABRCNTIP (procalcitonin:4,lacticidven:4)  ) Recent Results (from the past 240 hour(s))  Resp Panel by RT-PCR (Flu A&B, Covid) Nasopharyngeal Swab     Status: None   Collection Time: 05/25/20  4:00 PM   Specimen: Nasopharyngeal Swab; Nasopharyngeal(NP) swabs in vial transport medium  Result Value Ref Range Status   SARS Coronavirus 2 by RT PCR NEGATIVE NEGATIVE Final    Comment: (NOTE) SARS-CoV-2 target nucleic acids are NOT DETECTED.  The SARS-CoV-2 RNA is generally detectable in upper respiratory specimens during the acute phase of infection. The lowest concentration of SARS-CoV-2 viral copies this assay can detect is 138  copies/mL. A negative result does not preclude SARS-Cov-2 infection and should not be used as the sole basis for treatment or other patient management decisions. A negative result may occur with  improper specimen collection/handling, submission of specimen other than nasopharyngeal swab, presence of viral mutation(s) within the areas targeted by this assay, and inadequate number of viral copies(<138 copies/mL). A negative result must be combined with clinical observations, patient history, and epidemiological information. The expected result is Negative.  Fact Sheet for Patients:  EntrepreneurPulse.com.au  Fact Sheet for Healthcare Providers:  IncredibleEmployment.be  This test is no t yet approved or cleared by  the [place] FDA and  has been authorized for detection and/or diagnosis of SARS-CoV-2 by FDA under an Emergency Use Authorization (EUA). This EUA will remain  in effect (meaning this test can be used) for the duration of the COVID-19 declaration under Section 564(b)(1) of the Act, 21 U.S.C.section 360bbb-3(b)(1), unless the authorization is terminated  or revoked sooner.       Influenza A by PCR NEGATIVE NEGATIVE Final   Influenza B by PCR NEGATIVE NEGATIVE Final    Comment: (NOTE) The Xpert Xpress SARS-CoV-2/FLU/RSV plus assay is intended as an aid in the diagnosis of influenza from Nasopharyngeal swab specimens and should not be used as a sole basis for treatment. Nasal washings and aspirates are unacceptable for Xpert Xpress SARS-CoV-2/FLU/RSV testing.  Fact Sheet for Patients: EntrepreneurPulse.com.au  Fact Sheet for Healthcare Providers: IncredibleEmployment.be  This test is not yet approved or cleared by the [place] FDA and has been authorized for detection and/or diagnosis of SARS-CoV-2 by FDA under an Emergency Use Authorization (EUA). This EUA will remain in effect (meaning this test can be used) for the duration of the COVID-19 declaration under Section 564(b)(1) of the Act, 21 U.S.C. section 360bbb-3(b)(1), unless the authorization is terminated or revoked.  Performed at [facility], [address], [place], [place] [address]   Blood culture (routine x 2)     Status: None   Collection Time: 05/25/20  6:35 PM   Specimen: BLOOD  Result Value Ref Range Status   Specimen Description BLOOD RIGHT ANTECUBITAL  Final   Special Requests   Final    BOTTLES DRAWN AEROBIC AND ANAEROBIC Blood Culture adequate volume   Culture   Final    NO GROWTH 5 DAYS Performed at [facility], [address], [place], [place] [address]    Report Status 05/30/2020 FINAL  Final  Body fluid culture     Status: None   Collection Time: 05/26/20   2:30 AM   Specimen: Body Fluid  Result Value Ref Range Status   Specimen Description FLUID PDFL  Final   Special Requests NONE  Final   Gram Stain   Final    ABUNDANT WBC PRESENT, PREDOMINANTLY PMN NO ORGANISMS SEEN    Culture   Final    ABUNDANT ENTEROBACTER CLOACAE CRITICAL RESULT CALLED TO, READ BACK BY AND VERIFIED WITH: DO Brewster Wolters, C. VIA EPIC [phone] FCP Performed at [facility], [place] [address], [place], [place] [address]    Report Status 05/28/2020 FINAL  Final   Organism ID, Bacteria ENTEROBACTER CLOACAE  Final      Susceptibility   Enterobacter cloacae - MIC*    CEFAZOLIN >=64 RESISTANT Resistant     CEFEPIME <=0.12 SENSITIVE Sensitive     CEFTAZIDIME <=1 SENSITIVE Sensitive     CIPROFLOXACIN <=0.25 SENSITIVE Sensitive     GENTAMICIN <=1 SENSITIVE Sensitive  IMIPENEM 1 SENSITIVE Sensitive     TRIMETH/SULFA <=20 SENSITIVE Sensitive     PIP/TAZO <=4 SENSITIVE Sensitive     * ABUNDANT ENTEROBACTER CLOACAE  Culture, blood (Routine X 2) w Reflex to ID Panel     Status: None (Preliminary result)   Collection Time: 05/26/20  3:21 AM   Specimen: BLOOD RIGHT HAND  Result Value Ref Range Status   Specimen Description BLOOD RIGHT HAND  Final   Special Requests   Final    BOTTLES DRAWN AEROBIC AND ANAEROBIC Blood Culture adequate volume   Culture   Final    NO GROWTH 3 DAYS Performed at [facility], [place] [address] [place], [place] [address]    Report Status PENDING  Incomplete  MRSA PCR Screening     Status: None   Collection Time: 05/26/20 10:23 AM   Specimen: Nasopharyngeal  Result Value Ref Range Status   MRSA by PCR NEGATIVE NEGATIVE Final    Comment:        The GeneXpert MRSA Assay (FDA approved for NASAL specimens only), is one component of a comprehensive MRSA colonization surveillance program. It is not intended to diagnose MRSA infection nor to guide or monitor treatment for MRSA infections. Performed at [facility], [place]  [address], [place], [place] [address]   Aerobic/Anaerobic Culture (surgical/deep wound)     Status: None (Preliminary result)   Collection Time: 05/26/20  4:19 PM   Specimen: Abdomen; Wound  Result Value Ref Range Status   Specimen Description ABDOMEN  Final   Special Requests NONE  Final   Gram Stain NO WBC SEEN NO ORGANISMS SEEN   Final   Culture   Final    NO GROWTH 3 DAYS NO ANAEROBES ISOLATED; CULTURE IN PROGRESS FOR 5 DAYS Performed at [facility], [address] [place], [place] [address]    Report Status PENDING  Incomplete      Studies: No results found.  Scheduled Meds: . gentamicin cream  1 application Topical Daily  . torsemide  20 mg Oral Daily  . traZODone  100 mg Oral QHS    Continuous Infusions:    LOS: 5 days     Kayleen Memos, MD [facility] Pager [phone]  If 7PM-7AM, please contact night-coverage [email or web address] Password [facility] 05/30/2020, 4:50 PM

## 2020-05-30 NOTE — Care Management Important Message (Signed)
Important Message  Patient Details  Name: [name] MRN: [id number] Date of Birth: 05-23-1950   Medicare Important Message Given:  Yes     Barb Merino [place] 05/30/2020, 3:27 PM

## 2020-05-30 NOTE — Plan of Care (Signed)
  Problem: Coping: Goal: Level of anxiety will decrease Outcome: Progressing   Problem: Activity: Goal: Risk for activity intolerance will decrease Outcome: Progressing   Problem: Elimination: Goal: Will not experience complications related to bowel motility Outcome: Progressing Goal: Will not experience complications related to urinary retention Outcome: Progressing   Problem: Pain Managment: Goal: General experience of comfort will improve Outcome: Progressing

## 2020-05-30 NOTE — TOC Progression Note (Addendum)
Transition of Care [facility]) - Progression Note    Patient Details  Name: [name] MRN: [id number] Date of Birth: [birth year]  [facility] CM/SW Contact  Sharin Mons, RN Phone Number: 05/30/2020, 3:03 PM  Clinical Narrative:    [facility] still reviewing for acceptance. Currently no bed available... liaison to be in touch if things change. NCM shared  information with pt and pt's daughter. Pt gave NCM the ok to make referral with [facility]. States sister lives in [place]. Referral made with [facility]. [facility] reviewing for acceptance, beds are available. Hoping to d/c on tomorrow once approval received.  TOC team will continue to monitor and assist with TOC need.Marland Kitchen...   05/31/2020 @ 12:30 pm  Awaiting residential hospice approval with [facility]...Marland Kitchenper  [facility] liaison determination should be received today. Still hopeful pt will transition to [facility] today.Marland KitchenMarland KitchenTOC team will continue to monitor.  Expected Discharge Plan: [place] Barriers to Discharge: Hospice Bed not available  Expected Discharge Plan and Services Expected Discharge Plan: [place]         Expected Discharge Date: 05/30/20                                     Social Determinants of Health (SDOH) Interventions    Readmission Risk Interventions No flowsheet data found.

## 2020-05-31 DIAGNOSIS — T8571XA Infection and inflammatory reaction due to peritoneal dialysis catheter, initial encounter: Secondary | ICD-10-CM | POA: Diagnosis not present

## 2020-05-31 LAB — CULTURE, BLOOD (ROUTINE X 2)
Culture: NO GROWTH
Special Requests: ADEQUATE

## 2020-05-31 NOTE — TOC Transition Note (Signed)
Transition of Care [facility]) - CM/SW Discharge Note   Patient Details  Name: [name] MRN: [id number] Date of Birth: 19-Jun-1950  [facility] CM/SW Contact:  Sharin Mons, RN Phone Number: [phone] 05/31/2020, 1:03 PM   Clinical Narrative:    Patient will DC to: [place] Anticipated DC date: 05/31/2020 Family notified: daughter, [name] Transport by: Corey Harold  Approval received from [facility] for  residential hospice.  Per MD patient ready for DC today. RN, patient, and patient's family aware of DC. Pt will transition to residential hospice, [place]. Nurse to call and give report , [phone] prior to d/c. DC packet on chart. Ambulance transport requested for patient.   RNCM will sign off for now as intervention is no longer needed. Please consult [place] again if new needs arise.      RNCM will sign off for now as intervention is no longer needed. Please consult [place] again if new needs arise.  Final next level of care: [place] Barriers to Discharge: No Barriers Identified   Patient Goals and CMS Choice        Discharge Placement                       Discharge Plan and Services                                     Social Determinants of Health (SDOH) Interventions     Readmission Risk Interventions No flowsheet data found.

## 2020-05-31 NOTE — Plan of Care (Signed)

## 2020-05-31 NOTE — Progress Notes (Signed)
This chaplain responded to the unit's page for Pt. spiritual care.  The chaplain understands the Pt. is ready for D/C today to [facility].  The Pt. is awake and shares with the chaplain she is comfortable, with a little gas.  The chaplain and Pt. share stories of family and the Pt. favorite music. A moment opened to listen to [place] by [employer] and the Pt. accepted.  The chaplain accepted the Pt. invitation to come back to visit, if the Pt. is still here tomorrow.

## 2020-05-31 NOTE — Progress Notes (Signed)
Discharge package printed and will send with [facility]. Called [facility] and report given to nurse [facility].

## 2020-05-31 NOTE — Discharge Summary (Signed)
Discharge Summary  [name] CWC:[id number] DOB: 1951/03/18  PCP: Lucia Gaskins, MD  Admit date: 05/25/2020 Discharge date: 05/31/2020  Time spent: 35 minutes  Recommendations for Outpatient Follow-up:  1. Follow-up with hospice care.  2. Medications per residential hospice care.  Discharge Diagnoses:  Active Hospital Problems   Diagnosis Date Noted  . Infection associated with peritoneal dialysis catheter ([place]) 05/25/2020  . DM (diabetes mellitus) ([place]) 07/18/2011  . Mixed hyperlipidemia 09/29/2008  . Essential hypertension 09/29/2008  . Coronary atherosclerosis 09/29/2008    Resolved Hospital Problems  No resolved problems to display.    Discharge Condition: Guarded.  Diet recommendation: Pleasure feedings.  Vitals:   05/30/20 1918 05/31/20 0500  BP: 133/70 107/76  Pulse: (!) 108 100  Resp: 18 16  Temp: 97.9 F (36.6 C) 98.4 F (36.9 C)  SpO2: 90% 98%    History of present illness:  [name] a 70 y.o.femalewithhistory of ESRD recently on peritoneal dialysis was experiencing fluid return from her peritoneal catheter with increasing abdominal pain mostly in the left lower quadrant 24 hours prior to her presentation. Associated symptoms included chills and bloody discharge coming from the dialysis cath.   ED Course:Initially presented to the ER at [facility], CT abdomen pelvis without contrast showed features concerning for possible hematoma versus abscess in the deep portion of the peritoneal dialysis catheter region (left lower abdominal quadrant). Nephrology was consulted requested, blood cultures were obtained and IV antibiotics were started empirically.  Patient was transferred to [facility] for further management. COVID screening test was negative. Labs were significant for WBC count of 14.7K hemoglobin of 8.9K potassium was 3.7.   She was seen by nephrology with plan to continue PD using outpatient prescription but at 2.5%  dextrose.  She was also seen by general surgery and IR.  She had image guided aspiration of anterior abdominal wall fluid on 05/26/2020 by interventional radiology Dr. Earleen Newport.  This revealed old blood products more consistent with hematoma.    On 05/27/2020 patient made decision for comfort care measures.  She requested to stop hemodialysis.  She has declined any further blood draws or antibiotics or treatment to prolong her life.  She is alert and oriented x4.  CODE STATUS changed to DNR at patient's request and comfort care orders placed on 05/27/2020.    Seen by palliative care team on 05/28/2020, for comfort care.  Comfort and dignity at end-of-life at residential hospice.  Appreciate TOC's assistance with placement.   05/31/20:  Seen at her bedside.  She has no other complaints.  Hospital Course:  Principal Problem:   Infection associated with peritoneal dialysis catheter [facility]) Active Problems:   Mixed hyperlipidemia   Essential hypertension   Coronary atherosclerosis   DM (diabetes mellitus) ([place])  Assessment: -Presumptive peritonitis in the setting of peritoneal dialysis. -ESRD on PD, stopped on 05/27/2020 at patient's own request. -Anemia of chronic disease in the setting of ESRD. -Type 2 diabetes uncontrolled with hyperglycemia. -Essential hypertension. -Hyperlipidemia. -Physical debility/ambulatory dysfunction. -DNR. -Comfort care.  Plan: Focus on comfort. Comfort and dignity at end-of-life at residential hospice.  Code Status: DNR/comfort care.   Consultants:  Nephrology  Palliative care team.  TOC to assist with hospice placement.  Procedures:  PD   Image guided aspiration of anterior abdominal wall fluid on 05/26/2020 by interventional radiology   Antimicrobials:  Cefepime 05/25/2020>> 05/27/2020  IV vancomycin 05/25/2020>> 05/27/2020   Discharge Exam: BP 107/76 (BP Location: Left Arm)   Pulse 100  Temp 98.4 F (36.9 C) (Oral)   Resp 16   Ht 5'  (1.524 m)   Wt 84.9 kg   SpO2 98%   BMI 36.55 kg/m  . General: 70 y.o. year-old female well developed well nourished in no acute distress.  Alert and oriented x3. . Cardiovascular: Regular rate and rhythm with no rubs or gallops.  No thyromegaly or JVD noted.   Marland Kitchen Respiratory: Clear to auscultation with no wheezes or rales. Good inspiratory effort. . Abdomen: Soft nontender nondistended with normal bowel sounds x4 quadrants. . Musculoskeletal: No lower extremity edema bilaterally.   Marland Kitchen Psychiatry: Mood is appropriate for condition and setting  Discharge Instructions You were cared for by a hospitalist during your hospital stay. If you have any questions about your discharge medications or the care you received while you were in the hospital after you are discharged, you can call the unit and asked to speak with the hospitalist on call if the hospitalist that took care of you is not available. Once you are discharged, your primary care physician will handle any further medical issues. Please note that NO REFILLS for any discharge medications will be authorized once you are discharged, as it is imperative that you return to your primary care physician (or establish a relationship with a primary care physician if you do not have one) for your aftercare needs so that they can reassess your need for medications and monitor your lab values.   Allergies as of 05/31/2020      Reactions   Insect Extract Allergy Skin Test Swelling   Other    Optical meds unknown to pt   Propine [dipivefrin] Itching, Other (See Comments)   Makes eyes turn a reddish orange color.   Travatan [travoprost] Itching, Other (See Comments)   Makes eyes turn a reddish orange color   Xalatan [latanoprost] Itching, Other (See Comments)   Makes eyes turn a reddish orange color.       Medication List    STOP taking these medications   amLODipine 10 MG tablet Commonly known as: NORVASC   amLODipine 5 MG tablet Commonly known  as: NORVASC   aspirin EC 81 MG tablet   B-complex with vitamin C tablet   brimonidine 0.2 % ophthalmic solution Commonly known as: ALPHAGAN   cloNIDine 0.2 MG tablet Commonly known as: CATAPRES   Co Q 10 100 MG Caps   DECONGESTANT INHALER IN   ezetimibe-simvastatin 10-20 MG tablet Commonly known as: VYTORIN   hydrALAZINE 50 MG tablet Commonly known as: APRESOLINE   HYDROcodone-acetaminophen 5-325 MG tablet Commonly known as: Norco   HYDROmorphone 4 MG tablet Commonly known as: DILAUDID   insulin glargine 100 UNIT/ML injection Commonly known as: LANTUS   isosorbide mononitrate 30 MG 24 hr tablet Commonly known as: IMDUR   metoprolol succinate 100 MG 24 hr tablet Commonly known as: TOPROL-XL   multivitamin tablet   nitroGLYCERIN 0.4 MG SL tablet Commonly known as: NITROSTAT   nystatin 100000 UNIT/ML suspension Commonly known as: MYCOSTATIN   PROBIOTIC DAILY PO   ramipril 10 MG capsule Commonly known as: ALTACE   senna 8.6 MG tablet Commonly known as: SENOKOT   Spirulina 500 MG Tabs   timolol 0.5 % ophthalmic solution Commonly known as: TIMOPTIC   torsemide 20 MG tablet Commonly known as: DEMADEX   VITAMIN D PO      Allergies  Allergen Reactions  . Insect Extract Allergy Skin Test Swelling  . Other     Optical  meds unknown to pt  . Propine [Dipivefrin] Itching and Other (See Comments)    Makes eyes turn a reddish orange color.  . Travatan [Travoprost] Itching and Other (See Comments)    Makes eyes turn a reddish orange color  . Xalatan [Latanoprost] Itching and Other (See Comments)    Makes eyes turn a reddish orange color.       The results of significant diagnostics from this hospitalization (including imaging, microbiology, ancillary and laboratory) are listed below for reference.    Significant Diagnostic Studies: CT ABDOMEN PELVIS WO CONTRAST  Result Date: 05/25/2020 CLINICAL DATA:  Bleeding during peritoneal hemodialysis. Lower  abdominal pain. EXAM: CT ABDOMEN AND PELVIS WITHOUT CONTRAST TECHNIQUE: Multidetector CT imaging of the abdomen and pelvis was performed following the standard protocol without IV contrast. COMPARISON:  09/14/2016 FINDINGS: Lower chest: Circumflex and right coronary artery atherosclerotic calcification mild cardiomegaly. C bandlike densities in the lingula, lower lobes, and right middle lobe some of which likely represents scarring but a component may reflect atelectasis. Hepatobiliary: Small layering gallstones in the gallbladder is shown on image 28 series 2. Mildly prominent gallbladder caliber. Pancreas: Unremarkable Spleen: Unremarkable Adrenals/Urinary Tract: 1.1 by 1.5 cm nonspecific nodule of the medial limb of the left adrenal gland, unchanged from 2018 hence likely benign. Various renal lesions are present, some of which are complex bilaterally a left kidney lower pole exophytic mildly hyperdense lesion measures 2.6 cm transverse, previously 1.8 cm transverse. Strictly speaking, renal masses are not excluded. No nephrolithiasis identified. Stomach/Bowel: Mild wall thickening in the ascending colon, potentially partially from nondistention and fatty deposition in the colon wall, although a component of colitis is difficult to exclude. Appendix unremarkable. No dilated small bowel identified. Vascular/Lymphatic: Aortoiliac atherosclerotic vascular disease. No pathologic adenopathy identified. Reproductive: Uterus absent. Adnexa unremarkable. Other: Notable subcutaneous and flank edema. Small amount of ascites in the left lower quadrant around the peritoneal dialysis catheter looped. Ascites has a density of about 13 Hounsfield units. Mild mesenteric and omental edema along with mild presacral edema. Along the deep subcutaneous portion of the peritoneal dialysis catheter, there is an indistinctly marginated 2.7 by 3.4 by 3.1 cm collection of fluid which could be abscess or hematoma, as shown on image 36 of  series 5. Surrounding mildly confluent inflammatory stranding in this vicinity. Musculoskeletal: Thoracic and lumbar spondylosis. IMPRESSION: 1. Along the deep subcutaneous portion of the peritoneal dialysis catheter, there is an indistinctly marginated 2.7 by 3.4 by 3.1 cm collection of fluid which could be abscess or hematoma. Surrounding mildly confluent inflammatory stranding in this vicinity. 2. Small amount of ascites in the left lower quadrant around the peritoneal dialysis catheter looped. 3. Mild wall thickening in the ascending colon, potentially partially from nondistention and fatty deposition in the colon wall, although a component of colitis is difficult to exclude. 4. Various renal lesions are present, some of which are complex bilaterally. Strictly speaking, renal masses are not excluded. 5. Other imaging findings of potential clinical significance: Coronary atherosclerosis with mild cardiomegaly. Cholelithiasis. Stable nonspecific 1.1 by 1.5 cm nodule of the medial limb of the left adrenal gland, unchanged from 2018 hence likely benign. C bandlike densities in the lingula, lower lobes, and right middle lobe some of which likely represents scarring but a component may reflect atelectasis. 6. Aortic atherosclerosis. Aortic Atherosclerosis (ICD10-I70.0). Electronically Signed   By: Van Clines M.D.   On: 05/25/2020 16:43   [place] FINE NEEDLE ASP 1ST LESION  Result Date: 05/27/2020 INDICATION: 70 year old female with a  history of fluid along peritoneal dialysis catheter EXAM: ULTRASOUND-GUIDED ASPIRATION MEDICATIONS: None ANESTHESIA/SEDATION: None COMPLICATIONS: None PROCEDURE: Informed written consent was obtained from the patient after a thorough discussion of the procedural risks, benefits and alternatives. All questions were addressed. Maximal Sterile Barrier Technique was utilized including caps, mask, sterile gowns, sterile gloves, sterile drape, hand hygiene and skin antiseptic. A timeout  was performed prior to the initiation of the procedure. Patient is prepped and draped in the usual sterile fashion. 1% lidocaine was used for local anesthesia. Scout ultrasound images were acquired. Using a Yueh needle, small aspirate was acquired of the complex fluid along the tract of the catheter. 1-2 cc of bloody fluid aspirated. Sample was sent for culture. Patient tolerated the procedure well and remained hemodynamically stable throughout. No complications were encountered and no significant blood loss. IMPRESSION: Ultrasound-guided aspirate of complex fluid/tissue along the tract of the catheter with small sample sent for culture. Signed, Dulcy Fanny. Dellia Nims, RPVI [facility] Electronically Signed   By: Corrie Mckusick D.O.   On: 05/27/2020 08:05    Microbiology: Recent Results (from the past 240 hour(s))  Resp Panel by RT-PCR (Flu A&B, Covid) Nasopharyngeal Swab     Status: None   Collection Time: 05/25/20  4:00 PM   Specimen: Nasopharyngeal Swab; Nasopharyngeal(NP) swabs in vial transport medium  Result Value Ref Range Status   SARS Coronavirus 2 by RT PCR NEGATIVE NEGATIVE Final    Comment: (NOTE) SARS-CoV-2 target nucleic acids are NOT DETECTED.  The SARS-CoV-2 RNA is generally detectable in upper respiratory specimens during the acute phase of infection. The lowest concentration of SARS-CoV-2 viral copies this assay can detect is 138 copies/mL. A negative result does not preclude SARS-Cov-2 infection and should not be used as the sole basis for treatment or other patient management decisions. A negative result may occur with  improper specimen collection/handling, submission of specimen other than nasopharyngeal swab, presence of viral mutation(s) within the areas targeted by this assay, and inadequate number of viral copies(<138 copies/mL). A negative result must be combined with clinical observations, patient history, and  epidemiological information. The expected result is Negative.  Fact Sheet for Patients:  EntrepreneurPulse.com.au  Fact Sheet for Healthcare Providers:  IncredibleEmployment.be  This test is no t yet approved or cleared by the [place] FDA and  has been authorized for detection and/or diagnosis of SARS-CoV-2 by FDA under an Emergency Use Authorization (EUA). This EUA will remain  in effect (meaning this test can be used) for the duration of the COVID-19 declaration under Section 564(b)(1) of the Act, 21 U.S.C.section 360bbb-3(b)(1), unless the authorization is terminated  or revoked sooner.       Influenza A by PCR NEGATIVE NEGATIVE Final   Influenza B by PCR NEGATIVE NEGATIVE Final    Comment: (NOTE) The Xpert Xpress SARS-CoV-2/FLU/RSV plus assay is intended as an aid in the diagnosis of influenza from Nasopharyngeal swab specimens and should not be used as a sole basis for treatment. Nasal washings and aspirates are unacceptable for Xpert Xpress SARS-CoV-2/FLU/RSV testing.  Fact Sheet for Patients: EntrepreneurPulse.com.au  Fact Sheet for Healthcare Providers: IncredibleEmployment.be  This test is not yet approved or cleared by the [place] FDA and has been authorized for detection and/or diagnosis of SARS-CoV-2 by FDA under an Emergency Use Authorization (EUA). This EUA will remain in effect (meaning this test can be used) for the duration of the COVID-19 declaration under Section 564(b)(1) of the Act, 21 U.S.C.  section 360bbb-3(b)(1), unless the authorization is terminated or revoked.  Performed at [facility], [address], [place], [place] [address]   Blood culture (routine x 2)     Status: None   Collection Time: 05/25/20  6:35 PM   Specimen: BLOOD  Result Value Ref Range Status   Specimen Description BLOOD RIGHT ANTECUBITAL  Final   Special Requests   Final    BOTTLES DRAWN  AEROBIC AND ANAEROBIC Blood Culture adequate volume   Culture   Final    NO GROWTH 5 DAYS Performed at [facility], [address] [place], [place] [address]    Report Status 05/30/2020 FINAL  Final  Body fluid culture     Status: None   Collection Time: 05/26/20  2:30 AM   Specimen: Body Fluid  Result Value Ref Range Status   Specimen Description FLUID PDFL  Final   Special Requests NONE  Final   Gram Stain   Final    ABUNDANT WBC PRESENT, PREDOMINANTLY PMN NO ORGANISMS SEEN    Culture   Final    ABUNDANT ENTEROBACTER CLOACAE CRITICAL RESULT CALLED TO, READ BACK BY AND VERIFIED WITH: DO Mairany Bruno, C. VIA EPIC [phone] FCP Performed at [facility], [place] [address] [place], [place] [address]    Report Status 05/28/2020 FINAL  Final   Organism ID, Bacteria ENTEROBACTER CLOACAE  Final      Susceptibility   Enterobacter cloacae - MIC*    CEFAZOLIN >=64 RESISTANT Resistant     CEFEPIME <=0.12 SENSITIVE Sensitive     CEFTAZIDIME <=1 SENSITIVE Sensitive     CIPROFLOXACIN <=0.25 SENSITIVE Sensitive     GENTAMICIN <=1 SENSITIVE Sensitive     IMIPENEM 1 SENSITIVE Sensitive     TRIMETH/SULFA <=20 SENSITIVE Sensitive     PIP/TAZO <=4 SENSITIVE Sensitive     * ABUNDANT ENTEROBACTER CLOACAE  Culture, blood (Routine X 2) w Reflex to ID Panel     Status: None (Preliminary result)   Collection Time: 05/26/20  3:21 AM   Specimen: BLOOD RIGHT HAND  Result Value Ref Range Status   Specimen Description BLOOD RIGHT HAND  Final   Special Requests   Final    BOTTLES DRAWN AEROBIC AND ANAEROBIC Blood Culture adequate volume   Culture   Final    NO GROWTH 4 DAYS Performed at [facility], [address], [place], [place] [address]    Report Status PENDING  Incomplete  MRSA PCR Screening     Status: None   Collection Time: 05/26/20 10:23 AM   Specimen: Nasopharyngeal  Result Value Ref Range Status   MRSA by PCR NEGATIVE NEGATIVE Final    Comment:        The GeneXpert MRSA Assay  (FDA approved for NASAL specimens only), is one component of a comprehensive MRSA colonization surveillance program. It is not intended to diagnose MRSA infection nor to guide or monitor treatment for MRSA infections. Performed at [facility], [place] [address] [place], [place] [address]   Aerobic/Anaerobic Culture (surgical/deep wound)     Status: None (Preliminary result)   Collection Time: 05/26/20  4:19 PM   Specimen: Abdomen; Wound  Result Value Ref Range Status   Specimen Description ABDOMEN  Final   Special Requests NONE  Final   Gram Stain NO WBC SEEN NO ORGANISMS SEEN   Final   Culture   Final    NO GROWTH 4 DAYS NO ANAEROBES ISOLATED; CULTURE IN PROGRESS FOR 5  DAYS Performed at [facility], [place] [address] [place], [place] [address]    Report Status PENDING  Incomplete     Labs: Basic Metabolic Panel: Recent Labs  Lab 05/25/20 1835 05/26/20 0321  NA 137 137  K 3.7 3.6  CL 97* 99  CO2 29 26  GLUCOSE 63* 135*  BUN 65* 64*  CREATININE 9.28* 9.62*  CALCIUM 8.2* 7.8*   Liver Function Tests: Recent Labs  Lab 05/25/20 1835 05/26/20 0321  AST 19 16  ALT 17 15  ALKPHOS 46 41  BILITOT 0.2* 0.5  PROT 5.7* 4.7*  ALBUMIN 1.8* 1.4*   Recent Labs  Lab 05/25/20 1835  LIPASE 18   No results for input(s): AMMONIA in the last 168 hours. CBC: Recent Labs  Lab 05/25/20 1835 05/26/20 0321  WBC 14.7* 10.6*  NEUTROABS 11.9*  --   HGB 8.9* 7.6*  HCT 28.7* 23.7*  MCV 97.3 96.0  PLT 427* 340   Cardiac Enzymes: No results for input(s): CKTOTAL, CKMB, CKMBINDEX, TROPONINI in the last 168 hours. BNP: BNP (last 3 results) No results for input(s): BNP in the last 8760 hours.  ProBNP (last 3 results) No results for input(s): PROBNP in the last 8760 hours.  CBG: Recent Labs  Lab 05/27/20 0642 05/27/20 2144 05/27/20 2258 05/28/20 0647 05/28/20 0805  GLUCAP 106* 42* 64* 38* 60*       Signed:  Kayleen Memos, MD [facility] 05/31/2020, 1:25 PM

## 2020-05-31 NOTE — Plan of Care (Signed)
  Problem: Activity: Goal: Risk for activity intolerance will decrease Outcome: Progressing   Problem: Nutrition: Goal: Adequate nutrition will be maintained Outcome: Progressing   Problem: Safety: Goal: Ability to remain free from injury will improve Outcome: Progressing   Problem: Pain Managment: Goal: General experience of comfort will improve Outcome: Progressing   

## 2020-06-01 LAB — AEROBIC/ANAEROBIC CULTURE W GRAM STAIN (SURGICAL/DEEP WOUND)
Culture: NO GROWTH
Gram Stain: NONE SEEN

## 2020-07-05 DEATH — deceased

## 2020-10-26 ENCOUNTER — Ambulatory Visit: Payer: Medicare Other | Admitting: Cardiovascular Disease
